# Patient Record
Sex: Female | Born: 1935 | Race: White | Hispanic: No | Smoking: Never smoker
Health system: Southern US, Community
[De-identification: ages and names within clinical notes are randomized; demographics above are authoritative.]

## PROBLEM LIST (undated history)

## (undated) DIAGNOSIS — Z86718 Personal history of other venous thrombosis and embolism: Secondary | ICD-10-CM

## (undated) DIAGNOSIS — I10 Essential (primary) hypertension: Secondary | ICD-10-CM

## (undated) DIAGNOSIS — E785 Hyperlipidemia, unspecified: Secondary | ICD-10-CM

## (undated) DIAGNOSIS — M810 Age-related osteoporosis without current pathological fracture: Secondary | ICD-10-CM

## (undated) DIAGNOSIS — Z86711 Personal history of pulmonary embolism: Secondary | ICD-10-CM

## (undated) DIAGNOSIS — F0390 Unspecified dementia without behavioral disturbance: Secondary | ICD-10-CM

## (undated) DIAGNOSIS — I509 Heart failure, unspecified: Secondary | ICD-10-CM

## (undated) DIAGNOSIS — K219 Gastro-esophageal reflux disease without esophagitis: Secondary | ICD-10-CM

## (undated) HISTORY — PX: TOTAL HIP ARTHROPLASTY: SHX124

---

## 2005-01-21 ENCOUNTER — Inpatient Hospital Stay: Payer: Self-pay | Admitting: General Practice

## 2006-08-24 ENCOUNTER — Ambulatory Visit: Payer: Self-pay | Admitting: Gastroenterology

## 2008-10-15 ENCOUNTER — Inpatient Hospital Stay: Payer: Self-pay | Admitting: Specialist

## 2010-02-11 ENCOUNTER — Inpatient Hospital Stay: Payer: Self-pay | Admitting: Internal Medicine

## 2010-03-28 ENCOUNTER — Ambulatory Visit: Payer: Self-pay | Admitting: Unknown Physician Specialty

## 2010-04-21 ENCOUNTER — Inpatient Hospital Stay: Payer: Self-pay | Admitting: *Deleted

## 2010-04-22 ENCOUNTER — Ambulatory Visit: Payer: Self-pay | Admitting: Internal Medicine

## 2010-04-24 LAB — CANCER ANTIGEN 19-9: CA 19-9: 14 U/mL (ref 0–35)

## 2010-05-06 ENCOUNTER — Ambulatory Visit: Payer: Self-pay | Admitting: Internal Medicine

## 2010-05-18 ENCOUNTER — Ambulatory Visit: Payer: Self-pay | Admitting: Internal Medicine

## 2010-10-28 ENCOUNTER — Encounter: Payer: Self-pay | Admitting: Otolaryngology

## 2010-11-17 ENCOUNTER — Encounter: Payer: Self-pay | Admitting: Otolaryngology

## 2011-05-12 ENCOUNTER — Ambulatory Visit: Payer: Self-pay | Admitting: General Practice

## 2011-05-12 DIAGNOSIS — I10 Essential (primary) hypertension: Secondary | ICD-10-CM

## 2011-05-12 LAB — BASIC METABOLIC PANEL
Anion Gap: 12 (ref 7–16)
BUN: 23 mg/dL — ABNORMAL HIGH (ref 7–18)
Chloride: 106 mmol/L (ref 98–107)
Co2: 27 mmol/L (ref 21–32)
Creatinine: 1.24 mg/dL (ref 0.60–1.30)
Glucose: 103 mg/dL — ABNORMAL HIGH (ref 65–99)
Osmolality: 293 (ref 275–301)
Sodium: 145 mmol/L (ref 136–145)

## 2011-05-12 LAB — CBC
HCT: 36.5 % (ref 35.0–47.0)
MCH: 27.4 pg (ref 26.0–34.0)
MCHC: 32.6 g/dL (ref 32.0–36.0)
MCV: 84 fL (ref 80–100)
Platelet: 238 10*3/uL (ref 150–440)
WBC: 6.6 10*3/uL (ref 3.6–11.0)

## 2011-05-12 LAB — PROTIME-INR
INR: 0.9
Prothrombin Time: 12.6 secs (ref 11.5–14.7)

## 2011-05-12 LAB — SEDIMENTATION RATE: Erythrocyte Sed Rate: 17 mm/hr (ref 0–30)

## 2011-05-25 ENCOUNTER — Inpatient Hospital Stay: Payer: Self-pay | Admitting: General Practice

## 2011-05-26 LAB — BASIC METABOLIC PANEL
Chloride: 109 mmol/L — ABNORMAL HIGH (ref 98–107)
Co2: 26 mmol/L (ref 21–32)
Creatinine: 1.03 mg/dL (ref 0.60–1.30)
EGFR (African American): >60
Potassium: 3.8 mmol/L (ref 3.5–5.1)
Sodium: 142 mmol/L (ref 136–145)

## 2011-05-26 LAB — TSH: Thyroid Stimulating Horm: 5.94 u[IU]/mL — ABNORMAL HIGH

## 2011-05-27 LAB — BASIC METABOLIC PANEL
Anion Gap: 9 (ref 7–16)
BUN: 18 mg/dL (ref 7–18)
Calcium, Total: 7.7 mg/dL — ABNORMAL LOW (ref 8.5–10.1)
Chloride: 111 mmol/L — ABNORMAL HIGH (ref 98–107)
Creatinine: 1.03 mg/dL (ref 0.60–1.30)
EGFR (African American): >60
Osmolality: 284 (ref 275–301)
Potassium: 3.5 mmol/L (ref 3.5–5.1)

## 2011-05-27 LAB — HEMOGLOBIN: HGB: 9.3 g/dL — ABNORMAL LOW (ref 12.0–16.0)

## 2011-10-25 ENCOUNTER — Emergency Department: Payer: Self-pay | Admitting: Unknown Physician Specialty

## 2011-10-25 ENCOUNTER — Ambulatory Visit: Payer: Self-pay | Admitting: Orthopaedic Surgery

## 2011-10-25 LAB — URINALYSIS, COMPLETE
Bilirubin,UR: NEGATIVE
Blood: NEGATIVE
Glucose,UR: NEGATIVE mg/dL (ref 0–75)
Hyaline Cast: 4
Ketone: NEGATIVE
Nitrite: NEGATIVE
Ph: 5 (ref 4.5–8.0)
RBC,UR: 3 /HPF (ref 0–5)
Squamous Epithelial: <1
WBC UR: 13 /HPF (ref 0–5)

## 2011-10-25 LAB — CBC
HCT: 34.1 % — ABNORMAL LOW (ref 35.0–47.0)
HGB: 11 g/dL — ABNORMAL LOW (ref 12.0–16.0)
MCH: 24.8 pg — ABNORMAL LOW (ref 26.0–34.0)
MCV: 77 fL — ABNORMAL LOW (ref 80–100)
Platelet: 241 10*3/uL (ref 150–440)
RDW: 19.6 % — ABNORMAL HIGH (ref 11.5–14.5)
WBC: 7.8 10*3/uL (ref 3.6–11.0)

## 2011-10-25 LAB — COMPREHENSIVE METABOLIC PANEL
Albumin: 3.8 g/dL (ref 3.4–5.0)
Alkaline Phosphatase: 85 U/L (ref 50–136)
Anion Gap: 7 (ref 7–16)
BUN: 31 mg/dL — ABNORMAL HIGH (ref 7–18)
Bilirubin,Total: 0.6 mg/dL (ref 0.2–1.0)
Calcium, Total: 9.2 mg/dL (ref 8.5–10.1)
Creatinine: 1.34 mg/dL — ABNORMAL HIGH (ref 0.60–1.30)
Glucose: 101 mg/dL — ABNORMAL HIGH (ref 65–99)
Osmolality: 284 (ref 275–301)
Potassium: 3.9 mmol/L (ref 3.5–5.1)
Sodium: 139 mmol/L (ref 136–145)
Total Protein: 7.1 g/dL (ref 6.4–8.2)

## 2012-12-16 ENCOUNTER — Emergency Department: Payer: Self-pay | Admitting: Emergency Medicine

## 2013-09-22 ENCOUNTER — Other Ambulatory Visit: Payer: Self-pay | Admitting: Podiatry

## 2013-10-16 LAB — WOUND CULTURE

## 2014-03-23 DIAGNOSIS — E039 Hypothyroidism, unspecified: Secondary | ICD-10-CM | POA: Insufficient documentation

## 2014-03-23 DIAGNOSIS — E785 Hyperlipidemia, unspecified: Secondary | ICD-10-CM | POA: Insufficient documentation

## 2014-03-23 DIAGNOSIS — I10 Essential (primary) hypertension: Secondary | ICD-10-CM | POA: Insufficient documentation

## 2014-06-05 NOTE — Consult Note (Signed)
Brief Consult Note: Diagnosis: Right hip dislocation.   Patient was seen by consultant.   Consult note dictated.   Comments: 81 yo female s/p right THA by Dr. Ernest PineHooten in 2006; this is her second dislocation; first was in 2010; she was just sitting on the edge of her bed trying to get out of bed and felt the hip "slip" then dislocate; brought to ED found to have a posterior right hip dislocation; i was consulted; she denies fever chills or other signs of infection; denies any other truama; she denies open wound or bleeding  Right hip in short and IR post pf / df/ ehl sensation intact to light touch cap refill < 2secs  xray--right posterior hip dislocation  A/P [age] yo female with right hip dislocation s/p THA sedate and reduce here in ED knee immobilizer fu Dr. Ernest PineHooten next week for eval and possible change of brace  posterior hip precautions post reduction.  Electronic Signatures: Francis Gainesalderon, Sayan Aldava D (MD)  (Signed 08-Sep-13 13:20)  Authored: Brief Consult Note   Last Updated: 08-Sep-13 13:20 by Francis Gainesalderon, Tajuana Kniskern D (MD)

## 2014-06-05 NOTE — Consult Note (Signed)
PATIENT NAME:  Megan Donaldson, Megan Donaldson MR#:  [id number] DATE OF BIRTH:  07-01-1927  DATE OF CONSULTATION:  10/25/2011  CONSULTING PHYSICIAN:  Dimas Alexandria. Jeanelle Malling, MD  CHIEF COMPLAINT: Right hip pain.   HISTORY OF PRESENT ILLNESS: The patient is a very pleasant 81 year old female who was in her usual state of health up until earlier this morning when she was trying to get out of bed and suffered what she felt like a slip and a pop in her right hip. She was unable to ambulate and had significant increase in right hip pain. She is status post right total hip arthroplasty done in 2006. She has had one previous dislocation of the right hip in 2010. The hip surgery was done by Dr. Ernest Pine. On evaluation here in the Emergency Department, x-rays were done. She was found to have a posterior superior dislocation of her right total hip arthroplasty, and I was consulted. On discussing with the patient, we discussed the risks, benefits, and alternatives to a closed reduction under conscious sedation. She had an opportunity to ask questions, was given satisfactory answers, and then agreed to a closed reduction.   PAST MEDICAL HISTORY: Significant for: 1. Hypercholesterolemia.  2. GERD. 3. Hypertension.   CURRENT MEDICATIONS: Current medications include Tylenol, Systane, simvastatin, Senna, pantoprazole, oxycodone, metoprolol, Lovenox, levothyroxine, hydrochlorothiazide, Dulcolax, Cipro and a baby aspirin.   ALLERGIES: . She has allergies to lisinopril, latex and tape.   REVIEW OF SYSTEMS: HEENT: She denies blurred vision. She denies hearing loss. CARDIORESPIRATORY: She denies shortness of breath or chest pain. GASTROINTESTINAL: She denies nausea or vomiting. MUSCULOSKELETAL: Review of systems is in accordance with the above noted exceptions.  NEUROLOGICAL: She denies numbness or weakness.   PHYSICAL EXAMINATION:  VITAL SIGNS: She is afebrile and her vital signs are stable.   HEENT: Pupils are equal, round, and  reactive to light. Her mucous membranes are moist.   NECK: Her neck is supple. No JVD. Trachea is midline.   LUNGS: her lungs are clear to auscultation bilaterally.   CARDIOVASCULAR: She has no murmurs, gallops, or rubs. Regular rate and rhythm.   ABDOMEN: She has a soft abdomen, nondistended, nontender.   EXTREMITIES: Examination of the right lower extremity shows a shortened internally rotated right lower extremity with significant pain with any motion about the hip. Her sensation is intact to light touch throughout the right lower extremity. She has positive plantar flexion, dorsiflexion and EHL function in the right lower extremity. She has capillary refill less than 2 seconds. There are no open wounds or skin lesions noted around the hip, and her incision from her previous surgical replacement of her right hip is well healed.   LABORATORY, DIAGNOSTIC AND RADIOLOGICAL DATA: X-rays of the right hip and pelvis demonstrate a posterior superior dislocation of the right total hip arthroplasty. Labs: Her urinalysis is equivocal, questionable presence of a urinary tract infection. Her troponins are negative. CBC: Her white count is 7.8, her hemoglobin and hematocrit is 11/34, and her platelets are 241.0. Metabolic panel: Her sodium is 045, potassium 3.9, chloride 105, bicarbonate 27, BUN 31, and creatinine 1.3.   ASSESSMENT: This is a very pleasant 81 year old female with a right hip dislocation, status post right total hip arthroplasty.   PLAN: We will conduct conscious sedation here in the Emergency Department. I will attempt a closed reduction. We will place her in a right knee immobilizer to try to help prevent hip flexion and repeat dislocation. We will put her on posterior hip precautions,  and she will follow-up with Dr. Ernest PineHooten next week for further evaluation and possible changing of her brace to something better suited for a hip dislocation.   PROCEDURE NOTE: Prior to obtaining verbal and  written consent for the closed reduction of the right hip dislocation, we discussed the risks, benefits, and alternatives. She had an opportunity to ask questions, was given satisfactory answers, and signed all appropriate consents.   The Emergency Department personnel next conducted conscious sedation. Once the patient was adequately sedated, I completed a closed reduction of the right hip with flexion, internal rotation, traction and external rotation. There was a significant pop with the reduction. Post reduction, she had equal leg lengths; and capillary refill remained less than 2 seconds throughout the right lower extremity. A post reduction physical exam is pending her waking up from conscious sedation.   COMPLICATIONS: None. The patient tolerated the procedure well.  ____________________________ Dimas Alexandriaoberto D. Jeanelle Mallingalderon, MD rdc:cbb D: 10/25/2011 13:37:20 ET T: 10/25/2011 14:47:40 ET JOB#: [id number]  cc: Dimas Alexandriaoberto D. Jeanelle Mallingalderon, MD, <Dictator> Francis GainesOBERTO D Croix Presley MD ELECTRONICALLY SIGNED 10/29/2011 13:22

## 2014-06-08 NOTE — Op Note (Signed)
PATIENT NAME:  Megan Donaldson, Megan Donaldson MR#:  [id number] DATE OF BIRTH:  12-24-1927  DATE OF PROCEDURE:  12/16/2012  PREOPERATIVE DIAGNOSIS:  Dislocated right total hip.   POSTOPERATIVE DIAGNOSIS:  Dislocated right total hip.   PROCEDURE:  Closed reduction, right hip.   ANESTHESIA:  IV sedation given by ER physician.   DESCRIPTION OF PROCEDURE:  After patient identification was made, the hip was reduced after sedation given with longitudinal traction with the knee and hip in flexion. An audible and palpable reduction was obtained, and leg lengths appeared nearly equal. Postreduction x-ray pending.   COMPLICATIONS:  None.   SPECIMEN:  None.   ESTIMATED BLOOD LOSS:  None.     ____________________________ Leitha SchullerMichael J. Aowyn Rozeboom, MD mjm:ms D: 12/16/2012 20:41:15 ET T: 12/16/2012 23:13:04 ET JOB#: [id number]  cc: Leitha SchullerMichael J. Virgene Tirone, MD, <Dictator> Leitha SchullerMICHAEL J Chantelle Verdi MD ELECTRONICALLY SIGNED 12/18/2012 14:35

## 2014-06-10 NOTE — Discharge Summary (Signed)
PATIENT NAME:  Megan Donaldson, Megan Donaldson MR#:  [id number] DATE OF BIRTH:  1927-05-01  DATE OF ADMISSION:  05/25/2011 DATE OF DISCHARGE:  05/28/2011  ADMITTING DIAGNOSIS: Degenerative arthrosis of the left knee.   DISCHARGE DIAGNOSES:  1. Degenerative arthrosis of the left knee. 2. Hypothermia. 3. Hypertension.   HISTORY: The patient is an 81 year old who has [facility] for progression of left knee pain. The patient had been experiencing pain to bilateral knees with the left being greater than the right for quite some time. The patient states that the pain was aggravated with prolonged standing. She had gotten to the point where she was avoiding stairs due to the increased discomfort. She was typically using a cane or walker for ambulation due to the severe knee pain prior to surgery. She reported pain both the medial and lateral aspects of the knees. She has also reported crepitus as well as some occasional swelling of the knee, which was felt to be secondary to activity. She has had some episodes of near giving way of the knee. She stated that she no longer had seen any significant improvement in her condition despite use of intra-articular cortisone injections as well as Synvisc injections. She has been unable to take anti-inflammatories due to history of gastritis and GI bleed. The left knee pain had progressed to the point that it was significantly interfering with her activities of daily living. X-rays taken in the clinic showed narrowing of the medial and lateral cartilage space. Osteophytes as well as subchondral sclerosis was noted. After discussion of the risks and benefits of surgical intervention, the patient expressed her understanding of the risks and benefits and agreed with  plans for surgical intervention.   PROCEDURE: Left total knee arthroplasty using computer-assisted navigation.   ANESTHESIA: Femoral nerve block with spinal.   SOFT TISSUE RELEASES: Anterior cruciate  ligament, posterior cruciate ligament, and deep medial collateral ligaments, as well as the patellofemoral ligament.   IMPLANTS UTILIZED: DePuy PFC Sigma size 3 posterior stabilized femoral component (cemented), size 3 MBT tibial component (cemented), 32 mm three pegged oval dome patella (cemented), and a 12.5 mm stabilized rotating platform polyethylene insert.   HOSPITAL COURSE: The patient tolerated the procedure very well. She had no complications. She was then taken to the PAC-U where she was stabilized. She was then transferred to the orthopedic floor. However, once reaching the orthopedic floor the patient became hypotensive and as well as had hypothermia down to as low as 94.3. A warming blanket was applied. She was given a bolus which increased her blood pressure. She was having no chest pain or shortness of breath. No other complaints were noted. The patient began receiving anticoagulation therapy of Lovenox 30 mg subcutaneous every 12 hours per anesthesia protocol. She was fitted with TED stockings bilaterally. These were allowed to be removed one hour per eight hour shift. The left one was applied on day two following removal of the Hemovac and dressing change. She was also fitted with the AV-I compression foot pumps bilaterally set at 80 mmHg. Her calves have been nontender and free of any evidence of any deep venous thromboses. Negative Homans signs. Heels were elevated off the bed using rolled towels. She complained of no tenderness of the heels.   Physical therapy was initiated on day one; however, the first day it was pretty much placed on hold, except for bed exercises due to the patient being a little lightheaded and blood pressure was still a little on  the low side. However, on day two, the patient did have significant improvement with a lot of walking to approximately 180 feet. Upon being discharged, she was ambulating greater than 200 feet. She was able to go up and down four sets of  steps. She was independent with bed to chair transfers. Occupational therapy was also initiated on day one for activities of daily living and assistive devices. Other than the hypothermia and hypotension, this has been an unremarkable hospital course.   The patient's IV, Foley, and Hemovac were all discontinued on day two along with a dressing change. The wound was free of any drainage or any signs of infection. Polar Care was reapplied to the surgical leg maintaining a temperature of 40 to 50 degrees Fahrenheit.   DISPOSITION: The patient is being discharged to home in improved stable condition.   DISCHARGE INSTRUCTIONS:  1. She is to continue weight-bearing as tolerated. Continue using a walker until cleared by physical therapy to go to a quad cane. She will receive home health physical therapy.  2. Continue wearing stockings. These are to be worn during the day but may be removed at night.  3. She is to continue Polar Care maintaining a temperature of 40 to 50 degrees Fahrenheit.  4. She has a follow-up appointment on 07/09/2011. She is to call the clinic sooner for any temperatures of 101.5 or greater or excessive bleeding.  5. She is placed on a regular diet.  6. She is to resume her regular medication that she was on prior to admission. She was given a prescription for oxycodone 1 to 2 tablets every 4 to 6 hours p.r.n. for pain, as well as Ultram 1 to 2 tablets every 4 to 6 hours p.r.n. for pain, and Lovenox 40 mg subcutaneously daily for 14 days and then discontinue and begin taking one 81 mg enteric-coated aspirin per day.   PAST MEDICAL HISTORY:  1. Hyperlipidemia.  2. Hypertension. 3. Thyroid disease.  4. GI bleed in 2011.  5. Pulmonary embolus in 2012. 6. Paroxysmal atrial flutter. 7. Hiatal hernia. 8. Glaucoma. 9. Osteopenia. ____________________________ Van Clines, PA jrw:slb D: 05/28/2011 07:49:19 ET T: 05/29/2011 10:47:39 ET JOB#: [id number]  cc: Van Clines, PA,  <Dictator> [place] PA ELECTRONICALLY SIGNED 05/30/2011 18:46

## 2014-06-10 NOTE — Op Note (Signed)
PATIENT NAME:  Megan Donaldson, Megan Donaldson MR#:  [id number] DATE OF BIRTH:  12-28-1927  DATE OF PROCEDURE:  05/25/2011  PREOPERATIVE DIAGNOSIS: Degenerative arthrosis of the left knee.   POSTOPERATIVE DIAGNOSIS: Degenerative arthrosis of the left knee.   PROCEDURE PERFORMED: Left total knee arthroplasty using computer-assisted navigation.   SURGEON: Illene LabradorJames P. Angie FavaHooten Jr., MD  ASSISTANT: Van ClinesJon Wolfe, PA-C (required to maintain retraction throughout the procedure)   ANESTHESIA: Femoral nerve block and spinal.   ESTIMATED BLOOD LOSS: 150 mL.   FLUIDS REPLACED: 1100 mL of crystalloid.   TOURNIQUET TIME: 74 minutes.   DRAINS: Two medium drains to reinfusion system.   SOFT TISSUE RELEASES: Anterior cruciate ligament, posterior cruciate ligament, deep medial collateral ligament, and patellofemoral ligament.   IMPLANTS UTILIZED: DePuy PFC Sigma size 3 posterior stabilized femoral component (cemented), size 3 MBT tibial component (cemented), 32 mm three peg oval dome patella (cemented), and a 12.5 mm stabilized rotating platform polyethylene insert.   INDICATIONS FOR SURGERY: The patient is an 81 year old female who has been seen for complaints of severe progressive left knee pain. X-rays demonstrated severe degenerative changes in tricompartmental fashion. After discussion of the risks and benefits of surgical intervention, the patient expressed her understanding of the risks and benefits and agreed with plans for surgical intervention.   PROCEDURE IN DETAIL: Patient was brought into the Operating Room and, after adequate femoral nerve block and spinal anesthesia was achieved, a tourniquet was placed on patient's upper left thigh. Patient's left knee and leg were cleaned and prepped with alcohol and DuraPrep, draped in the usual sterile fashion. A "timeout" was performed as per usual protocol. The left lower extremity was exsanguinated using Esmarch, and tourniquet was inflated to 300 mmHg. Anterior longitudinal  incision was made followed by a standard mid vastus approach. A moderate effusion was evacuated. Deep fibers of the medial collateral ligament were elevated in subperiosteal fashion off of the medial flare of the tibia so as to maintain a continuous soft tissue sleeve. Patella was subluxed laterally and the patellofemoral ligament was incised. Inspection of the knee demonstrated severe degenerative changes in tricompartmental fashion. Osteophytes were debrided using rongeur. Anterior and posterior cruciate ligaments were excised. Two 4.0 mm Schanz pins were inserted into the femur and into the tibia for attachment of the ray of spheres used for computer-assisted navigation. Hip center was identified using circumduction technique. Distal landmarks were mapped using computer. Distal femur and proximal tibia were mapped using computer. Distal femoral cutting guide was positioned using computer-assisted navigation so as to achieve a 5 degrees distal valgus cut. Cut was performed and verified using computer. Distal femur was sized and it was felt that a size 3 femoral component was appropriate. Size 3 cutting guide was positioned using computer-assisted navigation and the anterior cut was performed and verified using the computer. This was followed by completion of the posterior and chamfer cuts. Femoral cutting guide for the central box was then positioned and the central box cut was performed.   Attention was then draped in proximal tibia. Medial and lateral menisci were excised. The extramedullary tibial cutting guide was positioned using computer-assisted navigation so as to achieve 0 degrees varus valgus alignment and 0 degrees posterior slope. Cut was performed and verified using the computer. Proximal tibia was sized and it was felt that a size 3 tibial tray was appropriate. Tibial and femoral trials were inserted followed by insertion of first a 10 and subsequently a 12.5 mm polyethylene insert. Excellent  mediolateral soft tissue  balancing was appreciated both in full extension and in 90 degrees of flexion. Finally, patella was cut and prepared so as to accommodate a 32 mm three peg oval dome patella. Patellar trial was placed and the knee was placed through a range of motion with excellent patellar tracking appreciated. Femoral trial was removed. Osteochondral loose bodies were removed from the posterior recesses. Central post hole for the tibial component was reamed followed by insertion of a keel punch. Tibial trial was then removed. Cut surfaces of bone were irrigated with copious amounts of normal saline with antibiotic solution using pulsatile lavage and then suctioned dry. Polymethyl methacrylate cement was prepared using standard vacuum mixing technique. Cement was applied to cut surface of proximal tibia as well as along the undersurface of a size 3 MBT tibial component. Tibial component was positioned and impacted into place. Excess cement was removed using freer elevators. Cement was then applied to cut surface of the femur as well as along the posterior flanges of a size 3 posterior stabilized femoral component. Femoral component was positioned and impacted into place. Excess cement was removed using freer elevators. A 12.5 mm polyethylene trial was inserted and the knee was brought into full extension with steady axial compression applied. Finally, cement was applied to backside of a 32 mm three peg oval dome patella and patellar component was positioned and patellar clamp applied. Excess cement was removed using freer elevators.   After adequate curing of cement, tourniquet was deflated after total tourniquet time of 74 minutes. Hemostasis was achieved using electrocautery. The knee was irrigated with copious amounts of normal saline with antibiotic solution using pulsatile lavage and then suctioned dry. The knee was inspected for any residual cement debris. 30 mL of 0.25% Marcaine with epinephrine  was injected along the posterior capsule. A 12.5 mm stabilized rotating platform polyethylene insert was inserted and the knee was placed through a range of motion with excellent patellar tracking appreciated and excellent mediolateral soft tissue balancing appreciated.   Two medium drains were placed in the wound bed and brought out through a separate stab incision to be attached to reinfusion system. The medial parapatellar portion of the incision was reapproximated using interrupted sutures of #1 Vicryl. Subcutaneous tissue was approximated in layers using first #0 Vicryl followed by 2-0 Vicryl. Skin was closed with skin staples. A sterile dressing was applied.   Patient tolerated procedure well. She was transported to the recovery room in stable condition.   ____________________________ Illene Labrador. Angie Fava., MD jph:cms D: 05/26/2011 06:34:20 ET T: 05/26/2011 10:03:07 ET JOB#: [id number]  cc: Illene Labrador. Angie Fava., MD, <Dictator> JAMES P Angie Fava MD ELECTRONICALLY SIGNED 05/26/2011 20:58

## 2014-06-10 NOTE — Consult Note (Signed)
PATIENT NAME:  Megan PillowHELPS, Atoya M MR#:  [id number] DATE OF BIRTH:  Aug 26, 1927  DATE OF CONSULTATION:  05/26/2011  REFERRING PHYSICIAN:  Dr. Gavin PottersKernodle, orthopedic surgery CONSULTING PHYSICIAN:  Shaune PollackQing Layana Konkel, MD  PRIMARY CARE PHYSICIAN: Dr. Terance HartBronstein   REASON FOR CONSULTATION: Hypotension.  HISTORY OF PRESENT ILLNESS: Patient is an 81 year old Caucasian female with history of osteoarthritis, hypertension, hyperlipidemia, gastrointestinal bleeding, pulmonary embolus and deep vein thrombosis. She was admitted for osteoarthritis, left knee replacement. Patient got surgery left knee replacement yesterday. After operation patient developed hypotension, blood pressure was about 80s to 90s. According to nurse, patient has blood loss about 150 mL during operation and 360 mL from drainage after operation. Dr. Gavin PottersKernodle ordered normal saline 500 mL bolus for hypotension and requested consult. In addition patient has hypothermia. She is being covered with warm blanket now. Patient feels cold and is thirsty but denies any other symptoms.   PAST MEDICAL HISTORY:  1. Osteoarthritis. 2. Hypertension. 3. Hypothyroidism. 4. Hyperlipidemia. 5. Anemia. 6. Pulmonary embolus. 7. Deep vein thrombosis.   PAST SURGICAL HISTORY:  1. Right hip replacement. 2. Right hip reduction due to dislocation. 3. Breast biopsy. 4. Left hip replacement   SOCIAL HISTORY: No smoking or drinking or illicit drugs.   FAMILY HISTORY: Father had myocardial infarction. Mother had ovarian cancer.   ALLERGIES: Tape.   MEDICATIONS:  1. Tylenol 1000 mg IV PB q.6 hours. 2. Tylenol ES 500 to 1000 mg p.o. q.4 hours p.r.n. for pain or fever.  3. Dulcolax suppository 10 mg rectal daily p.r.n. for constipation. 4. Carboxymethylcellulose 1% ophth drops 1 drop both eyes daily p.r.n. for dry eyes.  5. Ancef 2 gram IV PB q.8 hours. 6. Celecoxib capsule 200 mg p.o. b.i.d.   7. Docusate senna 50/8.6 mg p.o. b.i.d.  8. Anzemet injection 12.5 mg IV  push q.6 hours p.r.n. for nausea, vomiting. 9. HCTZ 12.5 mg p.o. daily.  10. Levothyroxine 0.075 mg p.o. 6:00 a.m.  11. Losartan 100 mg p.o. daily. 12. MOM 30 mL p.o. b.i.d. p.r.n. for constipation. 13. Reglan 10 mg p.o. q.i.d.  14. Lopressor 25 mg p.o. b.i.d.  15. Morphine 125 mg IV push every four hours p.r.n.  16. Oxycodone 5 to 10 mg p.o. q.4 hours p.r.n. for pain. 17. Protonix 40 mg p.o. b.i.d.  18. Zocor 40 mg p.o. at bedtime.  19. Tramadol 5o to 100 mg q.4 hours p.r.n. for pain. 20. Lovenox 30 mg subcutaneous q.12 hours.   REVIEW OF SYSTEMS: CONSTITUTIONAL: Patient denies any headache or dizziness or weakness or fatigue. EYES: No blurred vision, double vision. ENT: No ear pain, hearing loss, or epistaxis or discharge. CARDIOVASCULAR: No chest pain, palpitation, orthopnea, or nocturnal dyspnea. No edema. RESPIRATORY: No cough, sputum, shortness of breath or hematemesis. GASTROINTESTINAL: No nausea, vomiting, or diarrhea. No abdominal pain, melena or bloody stool. GENITOURINARY: No dysuria, hematuria, or incontinence. ENDOCRINE: No polyuria or nocturia but the patient feels cold. HEMATOLOGY: No easy bruising or bleeding. NEUROLOGY: No syncope, loss of consciousness or seizure. PSYCH: No anxiety or depression.   PHYSICAL EXAMINATION:  VITALS: Temperature 94.1, blood pressure 89/58, pulse 77, oxygen saturation 98% on room air.   GENERAL: Patient is alert, awake, oriented, in no acute distress.   HEENT: Pupils round, equal, reactive to light and accommodation Moist oral mucosa. Clear oropharynx.   NECK: Supple. No JVD or carotid bruit. No lymphadenopathy. No thyromegaly.   PULMONARY: Bilateral air entry. No wheezing or rales. No use of muscles to breathe.   CARDIOVASCULAR: S1,  S2 regular rate, rhythm. No murmurs, gallops.   ABDOMEN: Soft. No distention or tenderness. No organomegaly. Bowel sounds present.   EXTREMITIES: No edema, clubbing, or cyanosis. No calf tenderness   SKIN: No  rash or jaundice.   NEUROLOGY: Alert and oriented x3. No focal deficit.   LABORATORY, DIAGNOSTIC AND RADIOLOGICAL DATA: No.  IMPRESSION:  1. Hypotension.  2. Hypothermia.   3. Possible dehydration.  4. History of hypothyroidism. 5. Hypertension. 6. Pulmonary embolus.  7. Deep venous thrombosis.  8. Osteoarthritis.  9. Anemia.  RECOMMENDATIONS: Patient is an 81 year old female status post left knee replacement yesterday, developed hypotension. According to patient the patient hadn't eaten anything for the past 1 to 2 days. She feels thirsty and hungry. In addition, patient had blood loss during surgery and after surgery. Patient's hypotension is possibly due to dehydration and blood loss. Patient already got blood transfusion and now is being treated with normal saline bolus. I recommend continue normal saline bolus for now and then if patient's blood pressure is stable we will change to D5 normal saline 100 mL/h and hold hypertension medication. I recommend check CBC, BMP and TSH. Continue warm blanket for hypothermia and TED and Lovenox for DVT prophylaxis. Further recommendation depends on patient's clinical situation and the labs. We will follow up.   Thank you for the consult.   Discussed the patient's situation and recommendation with patient.   TIME SPENT: About 60 minutes.  ____________________________ Shaune Pollack, MD qc:cms D: 05/26/2011 02:48:43 ET T: 05/26/2011 09:11:43 ET JOB#: [id number]  cc: Shaune Pollack, MD, <Dictator> Teena Irani. Terance Hart, MD Shaune Pollack MD ELECTRONICALLY SIGNED 05/28/2011 15:33

## 2014-12-21 ENCOUNTER — Encounter
Admission: EM | Disposition: A | Discharge status: Home / Self Care | Payer: Self-pay | Source: Home / Self Care | Attending: Emergency Medicine

## 2014-12-21 ENCOUNTER — Other Ambulatory Visit: Payer: Self-pay

## 2014-12-21 ENCOUNTER — Emergency Department: Payer: Commercial Managed Care - HMO | Admitting: Anesthesiology

## 2014-12-21 ENCOUNTER — Emergency Department: Payer: Commercial Managed Care - HMO

## 2014-12-21 ENCOUNTER — Encounter: Payer: Self-pay | Admitting: Emergency Medicine

## 2014-12-21 ENCOUNTER — Emergency Department
Admission: EM | Admit: 2014-12-21 | Discharge: 2014-12-21 | Disposition: A | Discharge status: Home / Self Care | Payer: Commercial Managed Care - HMO | Attending: Emergency Medicine | Admitting: Emergency Medicine

## 2014-12-21 DIAGNOSIS — Z91040 Latex allergy status: Secondary | ICD-10-CM | POA: Diagnosis not present

## 2014-12-21 DIAGNOSIS — K219 Gastro-esophageal reflux disease without esophagitis: Secondary | ICD-10-CM | POA: Diagnosis not present

## 2014-12-21 DIAGNOSIS — X509XXA Other and unspecified overexertion or strenuous movements or postures, initial encounter: Secondary | ICD-10-CM | POA: Diagnosis not present

## 2014-12-21 DIAGNOSIS — Y92009 Unspecified place in unspecified non-institutional (private) residence as the place of occurrence of the external cause: Secondary | ICD-10-CM | POA: Diagnosis not present

## 2014-12-21 DIAGNOSIS — X500XXA Overexertion from strenuous movement or load, initial encounter: Secondary | ICD-10-CM | POA: Diagnosis not present

## 2014-12-21 DIAGNOSIS — Y792 Prosthetic and other implants, materials and accessory orthopedic devices associated with adverse incidents: Secondary | ICD-10-CM | POA: Insufficient documentation

## 2014-12-21 DIAGNOSIS — E039 Hypothyroidism, unspecified: Secondary | ICD-10-CM | POA: Insufficient documentation

## 2014-12-21 DIAGNOSIS — Y9389 Activity, other specified: Secondary | ICD-10-CM | POA: Diagnosis not present

## 2014-12-21 DIAGNOSIS — Z96642 Presence of left artificial hip joint: Secondary | ICD-10-CM | POA: Insufficient documentation

## 2014-12-21 DIAGNOSIS — Z9889 Other specified postprocedural states: Secondary | ICD-10-CM

## 2014-12-21 DIAGNOSIS — M25551 Pain in right hip: Secondary | ICD-10-CM | POA: Insufficient documentation

## 2014-12-21 DIAGNOSIS — T84020A Dislocation of internal right hip prosthesis, initial encounter: Secondary | ICD-10-CM | POA: Insufficient documentation

## 2014-12-21 HISTORY — PX: HIP CLOSED REDUCTION: SHX983

## 2014-12-21 HISTORY — DX: Essential (primary) hypertension: I10

## 2014-12-21 LAB — BASIC METABOLIC PANEL
Anion gap: 4 — ABNORMAL LOW (ref 5–15)
BUN: 36 mg/dL — AB (ref 6–20)
CO2: 24 mmol/L (ref 22–32)
CREATININE: 1.33 mg/dL — AB (ref 0.44–1.00)
Calcium: 9 mg/dL (ref 8.9–10.3)
Chloride: 113 mmol/L — ABNORMAL HIGH (ref 101–111)
GFR calc Af Amer: 41 mL/min — ABNORMAL LOW (ref >60)
GFR, EST NON AFRICAN AMERICAN: 35 mL/min — AB (ref >60)
Glucose, Bld: 116 mg/dL — ABNORMAL HIGH (ref 65–99)
Potassium: 3.5 mmol/L (ref 3.5–5.1)
SODIUM: 141 mmol/L (ref 135–145)

## 2014-12-21 LAB — CBC
HCT: 35.9 % (ref 35.0–47.0)
Hemoglobin: 11.9 g/dL — ABNORMAL LOW (ref 12.0–16.0)
MCH: 29.2 pg (ref 26.0–34.0)
MCHC: 33.3 g/dL (ref 32.0–36.0)
MCV: 87.7 fL (ref 80.0–100.0)
PLATELETS: 189 10*3/uL (ref 150–440)
RBC: 4.09 MIL/uL (ref 3.80–5.20)
RDW: 15.1 % — AB (ref 11.5–14.5)
WBC: 5.8 10*3/uL (ref 3.6–11.0)

## 2014-12-21 SURGERY — CLOSED REDUCTION, HIP
Anesthesia: General | Laterality: Right

## 2014-12-21 MED ORDER — ONDANSETRON HCL 4 MG/2ML IJ SOLN
INTRAMUSCULAR | Status: DC | PRN
  Administered 2014-12-21: 4 mg via INTRAVENOUS

## 2014-12-21 MED ORDER — HYDROCODONE-ACETAMINOPHEN 5-325 MG PO TABS: 1.0000 | ORAL_TABLET | Freq: Four times a day (QID) | ORAL | Status: DC | PRN

## 2014-12-21 MED ORDER — ETOMIDATE 2 MG/ML IV SOLN
12.0000 mg | Freq: Once | INTRAVENOUS | Status: AC
  Administered 2014-12-21: 12 mg via INTRAVENOUS

## 2014-12-21 MED ORDER — FENTANYL CITRATE (PF) 100 MCG/2ML IJ SOLN
INTRAMUSCULAR | Status: DC | PRN
  Administered 2014-12-21: 50 ug via INTRAVENOUS

## 2014-12-21 MED ORDER — FENTANYL CITRATE (PF) 100 MCG/2ML IJ SOLN: 25.0000 ug | INTRAMUSCULAR | Status: DC | PRN

## 2014-12-21 MED ORDER — PROPOFOL 10 MG/ML IV BOLUS
INTRAVENOUS | Status: DC | PRN
  Administered 2014-12-21: 80 mg via INTRAVENOUS

## 2014-12-21 MED ORDER — LIDOCAINE HCL (CARDIAC) 20 MG/ML IV SOLN
INTRAVENOUS | Status: DC | PRN
  Administered 2014-12-21: 80 mg via INTRAVENOUS

## 2014-12-21 MED ORDER — LACTATED RINGERS IV SOLN
INTRAVENOUS | Status: DC | PRN
  Administered 2014-12-21: 13:00:00 via INTRAVENOUS

## 2014-12-21 MED ORDER — ONDANSETRON HCL 4 MG/2ML IJ SOLN
4.0000 mg | Freq: Once | INTRAMUSCULAR | Status: AC
  Administered 2014-12-21: 4 mg via INTRAVENOUS
  Filled 2014-12-21: qty 2

## 2014-12-21 MED ORDER — ONDANSETRON HCL 4 MG/2ML IJ SOLN
4.0000 mg | Freq: Once | INTRAMUSCULAR | Status: DC | PRN
Start: 2014-12-21 — End: 2014-12-21

## 2014-12-21 MED ORDER — EPHEDRINE SULFATE 50 MG/ML IJ SOLN
INTRAMUSCULAR | Status: DC | PRN
  Administered 2014-12-21: 10 mg via INTRAVENOUS

## 2014-12-21 MED ORDER — ETOMIDATE 2 MG/ML IV SOLN
INTRAVENOUS | Status: AC
  Administered 2014-12-21: 12 mg via INTRAVENOUS
  Filled 2014-12-21: qty 10

## 2014-12-21 MED ORDER — SUCCINYLCHOLINE CHLORIDE 200 MG/10ML IV SOSY
PREFILLED_SYRINGE | INTRAVENOUS | Status: DC | PRN
  Administered 2014-12-21: 40 mg via INTRAVENOUS

## 2014-12-21 MED ORDER — MIDAZOLAM HCL 2 MG/2ML IJ SOLN
INTRAMUSCULAR | Status: DC | PRN
  Administered 2014-12-21: 1 mg via INTRAVENOUS

## 2014-12-21 MED ORDER — HYDROMORPHONE HCL 1 MG/ML IJ SOLN
1.0000 mg | Freq: Once | INTRAMUSCULAR | Status: AC
  Administered 2014-12-21: 1 mg via INTRAVENOUS
  Filled 2014-12-21: qty 1

## 2014-12-21 SURGICAL SUPPLY — 7 items
GLOVE BIO SURGEON STRL SZ7.5 (GLOVE) IMPLANT
GOWN STRL REUS W/ TWL LRG LVL3 (GOWN DISPOSABLE) IMPLANT
GOWN STRL REUS W/TWL LRG LVL3 (GOWN DISPOSABLE)
IMMBOLIZER KNEE 19 BLUE UNIV (SOFTGOODS) ×3 IMPLANT
KIT RM TURNOVER STRD PROC AR (KITS) ×3 IMPLANT
PACK HIP PROSTHESIS (MISCELLANEOUS) IMPLANT
PILLOW ABDUC SM (MISCELLANEOUS) IMPLANT

## 2014-12-21 NOTE — H&P (Signed)
Megan Donaldson is an 81 y.o. female.   Chief Complaint: Right hip pain HPI: This patient leaned over to put her shoe on this morning and her right hip dislocated for the fourth time.  She had a total hip replacement done in 2006 by Dr. Marry Guan.  She is brought to the emergency room for exam and x-rays showed a dislocated right total hip.  Quickly attempted to reduce this under sedation but was unable to do so.  I'm consulted for further care.  Patient is alert and cooperative and understanding of her problem.  Past Medical History  Diagnosis Date  . Hypertension     Past Surgical History  Procedure Laterality Date  . Total hip arthroplasty Right     x3    History reviewed. No pertinent family history. Social History:  reports that she has never smoked. She does not have any smokeless tobacco history on file. She reports that she does not drink alcohol or use illicit drugs.  Allergies:  Allergies  Allergen Reactions  . Latex      (Not in a hospital admission)  Results for orders placed or performed during the hospital encounter of 12/21/14 (from the past 48 hour(s))  Basic metabolic panel     Status: Abnormal   Collection Time: 12/21/14  9:40 AM  Result Value Ref Range   Sodium 141 135 - 145 mmol/L   Potassium 3.5 3.5 - 5.1 mmol/L   Chloride 113 (H) 101 - 111 mmol/L   CO2 24 22 - 32 mmol/L   Glucose, Bld 116 (H) 65 - 99 mg/dL   BUN 36 (H) 6 - 20 mg/dL   Creatinine, Ser 1.33 (H) 0.44 - 1.00 mg/dL   Calcium 9.0 8.9 - 10.3 mg/dL   GFR calc non Af Amer 35 (L) >60 mL/min   GFR calc Af Amer 41 (L) >60 mL/min    Comment: (NOTE) The eGFR has been calculated using the CKD EPI equation. This calculation has not been validated in all clinical situations. eGFR's persistently <60 mL/min signify possible Chronic Kidney Disease.    Anion gap 4 (L) 5 - 15  CBC     Status: Abnormal   Collection Time: 12/21/14  9:40 AM  Result Value Ref Range   WBC 5.8 3.6 - 11.0 K/uL   RBC 4.09 3.80 -  5.20 MIL/uL   Hemoglobin 11.9 (L) 12.0 - 16.0 g/dL   HCT 35.9 35.0 - 47.0 %   MCV 87.7 80.0 - 100.0 fL   MCH 29.2 26.0 - 34.0 pg   MCHC 33.3 32.0 - 36.0 g/dL   RDW 15.1 (H) 11.5 - 14.5 %   Platelets 189 150 - 440 K/uL   Dg Hip Port Unilat With Pelvis 1v Right  12/21/2014  CLINICAL DATA:  Right hip dislocation. EXAM: DG HIP (WITH OR WITHOUT PELVIS) 1V PORT RIGHT COMPARISON:  Same day. FINDINGS: Status post bilateral total hip arthroplasties. There remains superior and posterior displacement of the right femoral prosthesis relative to the acetabular component. No definite fracture is noted. IMPRESSION: Persistent dislocation of right hip prosthesis. Electronically Signed   By: Marijo Conception, M.D.   On: 12/21/2014 11:58   Dg Hip Unilat  With Pelvis 2-3 Views Right  12/21/2014  CLINICAL DATA:  Acute right hip pain, bilateral hip arthroplasties. Prior dislocations EXAM: DG HIP (WITH OR WITHOUT PELVIS) 2-3V RIGHT COMPARISON:  12/16/2012 FINDINGS: Recurrent dislocation of the right hip femoral prosthesis in relation to the acetabular component. Femoral component  is displaced superiorly on the frontal view and posterior on the cross-table lateral view. Left hip replacement appears intact. Bones are osteopenic. Bony pelvis intact. Degenerative changes of the lower lumbar spine. Pelvic atherosclerosis evident. IMPRESSION: Recurrent dislocation of the right hip prosthesis. Electronically Signed   By: Jerilynn Mages.  Shick M.D.   On: 12/21/2014 10:14    Review of Systems  All other systems reviewed and are negative.   Blood pressure 127/72, pulse 80, temperature 97.6 F (36.4 C), temperature source Oral, resp. rate 20, height _0  (1.626 m), weight 81.647 kg (180 lb), SpO2 96 %. Physical Exam  Constitutional: She is oriented to person, place, and time. She appears well-developed.  HENT:  Head: Normocephalic.  Eyes: Pupils are equal, round, and reactive to light.  Neck: Normal range of motion.  Cardiovascular:  Normal rate.   Respiratory: Effort normal.  Musculoskeletal:  Right leg is short and internally rotated.  His pain with range of motion.  Incisions are well-healed.  Neurovascular status is good.  Left leg is unremarkable to exam.  Neurological: She is alert and oriented to person, place, and time.     Assessment/Plan Posterior dislocation right total hip.  Plan closed reduction under anesthesia in the operating room today.  Plan for the patient to go home afterwards.  Kimberlynn Lumbra E 12/21/2014, 12:31 PM

## 2014-12-21 NOTE — ED Notes (Signed)
Pt with OR tech

## 2014-12-21 NOTE — Anesthesia Procedure Notes (Signed)
Procedure Name: LMA Insertion Date/Time: 12/21/2014 1:16 PM Performed by: Stormy FabianURTIS, Josephina Melcher Pre-anesthesia Checklist: Patient identified, Patient being monitored, Timeout performed, Emergency Drugs available and Suction available Patient Re-evaluated:Patient Re-evaluated prior to inductionOxygen Delivery Method: Circle system utilized Preoxygenation: Pre-oxygenation with 100% oxygen Intubation Type: IV induction Ventilation: Mask ventilation without difficulty LMA: LMA inserted LMA Size: 4.0 Tube type: Oral Number of attempts: 1 Placement Confirmation: positive ETCO2 and breath sounds checked- equal and bilateral Tube secured with: Tape Dental Injury: Teeth and Oropharynx as per pre-operative assessment

## 2014-12-21 NOTE — ED Provider Notes (Signed)
Time Seen: Approximately 0 950  I have reviewed the triage notes  Chief Complaint: Fall and Hip Pain   History of Present Illness: Megan Donaldson is a 81 y.o. female who was at home today and apparently was bending over and had sudden onset of right hip discomfort. He has a known history of previous right hip replacement and is had dislocation before of her prosthetic device. The patient states she knew immediately what happened and had her very brief syncopal episode. Patient states she feels fine at present denies any chest pain or shortness of breath. She denies any abdominal pain, melena or hematochezia. Patient denies any head trauma, headaches or any other concerns.   Past Medical History  Diagnosis Date  . Hypertension     There are no active problems to display for this patient.   Past Surgical History  Procedure Laterality Date  . Total hip arthroplasty Right     x3    Past Surgical History  Procedure Laterality Date  . Total hip arthroplasty Right     x3    Current Outpatient Rx  Name  Route  Sig  Dispense  Refill  . acetaminophen (TYLENOL) 500 MG tablet   Oral   Take 1,000 mg by mouth every morning.         Marland Kitchen. ALPRAZolam (XANAX) 0.5 MG tablet   Oral   Take 0.25-0.5 mg by mouth 2 (two) times daily as needed for anxiety.          . diphenhydramine-acetaminophen (TYLENOL PM) 25-500 MG TABS tablet   Oral   Take 1 tablet by mouth at bedtime.         Marland Kitchen. levothyroxine (SYNTHROID, LEVOTHROID) 75 MCG tablet   Oral   Take 75 mcg by mouth daily.         Marland Kitchen. losartan-hydrochlorothiazide (HYZAAR) 100-12.5 MG tablet   Oral   Take 1 tablet by mouth daily.         . metoprolol tartrate (LOPRESSOR) 25 MG tablet   Oral   Take 25 mg by mouth 2 (two) times daily.         . pantoprazole (PROTONIX) 40 MG tablet   Oral   Take 40 mg by mouth 2 (two) times daily.         . simvastatin (ZOCOR) 40 MG tablet   Oral   Take 40 mg by mouth at bedtime.            Allergies:  Latex  Family History: No family history on file.  Social History: Social History  Substance Use Topics  . Smoking status: Never Smoker   . Smokeless tobacco: None  . Alcohol Use: No     Review of Systems:   10 point review of systems was performed and was otherwise negative:  Constitutional: No fever Eyes: No visual disturbances ENT: No sore throat, ear pain Cardiac: No chest pain Respiratory: No shortness of breath, wheezing, or stridor Abdomen: No abdominal pain, no vomiting, No diarrhea Endocrine: No weight loss, No night sweats Extremities: Right hip pain with movement Skin: No rashes, easy bruising Neurologic: No focal weakness, trouble with speech or swollowing Urologic: No dysuria, Hematuria, or urinary frequency   Physical Exam:  ED Triage Vitals  Enc Vitals Group     BP 12/21/14 0930 120/61 mmHg     Pulse Rate 12/21/14 0916 62     Resp 12/21/14 0916 18     Temp 12/21/14 0916 97.8 F (36.6 C)  Temp Source 12/21/14 0916 Oral     SpO2 12/21/14 0930 99 %     Weight 12/21/14 0916 180 lb (81.647 kg)     Height 12/21/14 0916  (1.626 m)     Head Cir --      Peak Flow --      Pain Score 12/21/14 1014 6     Pain Loc --      Pain Edu? --      Excl. in GC? --     General: Awake , Alert , and Oriented times 3; GCS 15 Head: Normal cephalic , atraumatic Eyes: Pupils equal , round, reactive to light Nose/Throat: No nasal drainage, patent upper airway without erythema or exudate.  Neck: Supple, Full range of motion, No anterior adenopathy or palpable thyroid masses Lungs: Clear to ascultation without wheezes , rhonchi, or rales Heart: Regular rate, regular rhythm without murmurs , gallops , or rubs Abdomen: Soft, non tender without rebound, guarding , or rigidity; bowel sounds positive and symmetric in all 4 quadrants. No organomegaly .        Extremities: Patient's right lower extremity is elongated medially rotated. Right lower  extremity is neurovascularly intact. Neurologic: normal ambulation, Motor symmetric without deficits, sensory intact Skin: warm, dry, no rashes   Labs:   All laboratory work was reviewed including any pertinent negatives or positives listed below:  Labs Reviewed  BASIC METABOLIC PANEL - Abnormal; Notable for the following:    Chloride 113 (*)    Glucose, Bld 116 (*)    BUN 36 (*)    Creatinine, Ser 1.33 (*)    GFR calc non Af Amer 35 (*)    GFR calc Af Amer 41 (*)    Anion gap 4 (*)    All other components within normal limits  CBC - Abnormal; Notable for the following:    Hemoglobin 11.9 (*)    RDW 15.1 (*)    All other components within normal limits  URINALYSIS COMPLETEWITH MICROSCOPIC ([facility] ONLY)    EKG:  ED ECG REPORT I, Jennye Moccasin, the attending physician, personally viewed and interpreted this ECG.  Date: 12/21/2014 EKG Time: 0 920 Rate: 63 Rhythm: normal sinus rhythm QRS Axis: normal Intervals: normal ST/T Wave abnormalities: Nonspecific T wave abnormality Conduction Disutrbances: none Narrative Interpretation: unremarkable No acute ischemic changes  Radiology:  Prior dislocations  EXAM: DG HIP (WITH OR WITHOUT PELVIS) 2-3V RIGHT  COMPARISON: 12/16/2012  FINDINGS: Recurrent dislocation of the right hip femoral prosthesis in relation to the acetabular component. Femoral component is displaced superiorly on the frontal view and posterior on the cross-table lateral view. Left hip replacement appears intact. Bones are osteopenic. Bony pelvis intact. Degenerative changes of the lower lumbar spine. Pelvic atherosclerosis evident.  IMPRESSION: Recurrent dislocation of the right hip prosthesis   I personally reviewed the radiologic studies   Procedures: 1\ Attempted right prosthetic hip relocation After conscious sedation was performed the patient had adequate sedation with attempted having the hip in a flexed and retraction also in a inferior  direction. X-ray was read taken which shows no repositioning at this time of her right hip and remains dislocated   Conscious sedation:  After consent was obtained, alternatives discussed, and risk factors reviewed patient consents for sedation for attempted right prosthetic hip relocation. She has had conscious sedation before. Her airway was examined and appeared to be patent. The patient received Dilaudid IV and Zofran IV prior to conscious sedation medication which was etomidate 12  mg IV push by myself. After adequate sedation was obtained and the patient had attempt at the reduction of the right hip. Total conscious sedation time was 26 minutes of one-on-one care.   ED Course:  The patient's hip remains dislocated at this time and I spoke to the orthopedic surgeon on call Dr. Carole Binning agreed to see and evaluate the patient. Likely overall reduction. The patient appeared to tolerate her conscious sedation well and is observed here in emergency department. Her syncopal episode is likely vagal in nature given it was associated directly with her dislocating her hip and I felt this was unlikely to be cardiogenic syncope. He does appear to be slightly dehydrated with some mild renal insufficiency but otherwise is stable medically.  Assessment: Right prosthetic hip dislocation Conscious sedation Attempted reduction   Final Clinical Impression  Final diagnoses:  Status post closed reduction of dislocated total hip prosthesis     Plan Orthopedic surgery consultation further disposition and management is per their evaluation           Jennye Moccasin, MD 12/21/14 1146

## 2014-12-21 NOTE — Anesthesia Postprocedure Evaluation (Signed)
  Anesthesia Post-op Note  Patient: Megan Donaldson  Procedure(s) Performed: Procedure(s): CLOSED REDUCTION HIP (Right)  Anesthesia type:General  Patient location: PACU  Post pain: Pain level controlled  Post assessment: Post-op Vital signs reviewed, Patient's Cardiovascular Status Stable, Respiratory Function Stable, Patent Airway and No signs of Nausea or vomiting  Post vital signs: Reviewed and stable  Last Vitals:  Filed Vitals:   12/21/14 1456  BP: 119/80  Pulse: 91  Temp:   Resp:     Level of consciousness: awake, alert  and patient cooperative  Complications: No apparent anesthesia complications

## 2014-12-21 NOTE — Transfer of Care (Signed)
Immediate Anesthesia Transfer of Care Note  Patient: Megan PillowRuth M Kasperski  Procedure(s) Performed: Procedure(s): CLOSED REDUCTION HIP (Right)  Patient Location: PACU  Anesthesia Type:General  Level of Consciousness: sedated  Airway & Oxygen Therapy: Patient Spontanous Breathing and Patient connected to face mask oxygen  Post-op Assessment: Report given to RN and Post -op Vital signs reviewed and stable  Post vital signs: Reviewed and stable  Last Vitals:  Filed Vitals:   12/21/14 1340  BP: 104/59  Pulse: 95  Temp: 36.1 C  Resp: 31    Complications: No apparent anesthesia complications

## 2014-12-21 NOTE — H&P (Signed)
THE PATIENT WAS SEEN IN THE HOLDING AREA.  HISTORY, ALLERGIES, HOME MEDICATIONS AND OPERATIVE PROCEDURE WERE REVIEWED. RISKS AND BENEFITS OF SURGERY DISCUSSED WITH PATIENT AGAIN.  NO CHANGES FROM INITIAL HISTORY AND PHYSICAL NOTED.    

## 2014-12-21 NOTE — ED Notes (Signed)
Pt's oxygen saturation down to 88% on room air, pt placed on 2L via [place].

## 2014-12-21 NOTE — ED Notes (Signed)
Pt  From home via [facility] after bending over this morning and having a LOC, reports right hip "popped out"; denies hitting head. Pt with right leg shortened and rotated, reports 3 right hip replacements and multiple dislocations. Strong distal pedal pulse.

## 2014-12-21 NOTE — ED Notes (Signed)
Report given to Shannon, RN

## 2014-12-21 NOTE — Anesthesia Preprocedure Evaluation (Signed)
Anesthesia Evaluation  Patient identified by MRN, date of birth, ID band Patient awake    Reviewed: Allergy & Precautions, NPO status , Patient's Chart, lab work & pertinent test results, reviewed documented beta blocker date and time   Airway Mallampati: III  TM Distance: <3 FB Neck ROM: Limited    Dental  (+) Partial Upper, Missing, Chipped   Pulmonary neg pulmonary ROS,    Pulmonary exam normal breath sounds clear to auscultation       Cardiovascular hypertension, Pt. on medications and Pt. on home beta blockers Normal cardiovascular exam     Neuro/Psych Anxiety negative neurological ROS     GI/Hepatic GERD  Medicated and Controlled,  Endo/Other  Hypothyroidism   Renal/GU   negative genitourinary   Musculoskeletal   Abdominal Normal abdominal exam  (+)   Peds negative pediatric ROS (+)  Hematology   Anesthesia Other Findings   Reproductive/Obstetrics                             Anesthesia Physical Anesthesia Plan  ASA: III  Anesthesia Plan: General   Post-op Pain Management:    Induction: Intravenous and Rapid sequence  Airway Management Planned: Oral ETT and LMA  Additional Equipment:   Intra-op Plan:   Post-operative Plan: Extubation in OR  Informed Consent: I have reviewed the patients History and Physical, chart, labs and discussed the procedure including the risks, benefits and alternatives for the proposed anesthesia with the patient or authorized representative who has indicated his/her understanding and acceptance.   Dental advisory given  Plan Discussed with: CRNA and Surgeon  Anesthesia Plan Comments:         Anesthesia Quick Evaluation

## 2014-12-21 NOTE — Op Note (Signed)
ZOXWRU@ATE@  1:39 PM  PATIENT:  Megan Donaldson    PRE-OPERATIVE DIAGNOSIS:  Right total hip posterior dislocation  POST-OPERATIVE DIAGNOSIS:  Same  PROCEDURE:  CLOSED REDUCTION HIP DISLOCATION   SURGEON:  Valinda HoarMILLER,Kacy Hegna E, MD   ANESTHESIA:   General LMA  PREOPERATIVE INDICATIONS:  Megan Donaldson is a  81 y.o. female with a diagnosis of total hip dislocation who elected for surgical management.    The risks benefits and alternatives were discussed with the patient preoperatively including but not limited to the risks of infection, bleeding, nerve injury, cardiopulmonary complications, the need for revision surgery, among others, and the patient was willing to proceed.  OPERATIVE IMPLANTS: None  OPERATIVE FINDINGS: Dislocated total hip  EBL: None  COMPLICATIONS:   None  OPERATIVE PROCEDURE: The patient underwent satisfactory general anesthesia in the supine position on the operating room table.  The dislocated hip was seen to be short and unstable.  Using manual traction directed distally and anteriorly the hip was reduced with a satisfactory pop.  Range of motion was stable and leg lengths were restored.  The fluoroscopy was used to show that the hip was indeed reduced.  A knee immobilizer was applied to prevent hip and knee flexion.  The patient was awakened and taken to recovery in good condition.  Valinda HoarHoward E Chett Taniguchi, MD

## 2014-12-21 NOTE — Discharge Instructions (Signed)
Use knee immobilizer or hip brace until seen by Dr. Ernest PineHooten.  Use walker to ambulate.  Avoid flexing hip and knee.  Take pain medications as needed.PATIENT INSTRUCTIONS POST-ANESTHESIA  IMMEDIATELY FOLLOWING SURGERY:  Do not drive or operate machinery for the first twenty four hours after surgery.  Do not make any important decisions for twenty four hours after surgery or while taking narcotic pain medications or sedatives.  If you develop intractable nausea and vomiting or a severe headache please notify your doctor immediately.  FOLLOW-UP:  Please make an appointment with your surgeon as instructed. You do not need to follow up with anesthesia unless specifically instructed to do so.  WOUND CARE INSTRUCTIONS (if applicable):  Keep a dry clean dressing on the anesthesia/puncture wound site if there is drainage.  Once the wound has quit draining you may leave it open to air.  Generally you should leave the bandage intact for twenty four hours unless there is drainage.  If the epidural site drains for more than 36-48 hours please call the anesthesia department.  QUESTIONS?:  Please feel free to call your physician or the hospital operator if you have any questions, and they will be happy to assist you.     AMBULATORY SURGERY  DISCHARGE INSTRUCTIONS   1) The drugs that you were given will stay in your system until tomorrow so for the next 24 hours you should not:  A) Drive an automobile B) Make any legal decisions C) Drink any alcoholic beverage   2) You may resume regular meals tomorrow.  Today it is better to start with liquids and gradually work up to solid foods.  You may eat anything you prefer, but it is better to start with liquids, then soup and crackers, and gradually work up to solid foods.   3) Please notify your doctor immediately if you have any unusual bleeding, trouble breathing, redness and pain at the surgery site, drainage, fever, or pain not relieved by  medication.    4) Additional Instructions:        Please contact your physician with any problems or Same Day Surgery at [phone], Monday through Friday 6 am to 4 pm, or [place] at [facility] Main number at [phone].

## 2015-03-19 DIAGNOSIS — Z9889 Other specified postprocedural states: Secondary | ICD-10-CM

## 2015-03-19 DIAGNOSIS — Z96641 Presence of right artificial hip joint: Secondary | ICD-10-CM | POA: Insufficient documentation

## 2015-07-30 DIAGNOSIS — Z86718 Personal history of other venous thrombosis and embolism: Secondary | ICD-10-CM | POA: Insufficient documentation

## 2015-07-30 DIAGNOSIS — Z8719 Personal history of other diseases of the digestive system: Secondary | ICD-10-CM | POA: Insufficient documentation

## 2015-07-30 DIAGNOSIS — Z86711 Personal history of pulmonary embolism: Secondary | ICD-10-CM | POA: Insufficient documentation

## 2015-10-22 DIAGNOSIS — F32A Depression, unspecified: Secondary | ICD-10-CM | POA: Insufficient documentation

## 2015-10-22 DIAGNOSIS — F320 Major depressive disorder, single episode, mild: Secondary | ICD-10-CM | POA: Insufficient documentation

## 2015-10-22 DIAGNOSIS — K219 Gastro-esophageal reflux disease without esophagitis: Secondary | ICD-10-CM | POA: Insufficient documentation

## 2016-01-23 DIAGNOSIS — M810 Age-related osteoporosis without current pathological fracture: Secondary | ICD-10-CM | POA: Insufficient documentation

## 2017-08-07 ENCOUNTER — Encounter: Payer: Self-pay | Admitting: Emergency Medicine

## 2017-08-07 ENCOUNTER — Observation Stay
Admission: EM | Admit: 2017-08-07 | Discharge: 2017-08-08 | Disposition: A | Discharge status: Nursing Home (Includes ICF) | Payer: Medicare Other | Attending: Internal Medicine | Admitting: Internal Medicine

## 2017-08-07 ENCOUNTER — Other Ambulatory Visit: Payer: Self-pay

## 2017-08-07 ENCOUNTER — Emergency Department: Payer: Medicare Other

## 2017-08-07 ENCOUNTER — Inpatient Hospital Stay: Payer: Medicare Other

## 2017-08-07 DIAGNOSIS — Z86718 Personal history of other venous thrombosis and embolism: Secondary | ICD-10-CM | POA: Insufficient documentation

## 2017-08-07 DIAGNOSIS — I509 Heart failure, unspecified: Secondary | ICD-10-CM | POA: Insufficient documentation

## 2017-08-07 DIAGNOSIS — Z79899 Other long term (current) drug therapy: Secondary | ICD-10-CM | POA: Insufficient documentation

## 2017-08-07 DIAGNOSIS — Z880 Allergy status to penicillin: Secondary | ICD-10-CM | POA: Diagnosis not present

## 2017-08-07 DIAGNOSIS — Z96641 Presence of right artificial hip joint: Secondary | ICD-10-CM | POA: Diagnosis not present

## 2017-08-07 DIAGNOSIS — Y92002 Bathroom of unspecified non-institutional (private) residence single-family (private) house as the place of occurrence of the external cause: Secondary | ICD-10-CM | POA: Diagnosis not present

## 2017-08-07 DIAGNOSIS — K219 Gastro-esophageal reflux disease without esophagitis: Secondary | ICD-10-CM | POA: Diagnosis not present

## 2017-08-07 DIAGNOSIS — R569 Unspecified convulsions: Principal | ICD-10-CM

## 2017-08-07 DIAGNOSIS — E213 Hyperparathyroidism, unspecified: Secondary | ICD-10-CM | POA: Diagnosis not present

## 2017-08-07 DIAGNOSIS — G9340 Encephalopathy, unspecified: Secondary | ICD-10-CM | POA: Diagnosis not present

## 2017-08-07 DIAGNOSIS — Z91040 Latex allergy status: Secondary | ICD-10-CM | POA: Diagnosis not present

## 2017-08-07 DIAGNOSIS — I110 Hypertensive heart disease with heart failure: Secondary | ICD-10-CM | POA: Insufficient documentation

## 2017-08-07 DIAGNOSIS — E785 Hyperlipidemia, unspecified: Secondary | ICD-10-CM | POA: Insufficient documentation

## 2017-08-07 DIAGNOSIS — Z8249 Family history of ischemic heart disease and other diseases of the circulatory system: Secondary | ICD-10-CM | POA: Insufficient documentation

## 2017-08-07 DIAGNOSIS — R55 Syncope and collapse: Secondary | ICD-10-CM | POA: Diagnosis present

## 2017-08-07 DIAGNOSIS — W1812XA Fall from or off toilet with subsequent striking against object, initial encounter: Secondary | ICD-10-CM | POA: Diagnosis not present

## 2017-08-07 DIAGNOSIS — Z888 Allergy status to other drugs, medicaments and biological substances status: Secondary | ICD-10-CM | POA: Insufficient documentation

## 2017-08-07 DIAGNOSIS — Y9389 Activity, other specified: Secondary | ICD-10-CM | POA: Diagnosis not present

## 2017-08-07 DIAGNOSIS — M810 Age-related osteoporosis without current pathological fracture: Secondary | ICD-10-CM | POA: Diagnosis not present

## 2017-08-07 HISTORY — DX: Heart failure, unspecified: I50.9

## 2017-08-07 HISTORY — DX: Personal history of other venous thrombosis and embolism: Z86.718

## 2017-08-07 HISTORY — DX: Age-related osteoporosis without current pathological fracture: M81.0

## 2017-08-07 HISTORY — DX: Hyperlipidemia, unspecified: E78.5

## 2017-08-07 HISTORY — DX: Gastro-esophageal reflux disease without esophagitis: K21.9

## 2017-08-07 HISTORY — DX: Personal history of pulmonary embolism: Z86.711

## 2017-08-07 HISTORY — DX: Unspecified dementia, unspecified severity, without behavioral disturbance, psychotic disturbance, mood disturbance, and anxiety: F03.90

## 2017-08-07 LAB — BASIC METABOLIC PANEL
ANION GAP: 11 (ref 5–15)
BUN: 19 mg/dL (ref 6–20)
CHLORIDE: 103 mmol/L (ref 101–111)
CO2: 24 mmol/L (ref 22–32)
Calcium: 9.1 mg/dL (ref 8.9–10.3)
Creatinine, Ser: 1.01 mg/dL — ABNORMAL HIGH (ref 0.44–1.00)
GFR calc non Af Amer: 49 mL/min — ABNORMAL LOW (ref >60)
GFR, EST AFRICAN AMERICAN: 56 mL/min — AB (ref >60)
Glucose, Bld: 164 mg/dL — ABNORMAL HIGH (ref 65–99)
Potassium: 3.2 mmol/L — ABNORMAL LOW (ref 3.5–5.1)
Sodium: 138 mmol/L (ref 135–145)

## 2017-08-07 LAB — URINALYSIS, COMPLETE (UACMP) WITH MICROSCOPIC
BILIRUBIN URINE: NEGATIVE
Bacteria, UA: NONE SEEN
Glucose, UA: NEGATIVE mg/dL
KETONES UR: 5 mg/dL — AB
Leukocytes, UA: NEGATIVE
Nitrite: NEGATIVE
PH: 6 (ref 5.0–8.0)
Protein, ur: NEGATIVE mg/dL
Specific Gravity, Urine: 1.012 (ref 1.005–1.030)

## 2017-08-07 LAB — CBC
HEMATOCRIT: 36 % (ref 35.0–47.0)
HEMOGLOBIN: 12 g/dL (ref 12.0–16.0)
MCH: 28.4 pg (ref 26.0–34.0)
MCHC: 33.3 g/dL (ref 32.0–36.0)
MCV: 85.4 fL (ref 80.0–100.0)
Platelets: 224 10*3/uL (ref 150–440)
RBC: 4.22 MIL/uL (ref 3.80–5.20)
RDW: 15 % — ABNORMAL HIGH (ref 11.5–14.5)
WBC: 11.9 10*3/uL — ABNORMAL HIGH (ref 3.6–11.0)

## 2017-08-07 LAB — TROPONIN I
Troponin I: <0.03 ng/mL (ref <0.03)
Troponin I: <0.03 ng/mL (ref <0.03)

## 2017-08-07 MED ORDER — POLYETHYLENE GLYCOL 3350 17 G PO PACK: 17.0000 g | PACK | Freq: Every day | ORAL | Status: DC | PRN

## 2017-08-07 MED ORDER — RISEDRONATE SODIUM 5 MG PO TABS: 35.0000 mg | ORAL_TABLET | ORAL | Status: DC

## 2017-08-07 MED ORDER — HYDROCHLOROTHIAZIDE 12.5 MG PO CAPS: 12.5000 mg | ORAL_CAPSULE | Freq: Every day | ORAL | Status: DC

## 2017-08-07 MED ORDER — ONDANSETRON HCL 4 MG PO TABS: 4.0000 mg | ORAL_TABLET | Freq: Four times a day (QID) | ORAL | Status: DC | PRN

## 2017-08-07 MED ORDER — ACETAMINOPHEN 500 MG PO TABS
1000.0000 mg | ORAL_TABLET | Freq: Once | ORAL | Status: DC
  Filled 2017-08-07: qty 2

## 2017-08-07 MED ORDER — ACETAMINOPHEN 325 MG PO TABS: 650.0000 mg | ORAL_TABLET | Freq: Four times a day (QID) | ORAL | Status: DC | PRN

## 2017-08-07 MED ORDER — LOSARTAN POTASSIUM-HCTZ 50-12.5 MG PO TABS: 0.5000 | ORAL_TABLET | Freq: Every day | ORAL | Status: DC

## 2017-08-07 MED ORDER — LORAZEPAM 2 MG/ML IJ SOLN
1.0000 mg | Freq: Once | INTRAMUSCULAR | Status: DC
  Filled 2017-08-07: qty 1

## 2017-08-07 MED ORDER — ACETAMINOPHEN 650 MG RE SUPP: 650.0000 mg | Freq: Four times a day (QID) | RECTAL | Status: DC | PRN

## 2017-08-07 MED ORDER — SODIUM CHLORIDE 0.9% FLUSH
3.0000 mL | Freq: Two times a day (BID) | INTRAVENOUS | Status: DC
  Administered 2017-08-07 – 2017-08-08 (×2): 3 mL via INTRAVENOUS

## 2017-08-07 MED ORDER — LOSARTAN POTASSIUM 50 MG PO TABS
50.0000 mg | ORAL_TABLET | Freq: Every day | ORAL | Status: DC
  Administered 2017-08-08: 11:00:00 50 mg via ORAL
  Filled 2017-08-07: qty 1

## 2017-08-07 MED ORDER — ENOXAPARIN SODIUM 40 MG/0.4ML ~~LOC~~ SOLN
40.0000 mg | SUBCUTANEOUS | Status: DC
  Administered 2017-08-07: 19:00:00 40 mg via SUBCUTANEOUS
  Filled 2017-08-07: qty 0.4

## 2017-08-07 MED ORDER — ALBUTEROL SULFATE (2.5 MG/3ML) 0.083% IN NEBU: 2.5000 mg | INHALATION_SOLUTION | RESPIRATORY_TRACT | Status: DC | PRN

## 2017-08-07 MED ORDER — LEVETIRACETAM IN NACL 500 MG/100ML IV SOLN
500.0000 mg | Freq: Two times a day (BID) | INTRAVENOUS | Status: DC
  Administered 2017-08-07 – 2017-08-08 (×2): 500 mg via INTRAVENOUS
  Filled 2017-08-07 (×4): qty 100
  Filled 2017-08-07: qty 500
  Filled 2017-08-07: qty 5

## 2017-08-07 MED ORDER — LORAZEPAM 2 MG/ML IJ SOLN
INTRAMUSCULAR | Status: AC
  Filled 2017-08-07: qty 1

## 2017-08-07 MED ORDER — GADOBENATE DIMEGLUMINE 529 MG/ML IV SOLN
17.0000 mL | Freq: Once | INTRAVENOUS | Status: AC | PRN
  Administered 2017-08-07: 18:00:00 17 mL via INTRAVENOUS

## 2017-08-07 MED ORDER — LEVOTHYROXINE SODIUM 50 MCG PO TABS
75.0000 ug | ORAL_TABLET | Freq: Every day | ORAL | Status: DC
  Administered 2017-08-08: 11:00:00 75 ug via ORAL
  Filled 2017-08-07 (×2): qty 2

## 2017-08-07 MED ORDER — METOPROLOL TARTRATE 25 MG PO TABS
25.0000 mg | ORAL_TABLET | Freq: Two times a day (BID) | ORAL | Status: DC
  Administered 2017-08-08: 11:00:00 25 mg via ORAL
  Filled 2017-08-07: qty 1

## 2017-08-07 MED ORDER — PANTOPRAZOLE SODIUM 40 MG PO TBEC
40.0000 mg | DELAYED_RELEASE_TABLET | Freq: Two times a day (BID) | ORAL | Status: DC
  Administered 2017-08-08: 11:00:00 40 mg via ORAL
  Filled 2017-08-07: qty 1

## 2017-08-07 MED ORDER — ESCITALOPRAM OXALATE 10 MG PO TABS
10.0000 mg | ORAL_TABLET | Freq: Every day | ORAL | Status: DC
  Administered 2017-08-08: 10 mg via ORAL
  Filled 2017-08-07 (×2): qty 1

## 2017-08-07 MED ORDER — ONDANSETRON HCL 4 MG/2ML IJ SOLN: 4.0000 mg | Freq: Four times a day (QID) | INTRAMUSCULAR | Status: DC | PRN

## 2017-08-07 MED ORDER — SIMVASTATIN 40 MG PO TABS
40.0000 mg | ORAL_TABLET | Freq: Every day | ORAL | Status: DC
  Filled 2017-08-07 (×2): qty 1

## 2017-08-07 MED ORDER — POTASSIUM CHLORIDE IN NACL 40-0.9 MEQ/L-% IV SOLN
INTRAVENOUS | Status: DC
  Administered 2017-08-07: 21:00:00 50 mL/h via INTRAVENOUS
  Filled 2017-08-07 (×3): qty 1000

## 2017-08-07 MED ORDER — SODIUM CHLORIDE 0.9% FLUSH: 3.0000 mL | INTRAVENOUS | Status: DC | PRN

## 2017-08-07 MED ORDER — LEVETIRACETAM IN NACL 1000 MG/100ML IV SOLN
1000.0000 mg | Freq: Once | INTRAVENOUS | Status: AC
  Administered 2017-08-07: 1000 mg via INTRAVENOUS
  Filled 2017-08-07: qty 100

## 2017-08-07 NOTE — Progress Notes (Signed)
Pt admitted from ED with somnolent/"post ictal state with MD aware and confirms NPO with no IVF's at this time. VSS. Pt went to MRI shortly after arrival. TC with dgt,Barbara with admission done with advanced directives/HPOA papers requested.

## 2017-08-07 NOTE — ED Notes (Signed)
Patient can open eyes to command. Pharmacy called for Keppra. Patient is moving arms and hands, picking at the blankets. Color is back to baseline. Patient placed on 2L O2 due to sats in the high 80's.

## 2017-08-07 NOTE — ED Notes (Signed)
Patient is back from CT scan. Posey alarm in place.

## 2017-08-07 NOTE — ED Notes (Signed)
Pads placed on the floor.

## 2017-08-07 NOTE — ED Notes (Signed)
Per daughter's who is the HPOA states patient is DNR. Daughter states patient lost her husband one week ago. Patient's daughter is Barton FannyBarbara Kassmann, [phone].

## 2017-08-07 NOTE — ED Notes (Addendum)
Patient was brought back to room by CT tech and became unresponsive. Dr. Roxan Hockeyobinson at bedside. Patient is being bagged by ambu bag. Patient has snoring respirations and is pale in color. Radial pulse is present and bounding.

## 2017-08-07 NOTE — ED Notes (Signed)
Patient taken to CT scan.

## 2017-08-07 NOTE — ED Notes (Signed)
Dried blood removed from patient's lips with lubricant. NAD.

## 2017-08-07 NOTE — Progress Notes (Signed)
Purpose of Encounter discuss acute hospitalization. Code status discussion  Parties in Attendance patient is nonverbal, daughter Lucie LeatherBarbara Kassman  Patients Decisional capacity patient has dementia and also in postictal state. Unable to participate in discussion.  Discussed with patient's daughter Lucie LeatherBarbara Kassman who is her health care power of attorney. We discussed regarding patience seizure and plan of treatment. Discussed regarding code status of CPR intubation. Daughter tells me that patient is or do not resuscitate and cannot intubate. Orders entered.   Time spent - 17 minutes

## 2017-08-07 NOTE — ED Provider Notes (Signed)
[facility] Emergency Department Provider Note    First MD Initiated Contact with Patient 08/07/17 1020     (approximate)  I have reviewed the triage vital signs and the nursing notes.   HISTORY  Chief Complaint Fall and Dizziness    HPI Megan Donaldson is a 84 y.o. female no recent hospitalizations presents to the ER after she had a fall while she was in the bathroom on toilet.  Patient denies any chest pain or shortness of breath.  States that she did hit her head.  She does not recall any LOC.  States that she does have some mild back pain and pain to the back of her head where she hit the ground.  States that she was in a hurry this morning to get changed.  Denies any nausea or vomiting.  No numbness or tingling.  Echo 2/19:  INTERPRETATION NORMAL LEFT VENTRICULAR SYSTOLIC FUNCTION NORMAL RIGHT VENTRICULAR SYSTOLIC FUNCTION MILD VALVULAR REGURGITATION (See above) NO VALVULAR STENOSIS POOR SOUND TRANSMISSION No prior study available for comparison  Past Medical History:  Diagnosis Date  . CHF (congestive heart failure) (HCC)   . Dementia   . GERD (gastroesophageal reflux disease)   . Hyperlipidemia   . Hypertension   . Osteoporosis   . Personal history of other venous thrombosis and embolism   . Personal history of PE (pulmonary embolism)    Family History  Problem Relation Age of Onset  . CAD Father    Past Surgical History:  Procedure Laterality Date  . HIP CLOSED REDUCTION Right 12/21/2014   Procedure: CLOSED REDUCTION HIP;  Surgeon: Deeann Saint, MD;  Location: [facility] ORS;  Service: Orthopedics;  Laterality: Right;  . TOTAL HIP ARTHROPLASTY Right    x3   There are no active problems to display for this patient.     Prior to Admission medications   Medication Sig Start Date End Date Taking? Authorizing Provider  acetaminophen (TYLENOL) 500 MG tablet Take 1,000 mg by mouth daily as needed for mild pain.    Yes [provider]  diphenhydramine-acetaminophen (TYLENOL PM) 25-500 MG TABS tablet Take 1 tablet by mouth at bedtime.   Yes [provider]  escitalopram (LEXAPRO) 10 MG tablet Take 10 mg by mouth daily. 07/21/17  Yes [provider]  levothyroxine (SYNTHROID, LEVOTHROID) 75 MCG tablet Take 75 mcg by mouth daily.   Yes [provider]  losartan-hydrochlorothiazide (HYZAAR) 50-12.5 MG tablet Take 0.5 tablets by mouth daily. 06/01/17  Yes [provider]  metoprolol tartrate (LOPRESSOR) 25 MG tablet Take 25 mg by mouth 2 (two) times daily.   Yes [provider]  nystatin cream (MYCOSTATIN) Apply 1 application topically 2 (two) times daily. 07/13/17  Yes [provider]  pantoprazole (PROTONIX) 40 MG tablet Take 40 mg by mouth 2 (two) times daily.   Yes [provider]  risedronate (ACTONEL) 35 MG tablet Take 35 mg by mouth every 7 (seven) days. Take with full glass of water. Do not lie down for the next 30 minutes. 07/15/17  Yes [provider]  simvastatin (ZOCOR) 40 MG tablet Take 40 mg by mouth at bedtime.   Yes [provider]  HYDROcodone-acetaminophen (NORCO) 5-325 MG tablet Take 1-2 tablets by mouth every 6 (six) hours as needed. Patient not taking: Reported on 08/07/2017 12/21/14   Deeann Saint, MD    Allergies Amoxicillin; Latex; and Lisinopril    Social History Social History   Tobacco Use  .  Smoking status: Never Smoker  Substance Use Topics  . Alcohol use: No  . Drug use: No    Review of Systems Patient denies headaches, rhinorrhea, blurry vision, numbness, shortness of breath, chest pain, edema, cough, abdominal pain, nausea, vomiting, diarrhea, dysuria, fevers, rashes or hallucinations unless otherwise stated above in HPI. ____________________________________________   PHYSICAL EXAM:  VITAL SIGNS: Vitals:   08/07/17 1528 08/07/17 1538  BP: (!) 154/90 136/61  Pulse: (!) 48 91  Resp: (!) 22 (!) 22  Temp:     SpO2: 94% 93%    Constitutional: Alert and oriented.  Eyes: Conjunctivae are normal.  Head: Atraumatic. Nose: No congestion/rhinnorhea. Mouth/Throat: contusion to lower lip and tongueMucous membranes are moist.   Neck: No stridor. Painless ROM.  Cardiovascular: Normal rate, regular rhythm. Grossly normal heart sounds.  Good peripheral circulation. Respiratory: Normal respiratory effort.  No retractions. Lungs CTAB. Gastrointestinal: Soft and nontender. No distention. No abdominal bruits. No CVA tenderness. Genitourinary: deferred Musculoskeletal: No lower extremity tenderness nor edema.  No joint effusions. Neurologic:  Normal speech and language. No gross focal neurologic deficits are appreciated. No facial droop Skin:  Skin is warm, dry and intact. No rash noted. Psychiatric:  Speech and behavior are normal.  ____________________________________________   LABS (all labs ordered are listed, but only abnormal results are displayed)  Results for orders placed or performed during the hospital encounter of 08/07/17 (from the past 24 hour(s))  Basic metabolic panel     Status: Abnormal   Collection Time: 08/07/17 10:13 AM  Result Value Ref Range   Sodium 138 135 - 145 mmol/L   Potassium 3.2 (L) 3.5 - 5.1 mmol/L   Chloride 103 101 - 111 mmol/L   CO2 24 22 - 32 mmol/L   Glucose, Bld 164 (H) 65 - 99 mg/dL   BUN 19 6 - 20 mg/dL   Creatinine, Ser 1.61 (H) 0.44 - 1.00 mg/dL   Calcium 9.1 8.9 - 09.6 mg/dL   GFR calc non Af Amer 49 (L) >60 mL/min   GFR calc Af Amer 56 (L) >60 mL/min   Anion gap 11 5 - 15  CBC     Status: Abnormal   Collection Time: 08/07/17 10:13 AM  Result Value Ref Range   WBC 11.9 (H) 3.6 - 11.0 K/uL   RBC 4.22 3.80 - 5.20 MIL/uL   Hemoglobin 12.0 12.0 - 16.0 g/dL   HCT 04.5 40.9 - 81.1 %   MCV 85.4 80.0 - 100.0 fL   MCH 28.4 26.0 - 34.0 pg   MCHC 33.3 32.0 - 36.0 g/dL   RDW 91.4 (H) 78.2 - 95.6 %   Platelets 224 150 - 440 K/uL  Troponin I     Status:  None   Collection Time: 08/07/17 10:13 AM  Result Value Ref Range   Troponin I <0.03 <0.03 ng/mL  Urinalysis, Complete w Microscopic     Status: Abnormal   Collection Time: 08/07/17 12:39 PM  Result Value Ref Range   Color, Urine YELLOW (A) YELLOW   APPearance CLEAR (A) CLEAR   Specific Gravity, Urine 1.012 1.005 - 1.030   pH 6.0 5.0 - 8.0   Glucose, UA NEGATIVE NEGATIVE mg/dL   Hgb urine dipstick MODERATE (A) NEGATIVE   Bilirubin Urine NEGATIVE NEGATIVE   Ketones, ur 5 (A) NEGATIVE mg/dL   Protein, ur NEGATIVE NEGATIVE mg/dL   Nitrite NEGATIVE NEGATIVE   Leukocytes, UA NEGATIVE NEGATIVE   RBC / HPF 6-10 0 - 5 RBC/hpf   WBC,  UA 0-5 0 - 5 WBC/hpf   Bacteria, UA NONE SEEN NONE SEEN   Squamous Epithelial / LPF 0-5 0 - 5   Mucus PRESENT    Hyaline Casts, UA PRESENT   Troponin I     Status: None   Collection Time: 08/07/17  1:29 PM  Result Value Ref Range   Troponin I <0.03 <0.03 ng/mL   ____________________________________________  EKG My review and personal interpretation at Time: 10:08   Indication: weakness  Rate: 70  Rhythm: sinus Axis: left Other: normal intervals, no stemi ____________________________________________  RADIOLOGY  I personally reviewed all radiographic images ordered to evaluate for the above acute complaints and reviewed radiology reports and findings.  These findings were personally discussed with the patient.  Please see medical record for radiology report.  ____________________________________________   PROCEDURES  Procedure(s) performed:  Procedures    Critical Care performed: no ____________________________________________   INITIAL IMPRESSION / ASSESSMENT AND PLAN / ED COURSE  Pertinent labs & imaging results that were available during my care of the patient were reviewed by me and considered in my medical decision making (see chart for details).   DDX: Dehydration, sepsis, pna, uti, hypoglycemia, cva, seizure   Megan Donaldson is a  84 y.o. who presents to the ED with fall and syncopal event as described above patient currently nontoxic and hemodynamically stable.  Blood work sent for the above differential was reassuring.  Mild like leukocytosis.  No evidence of AKI or Y dyscrasia.  Chest x-ray showed no evidence of pneumothorax or consolidation.  CT head shows no evidence of acute intracranial abnormality.  The patient will be placed on continuous pulse oximetry and telemetry for monitoring.  Laboratory evaluation will be sent to evaluate for the above complaints.     Clinical Course as of Aug 08 1543  Sat Aug 07, 2017  1346 Family reports that the patient just buried her husband less than 2 weeks ago and has been struggling with grieving.  Per family that we are just waiting on the repeat troponin.  Blood work otherwise appearing normal thus far and reassuring.   [PR]  1519 Called emergently to the patient's room after she was returning from lumbar spine film.  Patient found to be with sonorous respirations with leftward gaze palsy.  Patient very pale.  Jaw thrust was initiated she was initially hypoxic to 81% and by using bag-valve-mask were able to increase oxygenation 100%.  Presentation concerning for seizure as patient currently postictal.  Moving all extremities spontaneously.  Does localize to pain.  Will load with IV Keppra.  Patient will require hospitalization.   [PR]  1544 Patient reassessed.  Still slightly postictal but is following commands.  Opening her eyes to voice and tracking.  Patient receiving IV Keppra.  Spoke with hospitalist who kindly agreed to admit patient for further evaluation management.   [PR]    Clinical Course User Index [PR] Willy Eddyobinson, Breawna Montenegro, MD     As part of my medical decision making, I reviewed the following data within the electronic MEDICAL RECORD NUMBER Nursing notes reviewed and incorporated, Labs reviewed, notes from prior ED visits and [place] Controlled Substance  Database   ____________________________________________   FINAL CLINICAL IMPRESSION(S) / ED DIAGNOSES  Final diagnoses:  Syncope and collapse  Seizure (HCC)      NEW MEDICATIONS STARTED DURING THIS VISIT:  New Prescriptions   No medications on file     Note:  This document was prepared using Dragon voice recognition software  and may include unintentional dictation errors.    Willy Eddy, MD 08/07/17 [phone]

## 2017-08-07 NOTE — H&P (Signed)
[facility] - [place] [facility]   PATIENT NAME: Megan Donaldson    MR#:  [id number]  DATE OF BIRTH:  1927/08/09  DATE OF ADMISSION:  08/07/2017  PRIMARY CARE PHYSICIAN: Dorothey BasemanBronstein, David, MD   REQUESTING/REFERRING PHYSICIAN: Dr. Roxan Hockeyobinson  CHIEF COMPLAINT:   Chief Complaint  Patient presents with  . Fall  . Dizziness    HISTORY OF PRESENT ILLNESS:  Megan ShihRuth Donaldson  is a 84 y.o. female with a known history of dementia, recurrent syncopal episodes, hypertension presented to the emergency room after she was found on the floor. Patient had a CT scan of the head in the emergency room which was normal. Plan was to discharge patient back as she had workup for syncope in the past. After this she had a witness seizure where her eyes were rolled to the left and she had 20 clonic movement of the left arm. Postictal state. History obtained from ED staff in all records. Family not available at bedside.  PAST MEDICAL HISTORY:   Past Medical History:  Diagnosis Date  . CHF (congestive heart failure) (HCC)   . Dementia   . GERD (gastroesophageal reflux disease)   . Hyperlipidemia   . Hypertension   . Osteoporosis   . Personal history of other venous thrombosis and embolism   . Personal history of PE (pulmonary embolism)     PAST SURGICAL HISTORY:   Past Surgical History:  Procedure Laterality Date  . HIP CLOSED REDUCTION Right 12/21/2014   Procedure: CLOSED REDUCTION HIP;  Surgeon: Deeann SaintHoward Miller, MD;  Location: [facility] ORS;  Service: Orthopedics;  Laterality: Right;  . TOTAL HIP ARTHROPLASTY Right    x3    SOCIAL HISTORY:   Social History   Tobacco Use  . Smoking status: Never Smoker  Substance Use Topics  . Alcohol use: No    FAMILY HISTORY:   Family History  Problem Relation Age of Onset  . CAD Father     DRUG ALLERGIES:   Allergies  Allergen Reactions  . Amoxicillin     Has patient had a PCN reaction causing immediate rash, facial/tongue/throat swelling,  SOB or lightheadedness with hypotension: Unknown Has patient had a PCN reaction causing severe rash involving mucus membranes or skin necrosis: Unknown Has patient had a PCN reaction that required hospitalization: Unknown Has patient had a PCN reaction occurring within the last 10 years: Unknown If all of the above answers are "NO", then may proceed with Cephalosporin use.   . Latex   . Lisinopril     REVIEW OF SYSTEMS:   Review of Systems  Unable to perform ROS: Dementia    MEDICATIONS AT HOME:   Prior to Admission medications   Medication Sig Start Date End Date Taking? Authorizing Provider  acetaminophen (TYLENOL) 500 MG tablet Take 1,000 mg by mouth daily as needed for mild pain.    Yes [provider]  diphenhydramine-acetaminophen (TYLENOL PM) 25-500 MG TABS tablet Take 1 tablet by mouth at bedtime.   Yes [provider]  escitalopram (LEXAPRO) 10 MG tablet Take 10 mg by mouth daily. 07/21/17  Yes [provider]  levothyroxine (SYNTHROID, LEVOTHROID) 75 MCG tablet Take 75 mcg by mouth daily.   Yes [provider]  losartan-hydrochlorothiazide (HYZAAR) 50-12.5 MG tablet Take 0.5 tablets by mouth daily. 06/01/17  Yes [provider]  metoprolol tartrate (LOPRESSOR) 25 MG tablet Take 25 mg by mouth 2 (two) times daily.   Yes [provider]  nystatin cream (MYCOSTATIN) Apply 1  application topically 2 (two) times daily. 07/13/17  Yes [provider]  pantoprazole (PROTONIX) 40 MG tablet Take 40 mg by mouth 2 (two) times daily.   Yes [provider]  risedronate (ACTONEL) 35 MG tablet Take 35 mg by mouth every 7 (seven) days. Take with full glass of water. Do not lie down for the next 30 minutes. 07/15/17  Yes [provider]  simvastatin (ZOCOR) 40 MG tablet Take 40 mg by mouth at bedtime.   Yes [provider]  HYDROcodone-acetaminophen (NORCO) 5-325 MG tablet Take 1-2 tablets by mouth every 6 (six)  hours as needed. Patient not taking: Reported on 08/07/2017 12/21/14   Deeann Saint, MD     VITAL SIGNS:  Blood pressure 136/61, pulse 91, temperature 97.9 F (36.6 C), temperature source Oral, resp. rate (!) 22, height 5\' 6"  (1.676 m), weight 81.6 kg (180 lb), SpO2 93 %.  PHYSICAL EXAMINATION:  Physical Exam  GENERAL:  84 y.o.-year-old patient lying in the bed  EYES: Pupils equal, round, reactive to light and accommodation. No scleral icterus. Extraocular muscles intact.  HEENT: Head atraumatic, normocephalic. Oropharynx and nasopharynx clear. No oropharyngeal erythema, moist oral mucosa  NECK:  Supple, no jugular venous distention. No thyroid enlargement, no tenderness.  LUNGS: Normal breath sounds bilaterally, no wheezing, rales, rhonchi. No use of accessory muscles of respiration.  CARDIOVASCULAR: S1, S2 normal. No murmurs, rubs, or gallops.  ABDOMEN: Soft, nontender, nondistended. Bowel sounds present. No organomegaly or mass.  EXTREMITIES: No pedal edema, cyanosis, or clubbing. + 2 pedal & radial pulses b/l.   NEUROLOGIC: not following commands. PSYCHIATRIC: The patient is drowsy SKIN: No obvious rash, lesion, or ulcer.   LABORATORY PANEL:   CBC Recent Labs  Lab 08/07/17 1013  WBC 11.9*  HGB 12.0  HCT 36.0  PLT 224   ------------------------------------------------------------------------------------------------------------------  Chemistries  Recent Labs  Lab 08/07/17 1013  NA 138  K 3.2*  CL 103  CO2 24  GLUCOSE 164*  BUN 19  CREATININE 1.01*  CALCIUM 9.1   ------------------------------------------------------------------------------------------------------------------  Cardiac Enzymes Recent Labs  Lab 08/07/17 1329  TROPONINI <0.03   ------------------------------------------------------------------------------------------------------------------  RADIOLOGY:  Dg Lumbar Spine 2-3 Views  Result Date: 08/07/2017 CLINICAL DATA:  Lower back pain  after fall. EXAM: LUMBAR SPINE - 2-3 VIEW COMPARISON:  None available currently. FINDINGS: Moderate levoscoliosis of lower thoracic and upper lumbar spine is noted. No fracture or spondylolisthesis of lumbar spine is noted. Severe degenerative disc disease is noted at L1-2, L2-3 and L3-4. Moderate degenerative disc disease is noted at L4-5 and L5-S1. Atherosclerosis of abdominal aorta is noted. Mild lateral subluxation of L3 on L4 to the left is noted. IMPRESSION: Severe multilevel degenerative disc disease. No acute abnormality seen in the lumbar spine. Aortic Atherosclerosis (ICD10-I70.0). Electronically Signed   By: Lupita Raider, M.D.   On: 08/07/2017 15:44   Ct Head Wo Contrast  Result Date: 08/07/2017 CLINICAL DATA:  Dizziness and fall. EXAM: CT HEAD WITHOUT CONTRAST TECHNIQUE: Contiguous axial images were obtained from the base of the skull through the vertex without intravenous contrast. COMPARISON:  CT head dated February 11, 2010. FINDINGS: Brain: No evidence of acute infarction, hemorrhage, hydrocephalus, extra-axial collection or mass lesion/mass effect. Relatively stable mild atrophy and chronic microvascular ischemic changes. Vascular: Calcified atherosclerosis at the skullbase. No hyperdense vessel. Skull: Normal. Negative for fracture or focal lesion. Sinuses/Orbits: No acute finding. Other: None. IMPRESSION: 1. No acute intracranial abnormality. Stable atrophy and chronic microvascular ischemic changes. Electronically Signed  By: Obie Dredge M.D.   On: 08/07/2017 12:14   Dg Chest Portable 1 View  Result Date: 08/07/2017 CLINICAL DATA:  Dizziness and fall.  Mid back pain. EXAM: PORTABLE CHEST 1 VIEW COMPARISON:  10/25/2011 FINDINGS: Lungs are clear. Cardiomediastinal silhouette and remainder of the exam is unchanged. IMPRESSION: No acute findings. Electronically Signed   By: Elberta Fortis M.D.   On: 08/07/2017 10:56     IMPRESSION AND PLAN:   *Recurrent seizures with  falls within a seizure in the emergency room. Start Keppra IV. CT scan of the head showed nothing acute. Will check MRI of the brain with and without contrast. Seizure precautions. Consult neurology.  *Acute encephalopathy, postictal state should improve. Monitor.  *Hypertension. Continue home medications once patient is able to swallow.  * DVT prophylaxis with Lovenox  All the records are reviewed and case discussed with ED provider. Management plans discussed with the patient, family and they are in agreement.  CODE STATUS: DNR  TOTAL TIME TAKING CARE OF THIS PATIENT: 35 minutes.   Molinda Bailiff Otelia Hettinger M.D on 08/07/2017 at 4:01 PM  Between 7am to 6pm - Pager - [phone]  After 6pm go to [email or web address] - password [email or web address]  [facility]  Office  [phone]  CC: Primary care physician; Dorothey Baseman, MD  Note: This dictation was prepared with Dragon dictation along with smaller phrase technology. Any transcriptional errors that result from this process are unintentional.

## 2017-08-07 NOTE — ED Triage Notes (Signed)
Pt presents to ED via [facility] with c/o dizziness and fall. Per EMS pt was found in the floor partially in and out of shower. Pt states "I was trying to get dressed for breakfast but was running late". Pt c/o mid back pain and pain to back of her head. EMS reports pt in and out of A-fib at this time. Pt is alert but confused.

## 2017-08-07 NOTE — ED Notes (Signed)
[facility]

## 2017-08-07 NOTE — ED Notes (Signed)
Patient opened her eyes and asked Dr. Roxan Hockeyobinson what happened. Patient is resting with eyes closed, still picking at the blankets.

## 2017-08-07 NOTE — ED Notes (Signed)
Pt given a cup of water. Pt aware that once she is finished with the water that we are going to get her up to ambulate. Pt stated "I don't think I can do that because my back hurts." Sonjia,RN made aware that pt is c/o back pain.

## 2017-08-08 DIAGNOSIS — R569 Unspecified convulsions: Secondary | ICD-10-CM | POA: Diagnosis not present

## 2017-08-08 LAB — CBC
HEMATOCRIT: 30.5 % — AB (ref 35.0–47.0)
Hemoglobin: 10.3 g/dL — ABNORMAL LOW (ref 12.0–16.0)
MCH: 28.7 pg (ref 26.0–34.0)
MCHC: 33.6 g/dL (ref 32.0–36.0)
MCV: 85.2 fL (ref 80.0–100.0)
Platelets: 192 10*3/uL (ref 150–440)
RBC: 3.58 MIL/uL — ABNORMAL LOW (ref 3.80–5.20)
RDW: 15.2 % — AB (ref 11.5–14.5)
WBC: 8.7 10*3/uL (ref 3.6–11.0)

## 2017-08-08 LAB — BASIC METABOLIC PANEL
Anion gap: 7 (ref 5–15)
BUN: 19 mg/dL (ref 6–20)
CHLORIDE: 108 mmol/L (ref 101–111)
CO2: 25 mmol/L (ref 22–32)
CREATININE: 0.95 mg/dL (ref 0.44–1.00)
Calcium: 8.6 mg/dL — ABNORMAL LOW (ref 8.9–10.3)
GFR calc Af Amer: >60 mL/min (ref >60)
GFR calc non Af Amer: 52 mL/min — ABNORMAL LOW (ref >60)
GLUCOSE: 88 mg/dL (ref 65–99)
POTASSIUM: 3.5 mmol/L (ref 3.5–5.1)
Sodium: 140 mmol/L (ref 135–145)

## 2017-08-08 MED ORDER — LEVETIRACETAM 500 MG PO TABS: 500.0000 mg | ORAL_TABLET | Freq: Two times a day (BID) | ORAL | 2 refills | Status: DC

## 2017-08-08 NOTE — Progress Notes (Signed)
   08/08/17 0945  Clinical Encounter Type  Visited With Patient not available  Visit Type Initial  Referral From Nurse  Consult/Referral To Chaplain   CH attempted to visit with patient per order req stating that patient had lost her husband two weeks ago, entered assisted living 2 weeks ago, and daughter was out of town.  Patient was asleep.  CH will return later for another attempt.

## 2017-08-08 NOTE — Progress Notes (Signed)
Daughter, "Britta MccreedyBarbara" notified this nurse with concern of the patient being unable to fill her anti-seizure medication upon [facility], daughter asked this nurse to call facility to see if her mother's medication could be filled, daughter stated she lives four hours away. While discussing the concern with the facility, the caregiver stated they did not accept patients on the weekend and could not fill the patient's prescription on this day. Notified Dr. Mack HookKallisetti, Dr. Mack HookKallisetti stated to call Karen/ social worker. Clydie BraunKaren stated the patient has been accepted [facility]. Notified the daughter and reiterated what Clydie BraunKaren stated concerning the prescriptions and the patient okay to return.  Daughter returned call after calling family stating a relative would get the prescription for the patient. Patient discharged with EMS, patient and daughter verbalized understanding of education. Patient with no complaints.

## 2017-08-08 NOTE — Discharge Summary (Signed)
[employer] [facility] [place] [facility]   PATIENT NAME: Megan Donaldson    MR#:  [id number]  DATE OF BIRTH:  1927/10/05  DATE OF ADMISSION:  08/07/2017   ADMITTING PHYSICIAN: Milagros Loll, MD  DATE OF DISCHARGE: 08/08/17  PRIMARY CARE PHYSICIAN: Dorothey Baseman, MD   ADMISSION DIAGNOSIS:   Syncope and collapse [R55] Seizure (HCC) [R56.9]  DISCHARGE DIAGNOSIS:   Active Problems:   Seizure (HCC)   SECONDARY DIAGNOSIS:   Past Medical History:  Diagnosis Date  . CHF (congestive heart failure) (HCC)   . Dementia   . GERD (gastroesophageal reflux disease)   . Hyperlipidemia   . Hypertension   . Osteoporosis   . Personal history of other venous thrombosis and embolism   . Personal history of PE (pulmonary embolism)     HOSPITAL COURSE:   84 year old female with past medical history significant for dementia, GERD, hypertension, osteoporosis, remote history of DVT and PE, recurrent syncopal episodes presents to hospital after a fall.  1.  Witnessed seizure-prior work-up for frequent syncopal episodes.  However had a witnessed seizure in the emergency room with postictal confusion. -Appreciate neurology consult -MRI of the brain with and without contrast negative for any acute findings.  Consistent with her dementia and chronic microvascular changes. -Started on Keppra in the hospital.  Neurology recommended discharge on Keppra and outpatient follow-up. -Physical therapy recommended home health  2.  Acute encephalopathy-secondary to postictal state.  Improved and she is back to her baseline.  Has underlying dementia but pleasant and cooperative.  3.  Hypertension-continue outpatient medications.  Patient on losartan, hydrochlorothiazide, metoprolol  4.  Hyperthyroidism-continue Synthroid  5.  GERD-Protonix  Patient is ambulating, will be discharged back to assisted living facility with home health  DISCHARGE CONDITIONS:   Guarded  CONSULTS OBTAINED:     Treatment Team:  Pauletta Browns, MD  DRUG ALLERGIES:   Allergies  Allergen Reactions  . Amoxicillin     Has patient had a PCN reaction causing immediate rash, facial/tongue/throat swelling, SOB or lightheadedness with hypotension: Unknown Has patient had a PCN reaction causing severe rash involving mucus membranes or skin necrosis: Unknown Has patient had a PCN reaction that required hospitalization: Unknown Has patient had a PCN reaction occurring within the last 10 years: Unknown If all of the above answers are "NO", then may proceed with Cephalosporin use.   . Latex   . Lisinopril    DISCHARGE MEDICATIONS:   Allergies as of 08/08/2017      Reactions   Amoxicillin    Has patient had a PCN reaction causing immediate rash, facial/tongue/throat swelling, SOB or lightheadedness with hypotension: Unknown Has patient had a PCN reaction causing severe rash involving mucus membranes or skin necrosis: Unknown Has patient had a PCN reaction that required hospitalization: Unknown Has patient had a PCN reaction occurring within the last 10 years: Unknown If all of the above answers are "NO", then may proceed with Cephalosporin use.   Latex    Lisinopril       Medication List    STOP taking these medications   HYDROcodone-acetaminophen 5-325 MG tablet Commonly known as:  NORCO     TAKE these medications   acetaminophen 500 MG tablet Commonly known as:  TYLENOL Take 1,000 mg by mouth daily as needed for mild pain.   diphenhydramine-acetaminophen 25-500 MG Tabs tablet Commonly known as:  TYLENOL PM Take 1 tablet by mouth at bedtime.   escitalopram 10 MG tablet Commonly known as:  LEXAPRO Take 10 mg by mouth daily.   levETIRAcetam 500 MG tablet Commonly known as:  KEPPRA Take 1 tablet (500 mg total) by mouth 2 (two) times daily.   levothyroxine 75 MCG tablet Commonly known as:  SYNTHROID, LEVOTHROID Take 75 mcg by mouth daily.   losartan-hydrochlorothiazide 50-12.5  MG tablet Commonly known as:  HYZAAR Take 0.5 tablets by mouth daily.   metoprolol tartrate 25 MG tablet Commonly known as:  LOPRESSOR Take 25 mg by mouth 2 (two) times daily.   nystatin cream Commonly known as:  MYCOSTATIN Apply 1 application topically 2 (two) times daily.   pantoprazole 40 MG tablet Commonly known as:  PROTONIX Take 40 mg by mouth 2 (two) times daily.   risedronate 35 MG tablet Commonly known as:  ACTONEL Take 35 mg by mouth every 7 (seven) days. Take with full glass of water. Do not lie down for the next 30 minutes.   simvastatin 40 MG tablet Commonly known as:  ZOCOR Take 40 mg by mouth at bedtime.        DISCHARGE INSTRUCTIONS:   1.  PCP follow-up in 1 to 2 weeks 2.  Neurology follow-up in 2 weeks  DIET:   Cardiac diet  ACTIVITY:   Activity as tolerated  OXYGEN:   Home Oxygen: No.  Oxygen Delivery: room air  DISCHARGE LOCATION:   Assisted living   If you experience worsening of your admission symptoms, develop shortness of breath, life threatening emergency, suicidal or homicidal thoughts you must seek medical attention immediately by calling 911 or calling your MD immediately  if symptoms less severe.  You Must read complete instructions/literature along with all the possible adverse reactions/side effects for all the Medicines you take and that have been prescribed to you. Take any new Medicines after you have completely understood and accpet all the possible adverse reactions/side effects.   Please note  You were cared for by a hospitalist during your hospital stay. If you have any questions about your discharge medications or the care you received while you were in the hospital after you are discharged, you can call the unit and asked to speak with the hospitalist on call if the hospitalist that took care of you is not available. Once you are discharged, your primary care physician will handle any further medical issues. Please note  that NO REFILLS for any discharge medications will be authorized once you are discharged, as it is imperative that you return to your primary care physician (or establish a relationship with a primary care physician if you do not have one) for your aftercare needs so that they can reassess your need for medications and monitor your lab values.    On the day of Discharge:  VITAL SIGNS:   Blood pressure (!) 155/63, pulse 83, temperature 97.8 F (36.6 C), temperature source Oral, resp. rate 16, height 5\' 6"  (1.676 m), weight 88.9 kg (195 lb 15.8 oz), SpO2 95 %.  PHYSICAL EXAMINATION:    GENERAL:  84 y.o.-year-old elderly patient lying in the bed with no acute distress.  EYES: Pupils equal, round, reactive to light and accommodation. No scleral icterus. Extraocular muscles intact.  HEENT: Head atraumatic, normocephalic. Oropharynx and nasopharynx clear.  NECK:  Supple, no jugular venous distention. No thyroid enlargement, no tenderness.  LUNGS: Normal breath sounds bilaterally, no wheezing, rales,rhonchi or crepitation. No use of accessory muscles of respiration.  Diminished bibasilar breath sounds CARDIOVASCULAR: S1, S2 normal. No rubs, or gallops. 2/6 systolic murmur is present  ABDOMEN: Soft, non-tender, non-distended. Bowel sounds present. No organomegaly or mass.  EXTREMITIES: No pedal edema, cyanosis, or clubbing.  NEUROLOGIC: Cranial nerves II through XII are intact. Muscle strength 5/5 in all extremities. Sensation intact. Gait not checked.  PSYCHIATRIC: The patient is alert and oriented x 2-3  SKIN: No obvious rash, lesion, or ulcer.   DATA REVIEW:   CBC Recent Labs  Lab 08/08/17 0411  WBC 8.7  HGB 10.3*  HCT 30.5*  PLT 192    Chemistries  Recent Labs  Lab 08/08/17 0411  NA 140  K 3.5  CL 108  CO2 25  GLUCOSE 88  BUN 19  CREATININE 0.95  CALCIUM 8.6*     Microbiology Results  No results found for this or any previous visit.  RADIOLOGY:  Dg Lumbar Spine 2-3  Views  Result Date: 08/07/2017 CLINICAL DATA:  Lower back pain after fall. EXAM: LUMBAR SPINE - 2-3 VIEW COMPARISON:  None available currently. FINDINGS: Moderate levoscoliosis of lower thoracic and upper lumbar spine is noted. No fracture or spondylolisthesis of lumbar spine is noted. Severe degenerative disc disease is noted at L1-2, L2-3 and L3-4. Moderate degenerative disc disease is noted at L4-5 and L5-S1. Atherosclerosis of abdominal aorta is noted. Mild lateral subluxation of L3 on L4 to the left is noted. IMPRESSION: Severe multilevel degenerative disc disease. No acute abnormality seen in the lumbar spine. Aortic Atherosclerosis (ICD10-I70.0). Electronically Signed   By: Lupita Raider, M.D.   On: 08/07/2017 15:44   Mr Laqueta Jean ZO Contrast  Result Date: 08/07/2017 CLINICAL DATA:  84 y/o F; found on floor, witnessed seizure. History of dementia EXAM: MRI HEAD WITHOUT AND WITH CONTRAST TECHNIQUE: Multiplanar, multiecho pulse sequences of the brain and surrounding structures were obtained without and with intravenous contrast. CONTRAST:  17mL MULTIHANCE GADOBENATE DIMEGLUMINE 529 MG/ML IV SOLN COMPARISON:  08/07/2017 CT head. FINDINGS: Brain: Motion degradation on multiple sequences. No reduced diffusion to suggest acute or early subacute infarction. No focal mass effect. Patchy T2 FLAIR hyperintense signal abnormality within subcortical periventricular white matter is compatible with advanced chronic microvascular ischemic changes. Moderate brain parenchymal volume loss. No abnormal susceptibility hypointensity to indicate intracranial hemorrhage. After administration of intravenous contrast there is no abnormal enhancement of the brain. Vascular: Normal flow voids. Skull and upper cervical spine: Normal marrow signal. Sinuses/Orbits: Negative. Other: None. IMPRESSION: 1. Severe motion degradation of multiple sequences. 2. No stroke, hemorrhage, or focal mass effect. No abnormal enhancement. 3.  Advanced chronic microvascular ischemic changes and moderate parenchymal volume loss of the brain. Electronically Signed   By: Mitzi Hansen M.D.   On: 08/07/2017 18:05     Management plans discussed with the patient, family and they are in agreement.  CODE STATUS:     Code Status Orders  (From admission, onward)        Start     Ordered   08/07/17 1553  Do not attempt resuscitation (DNR)  Continuous    Question Answer Comment  In the event of cardiac or respiratory ARREST Do not call a "code blue"   In the event of cardiac or respiratory ARREST Do not perform Intubation, CPR, defibrillation or ACLS   In the event of cardiac or respiratory ARREST Use medication by any route, position, wound care, and other measures to relive pain and suffering. May use oxygen, suction and manual treatment of airway obstruction as needed for comfort.      08/07/17 1553    Code Status History  Date Active Date Inactive Code Status Order ID Comments User Context   08/07/2017 1528 08/07/2017 1553 DNR [id number]  Willy Eddyobinson, Patrick, MD ED      TOTAL TIME TAKING CARE OF THIS PATIENT: 38 minutes.    Enid BaasKALISETTI,Nikash Mortensen M.D on 08/08/2017 at 1:01 PM  Between 7am to 6pm - Pager - [phone]  After 6pm go to [email or web address] - Social research officer, [email or web address] [email or web address]  [facility]  Office  [phone]  CC: Primary care physician; Dorothey BasemanBronstein, David, MD   Note: This dictation was prepared with Dragon dictation along with smaller phrase technology. Any transcriptional errors that result from this process are unintentional.

## 2017-08-08 NOTE — Progress Notes (Signed)
Patient is alert this morning. MD paged to obtain diet order, as there is no indication for NPO order other than lethargy. Megan Donaldson,Megan Donaldson S, RN

## 2017-08-08 NOTE — Care Management CC44 (Signed)
Condition Code 44 Documentation Completed  Patient Details  Name: Megan PillowRuth M Forgette MRN: [id number] Date of Birth: 06/20/1927   Condition Code 44 given:  Yes Patient signature on Condition Code 44 notice:  Yes Documentation of 2 MD's agreement:  Yes Code 44 added to claim:  Yes    Virgel ManifoldJosh A Nira Visscher, RN 08/08/2017, 1:48 PM

## 2017-08-08 NOTE — Consult Note (Signed)
Reason for Consult:seizure Referring Physician: Dr. Nemiah Commander   CC: seizure  HPI: Megan Donaldson is an 84 y.o. female with a known history of dementia, recurrent syncopal episodes, hypertension presented to the emergency room after she was found on the floor. Patient had a CT scan of the head in the emergency room which was normal. Plan was to discharge patient back as she had workup for syncope in the past. After this she had a witness seizure where her eyes were rolled to the left and she had 20 clonic movement of the left arm. Postictal state. Currently back to baseline. MRI brain no acute abnormalities.    Past Medical History:  Diagnosis Date  . CHF (congestive heart failure) (HCC)   . Dementia   . GERD (gastroesophageal reflux disease)   . Hyperlipidemia   . Hypertension   . Osteoporosis   . Personal history of other venous thrombosis and embolism   . Personal history of PE (pulmonary embolism)     Past Surgical History:  Procedure Laterality Date  . HIP CLOSED REDUCTION Right 12/21/2014   Procedure: CLOSED REDUCTION HIP;  Surgeon: Deeann Saint, MD;  Location: [facility] ORS;  Service: Orthopedics;  Laterality: Right;  . TOTAL HIP ARTHROPLASTY Right    x3    Family History  Problem Relation Age of Onset  . CAD Father     Social History:  reports that she has never smoked. She does not have any smokeless tobacco history on file. She reports that she does not drink alcohol or use drugs.  Allergies  Allergen Reactions  . Amoxicillin     Has patient had a PCN reaction causing immediate rash, facial/tongue/throat swelling, SOB or lightheadedness with hypotension: Unknown Has patient had a PCN reaction causing severe rash involving mucus membranes or skin necrosis: Unknown Has patient had a PCN reaction that required hospitalization: Unknown Has patient had a PCN reaction occurring within the last 10 years: Unknown If all of the above answers are "NO", then may proceed with  Cephalosporin use.   . Latex   . Lisinopril     Medications: I have reviewed the patient's current medications.  ROS: Confusion, unable to obtain from patient   Physical Examination: Blood pressure (!) 155/63, pulse 83, temperature 97.8 F (36.6 C), temperature source Oral, resp. rate 16, height 5\' 6"  (1.676 m), weight 195 lb 15.8 oz (88.9 kg), SpO2 95 %.   Neurological Examination   Mental Status: Alert to name.  Cranial Nerves: II: Discs flat bilaterally; Visual fields grossly normal, pupils equal, round, reactive to light and accommodation III,IV, VI: ptosis not present, extra-ocular motions intact bilaterally V,VII: smile symmetric, facial light touch sensation normal bilaterally VIII: hearing normal bilaterally IX,X: gag reflex present XI: bilateral shoulder shrug XII: midline tongue extension Motor: Generalized weakness 4/5  Tone and bulk:normal tone throughout; no atrophy noted Sensory: Pinprick and light touch intact throughout, bilaterally Deep Tendon Reflexes: 1+ and symmetric throughout Plantars: Right: downgoing   Left: downgoing Cerebellar: normal finger-to-nose Gait: not tested      Laboratory Studies:   Basic Metabolic Panel: Recent Labs  Lab 08/07/17 1013 08/08/17 0411  NA 138 140  K 3.2* 3.5  CL 103 108  CO2 24 25  GLUCOSE 164* 88  BUN 19 19  CREATININE 1.01* 0.95  CALCIUM 9.1 8.6*    Liver Function Tests: No results for input(s): AST, ALT, ALKPHOS, BILITOT, PROT, ALBUMIN in the last 168 hours. No results for input(s): LIPASE, AMYLASE in the last  168 hours. No results for input(s): AMMONIA in the last 168 hours.  CBC: Recent Labs  Lab 08/07/17 1013 08/08/17 0411  WBC 11.9* 8.7  HGB 12.0 10.3*  HCT 36.0 30.5*  MCV 85.4 85.2  PLT 224 192    Cardiac Enzymes: Recent Labs  Lab 08/07/17 1013 08/07/17 1329  TROPONINI <0.03 <0.03    BNP: Invalid input(s): POCBNP  CBG: No results for input(s): GLUCAP in the last 168  hours.  Microbiology: No results found for this or any previous visit.  Coagulation Studies: No results for input(s): LABPROT, INR in the last 72 hours.  Urinalysis:  Recent Labs  Lab 08/07/17 1239  COLORURINE YELLOW*  LABSPEC 1.012  PHURINE 6.0  GLUCOSEU NEGATIVE  HGBUR MODERATE*  BILIRUBINUR NEGATIVE  KETONESUR 5*  PROTEINUR NEGATIVE  NITRITE NEGATIVE  LEUKOCYTESUR NEGATIVE    Lipid Panel:  No results found for: CHOL, TRIG, HDL, CHOLHDL, VLDL, LDLCALC  HgbA1C: No results found for: HGBA1C  Urine Drug Screen:  No results found for: LABOPIA, COCAINSCRNUR, LABBENZ, AMPHETMU, THCU, LABBARB  Alcohol Level: No results for input(s): ETH in the last 168 hours.  Other results: EKG: normal EKG, normal sinus rhythm, unchanged from previous tracings.  Imaging: Dg Lumbar Spine 2-3 Views  Result Date: 08/07/2017 CLINICAL DATA:  Lower back pain after fall. EXAM: LUMBAR SPINE - 2-3 VIEW COMPARISON:  None available currently. FINDINGS: Moderate levoscoliosis of lower thoracic and upper lumbar spine is noted. No fracture or spondylolisthesis of lumbar spine is noted. Severe degenerative disc disease is noted at L1-2, L2-3 and L3-4. Moderate degenerative disc disease is noted at L4-5 and L5-S1. Atherosclerosis of abdominal aorta is noted. Mild lateral subluxation of L3 on L4 to the left is noted. IMPRESSION: Severe multilevel degenerative disc disease. No acute abnormality seen in the lumbar spine. Aortic Atherosclerosis (ICD10-I70.0). Electronically Signed   By: Lupita Raider, M.D.   On: 08/07/2017 15:44   Ct Head Wo Contrast  Result Date: 08/07/2017 CLINICAL DATA:  Dizziness and fall. EXAM: CT HEAD WITHOUT CONTRAST TECHNIQUE: Contiguous axial images were obtained from the base of the skull through the vertex without intravenous contrast. COMPARISON:  CT head dated February 11, 2010. FINDINGS: Brain: No evidence of acute infarction, hemorrhage, hydrocephalus, extra-axial collection or  mass lesion/mass effect. Relatively stable mild atrophy and chronic microvascular ischemic changes. Vascular: Calcified atherosclerosis at the skullbase. No hyperdense vessel. Skull: Normal. Negative for fracture or focal lesion. Sinuses/Orbits: No acute finding. Other: None. IMPRESSION: 1. No acute intracranial abnormality. Stable atrophy and chronic microvascular ischemic changes. Electronically Signed   By: Obie Dredge M.D.   On: 08/07/2017 12:14   Mr Laqueta Jean WU Contrast  Result Date: 08/07/2017 CLINICAL DATA:  84 y/o F; found on floor, witnessed seizure. History of dementia EXAM: MRI HEAD WITHOUT AND WITH CONTRAST TECHNIQUE: Multiplanar, multiecho pulse sequences of the brain and surrounding structures were obtained without and with intravenous contrast. CONTRAST:  17mL MULTIHANCE GADOBENATE DIMEGLUMINE 529 MG/ML IV SOLN COMPARISON:  08/07/2017 CT head. FINDINGS: Brain: Motion degradation on multiple sequences. No reduced diffusion to suggest acute or early subacute infarction. No focal mass effect. Patchy T2 FLAIR hyperintense signal abnormality within subcortical periventricular white matter is compatible with advanced chronic microvascular ischemic changes. Moderate brain parenchymal volume loss. No abnormal susceptibility hypointensity to indicate intracranial hemorrhage. After administration of intravenous contrast there is no abnormal enhancement of the brain. Vascular: Normal flow voids. Skull and upper cervical spine: Normal marrow signal. Sinuses/Orbits: Negative. Other: None. IMPRESSION: 1.  Severe motion degradation of multiple sequences. 2. No stroke, hemorrhage, or focal mass effect. No abnormal enhancement. 3. Advanced chronic microvascular ischemic changes and moderate parenchymal volume loss of the brain. Electronically Signed   By: Mitzi HansenLance  Furusawa-Stratton M.D.   On: 08/07/2017 18:05   Dg Chest Portable 1 View  Result Date: 08/07/2017 CLINICAL DATA:  Dizziness and fall.  Mid back  pain. EXAM: PORTABLE CHEST 1 VIEW COMPARISON:  10/25/2011 FINDINGS: Lungs are clear. Cardiomediastinal silhouette and remainder of the exam is unchanged. IMPRESSION: No acute findings. Electronically Signed   By: Elberta Fortisaniel  Boyle M.D.   On: 08/07/2017 10:56     Assessment/Plan: 84 y.o. female with a known history of dementia, recurrent syncopal episodes, hypertension presented to the emergency room after she was found on the floor. Patient had a CT scan of the head in the emergency room which was normal. Plan was to discharge patient back as she had workup for syncope in the past. After this she had a witness seizure where her eyes were rolled to the left and she had 20 clonic movement of the left arm. Postictal state. Currently back to baseline. MRI brain no acute abnormalities.  - As per daughter had multiple similar episodes and blank stare without responsiveness for a short period of time - Pt lives in NH facility and usually able to dress herself and feed herself - Close to baseline now.  - MRI no acute abnormalities outside of the WM changes - Pt was given 1g Keppra and started 500 BID  - Agree with con't of keppra - out pt neurology follow up - d/c planning  08/08/2017, 1:26 PM

## 2017-08-08 NOTE — Evaluation (Addendum)
Physical Therapy Evaluation Patient Details Name: Megan Donaldson MRN: [id number] DOB: July 21, 1927 Today's Date: 08/08/2017   History of Present Illness  Megan Donaldson is an 84yo white female who comes to [facility] on 6/22 after being found on floor at ALF. Pt noted ot have a seizure in the ED after arrival. PMH: dementia, recurrent syncope, HTN, Bilat THA, recurrent hip dislocation s/p repair.   Clinical Impression  Pt admitted with above diagnosis. Pt currently with functional limitations due to the deficits listed below (see "PT Problem List"). Upon entry, pt in bed, no family/caregiver present. RN is giving meds. The pt is awake and agreeable to participate. The pt is alert and oriented x4, pleasant, conversational, and following simple commands consistently. She reports her mentation to be not at baseline and altered, and this is apparent at times in session with some delayed repose times, some difficulty with problem solving (unable to find toilet paper when wrapping is still in place in fixture), and poor observance of IV line during AMB despite multiple cues: pt alsop with some difficulty in timelines, word finding issues, and abnormal word replacement (explaining that "sleeps in a walker.") Functional mobility assessment demonstrates increased effort/time requirements, poor tolerance, and need for heavy physical assistance, whereas the patient performed these at a higher level of independence PTA. Transfers from low surface requires mod-maxA to come to standing, and AMB is limited to 44ft. Empirically, the patient demonstrates increased risk of recurrent falls AEB gait speed <0.73m/s and forward reach <5" demonstrated throughout session. Pt will benefit from skilled PT intervention to increase independence and safety with basic mobility in preparation for discharge to the venue listed below.       Follow Up Recommendations Supervision/Assistance - 24 hour;Home health PT    Equipment Recommendations   None recommended by PT    Recommendations for Other Services       Precautions / Restrictions Precautions Precautions: Other (comment) Precaution Comments: seizure precautions Restrictions Weight Bearing Restrictions: No      Mobility  Bed Mobility Overal bed mobility: Needs Assistance Bed Mobility: Supine to Sit     Supine to sit: Mod assist     General bed mobility comments: typically sleeps in recliner at baseline;   Transfers Overall transfer level: Needs assistance Equipment used: Rolling walker (2 wheeled) Transfers: Sit to/from Stand Sit to Stand: Min guard         General transfer comment: looking for furniture to pull up, unsafe practices, trying to pull self up with IV pole.   Ambulation/Gait Ambulation/Gait assistance: Supervision Gait Distance (Feet): 20 Feet Assistive device: Rolling walker (2 wheeled) Gait Pattern/deviations: WFL(Within Functional Limits) Gait velocity: <0.40m/s    General Gait Details: slow and choppy, heayv verbal cues for directional changes d/t distractability.   Stairs            Wheelchair Mobility    Modified Rankin (Stroke Patients Only)       Balance Overall balance assessment: History of Falls;Needs assistance   Sitting balance-Leahy Scale: Fair     Standing balance support: During functional activity Standing balance-Leahy Scale: Fair Standing balance comment: RW                              Pertinent Vitals/Pain Pain Assessment: No/denies pain(reports no pain,but later limits AMB d/t back pain, new since fall to floor. )    Home Living Family/patient expects to be discharged to:: Assisted living  Home Equipment: Walker - 4 wheels Additional Comments: uses  4ww for ad lib AMB in facility.     Prior Function Level of Independence: Independent with assistive device(s)   Gait / Transfers Assistance Needed: short AMB  distances in room with 4ww   ADL's / Homemaking  Assistance Needed: independent in ADL        Hand Dominance        Extremity/Trunk Assessment   Upper Extremity Assessment Upper Extremity Assessment: Overall WFL for tasks assessed;Generalized weakness    Lower Extremity Assessment Lower Extremity Assessment: Generalized weakness;Overall WFL for tasks assessed       Communication      Cognition Arousal/Alertness: Lethargic Behavior During Therapy: WFL for tasks assessed/performed Overall Cognitive Status: Impaired/Different from baseline Area of Impairment: (oriented x4, but self reported difficulty withmentation at present, some anomia in giving history. )                                      General Comments      Exercises     Assessment/Plan    PT Assessment Patient needs continued PT services  PT Problem List Decreased strength;Decreased cognition;Decreased range of motion;Decreased activity tolerance;Decreased knowledge of use of DME;Decreased balance;Decreased coordination;Decreased mobility       PT Treatment Interventions Gait training;Functional mobility training;Therapeutic activities;Therapeutic exercise;Patient/family education;Cognitive remediation    PT Goals (Current goals can be found in the Care Plan section)  Acute Rehab PT Goals Patient Stated Goal: regain strength and independence PT Goal Formulation: With patient Time For Goal Achievement: 08/22/17 Potential to Achieve Goals: Good    Frequency Min 2X/week   Barriers to discharge        Co-evaluation               AM-PAC PT "6 Clicks" Daily Activity  Outcome Measure Difficulty turning over in bed (including adjusting bedclothes, sheets and blankets)?: A Lot Difficulty moving from lying on back to sitting on the side of the bed? : Unable Difficulty sitting down on and standing up from a chair with arms (e.g., wheelchair, bedside commode, etc,.)?: A Lot Help needed moving to and from a bed to chair (including a  wheelchair)?: A Lot Help needed walking in hospital room?: A Lot Help needed climbing 3-5 steps with a railing? : Total 6 Click Score: 10    End of Session Equipment Utilized During Treatment: Gait belt;Oxygen Activity Tolerance: Patient tolerated treatment well;Patient limited by pain;Other (comment)(mentation still altered) Patient left: in chair;with chair alarm set;with call bell/phone within reach(breakfast traypresented) Nurse Communication: Mobility status PT Visit Diagnosis: Unsteadiness on feet (R26.81);Muscle weakness (generalized) (M62.81);Difficulty in walking, not elsewhere classified (R26.2)    Time: [id number] PT Time Calculation (min) (ACUTE ONLY): 29 min   Charges:   PT Evaluation $PT Eval High Complexity: 1 High PT Treatments $Therapeutic Activity: 8-22 mins   PT G Codes:        11:45 AM, 08/08/17 Rosamaria LintsAllan C Heidy Mccubbin, PT, DPT Physical Therapist - [facility]  [phone] (ASCOM)    Zamarah Ullmer C 08/08/2017, 11:39 AM

## 2017-08-08 NOTE — Clinical Social Work Note (Signed)
Clinical Social Work Assessment  Patient Details  Name: Megan Donaldson MRN: [id number] Date of Birth: [birth year]  Date of referral:  08/08/17               Reason for consult:  Facility Placement                Permission sought to share information with:  Chartered certified accountant granted to share information::  Yes, Verbal Permission Granted  Name::        Agency::  [facility]  Relationship::     Contact Information:     Housing/Transportation Living arrangements for the past 2 months:  [place] of Information:  Patient, Medical Team, Facility Patient Interpreter Needed:  None Criminal Activity/Legal Involvement Pertinent to Current Situation/Hospitalization:  No - Comment as needed Significant Relationships:  Adult Children, Warehouse manager Lives with:  Facility Resident Do you feel safe going back to the place where you live?  Yes Need for family participation in patient care:  No (Coment)  Care giving concerns:  Patient admitted from an ALF  Social Worker assessment / plan:  The CSW met with the patient at bedside to discuss discharge planning. The patient confirmed that she has been a resident of [facility] for month. The patient discussed her grief since her husband passed away within the past 2 weeks, and the CSW provided emotional support. The patient is in agreement with discharge back to the facility today via non-emergent EMS as she has no family able to transport (they are out of town) and cannot discharge via taxi due to seizure history. The CSW spoke with [place] at [facility] who confirmed that the patient can return today. The CSW will deliver the discharge packet at which time the CSW will sign off. Please consult should additional needs arise.   Employment status:  Retired Nurse, adult PT Recommendations:  Home with [place] / Referral to community resources:      Patient/Family's Response to care:  The patient was engaged and thanked the CSW for assistance.  Patient/Family's Understanding of and Emotional Response to Diagnosis, Current Treatment, and Prognosis: The patient understands and agrees with the discharge plan.  Emotional Assessment Appearance:  Appears younger than stated age Attitude/Demeanor/Rapport:  Engaged, Self-Confident, Charismatic, Gracious Affect (typically observed):  Appropriate, Pleasant Orientation:  Oriented to Self, Oriented to Place, Oriented to  Time, Oriented to Situation Alcohol / Substance use:  Never Used Psych involvement (Current and /or in the community):  No (Comment)  Discharge Needs  Concerns to be addressed:  Care Coordination, Discharge Planning Concerns Readmission within the last 30 days:  No Current discharge risk:  None Barriers to Discharge:  No Barriers Identified   Zettie Pho, LCSW 08/08/2017, 1:15 PM

## 2017-08-08 NOTE — Progress Notes (Signed)
Per RN, patient couldnt go back to ALF today- verified with social worker who [facility] be discharged today.

## 2017-08-08 NOTE — Progress Notes (Signed)
   08/08/17 1325  Clinical Encounter Type  Visited With Patient  Visit Type Follow-up  Referral From Nurse  Consult/Referral To Chaplain  Spiritual Encounters  Spiritual Needs Emotional;Grief support   CH attempted follow-up.  Patient was sitting up in chair and eating lunch when I arrived.  CH asked how pt was doing and she mentioned losing her husband last week.  We talked about her husband and marriage, and that August would have been their 65th wedding [place]. Patient expressed gratitude for the care she received from her church Environmental manager([employer]) and chaplains during the loss of her husband.  She also expressed thanks for the care her husband received from nurses during his last days. CH asked if she would be released from the hospital soon and pt responded that she thought she would be discharged today but wasn't sure if she was ready.  She said that she had accomplished something during her visit: she was able to sleep. Nurse entered to care for patient and CH told patient I would continue to pray for her.  Patient again expressed gratitude for all the care she and her husband have received over the past couple weeks.

## 2017-08-08 NOTE — [place] FL2 (Signed)
[place] MEDICAID FL2 LEVEL OF CARE SCREENING TOOL     IDENTIFICATION  Patient Name: Megan Donaldson Birthdate: 11/21/1927 Sex: female Admission Date (Current Location): 08/07/2017  [place] and [place] Number:  Chiropodist and Address:  [facility], [address] [place], [place] [address]      Provider Number: [id number]  Attending Physician Name and Address:  Enid Baas, MD  Relative Name and Phone Number:  Barton Fanny (Daughter) [phone]    Current Level of Care: Hospital Recommended Level of Care: Assisted Living Facility Prior Approval Number:    Date Approved/Denied:   PASRR Number:    Discharge Plan: Domiciliary (Rest home)    Current Diagnoses: Patient Active Problem List   Diagnosis Date Noted  . Seizure (HCC) 08/07/2017    Orientation RESPIRATION BLADDER Height & Weight     Self, Time, Situation, Place  Normal Continent Weight: 195 lb 15.8 oz (88.9 kg) Height:  5\' 6"  (167.6 cm)  BEHAVIORAL SYMPTOMS/MOOD NEUROLOGICAL BOWEL NUTRITION STATUS    Convulsions/Seizures Continent Diet(2 gram sodium)  AMBULATORY STATUS COMMUNICATION OF NEEDS Skin   Supervision Verbally Bruising                       Personal Care Assistance Level of Assistance  Bathing, Feeding, Dressing Bathing Assistance: Independent Feeding assistance: Independent Dressing Assistance: Independent     Functional Limitations Info  Sight, Hearing, Speech Sight Info: Adequate Hearing Info: Adequate Speech Info: Adequate    SPECIAL CARE FACTORS FREQUENCY  PT (By licensed PT)     PT Frequency: Home Health PT 3X per week              Contractures Contractures Info: Not present    Additional Factors Info  Code Status, Allergies Code Status Info: DNR Allergies Info:  Amoxicillin, Latex, Lisinopril           Current Medications (08/08/2017):  This is the current hospital active medication list Current  Facility-Administered Medications  Medication Dose Route Frequency Provider Last Rate Last Dose  . 0.9 % NaCl with KCl 40 mEq / L  infusion   Intravenous Continuous Milagros Loll, MD 50 mL/hr at 08/07/17 2111 50 mL/hr at 08/07/17 2111  . acetaminophen (TYLENOL) tablet 650 mg  650 mg Oral Q6H PRN Milagros Loll, MD       Or  . acetaminophen (TYLENOL) suppository 650 mg  650 mg Rectal Q6H PRN Sudini, Wardell Heath, MD      . acetaminophen (TYLENOL) tablet 1,000 mg  1,000 mg Oral Once Willy Eddy, MD   Stopped at 08/07/17 1525  . albuterol (PROVENTIL) (2.5 MG/3ML) 0.083% nebulizer solution 2.5 mg  2.5 mg Nebulization Q2H PRN Sudini, Srikar, MD      . enoxaparin (LOVENOX) injection 40 mg  40 mg Subcutaneous Q24H Milagros Loll, MD   40 mg at 08/07/17 1839  . escitalopram (LEXAPRO) tablet 10 mg  10 mg Oral Daily Milagros Loll, MD   10 mg at 08/08/17 1054  . levETIRAcetam (KEPPRA) IVPB 500 mg/100 mL premix  500 mg Intravenous Q12H Milagros Loll, MD   Stopped at 08/07/17 2139  . levothyroxine (SYNTHROID, LEVOTHROID) tablet 75 mcg  75 mcg Oral Daily Milagros Loll, MD   75 mcg at 08/08/17 1053  . LORazepam (ATIVAN) injection 1 mg  1 mg Intravenous Once Willy Eddy, MD   Stopped at 08/07/17 1514  . losartan (COZAAR) tablet 50 mg  50 mg Oral Daily Milagros Loll, MD  50 mg at 08/08/17 1054  . metoprolol tartrate (LOPRESSOR) tablet 25 mg  25 mg Oral BID Milagros LollSudini, Srikar, MD   25 mg at 08/08/17 1054  . ondansetron (ZOFRAN) tablet 4 mg  4 mg Oral Q6H PRN Milagros LollSudini, Srikar, MD       Or  . ondansetron (ZOFRAN) injection 4 mg  4 mg Intravenous Q6H PRN Sudini, Srikar, MD      . pantoprazole (PROTONIX) EC tablet 40 mg  40 mg Oral BID Milagros LollSudini, Srikar, MD   40 mg at 08/08/17 1054  . polyethylene glycol (MIRALAX / GLYCOLAX) packet 17 g  17 g Oral Daily PRN Sudini, Wardell HeathSrikar, MD      . simvastatin (ZOCOR) tablet 40 mg  40 mg Oral QHS Sudini, Srikar, MD      . sodium chloride flush (NS) 0.9 % injection 3 mL  3 mL  Intravenous Q12H Sudini, Srikar, MD   3 mL at 08/08/17 1057  . sodium chloride flush (NS) 0.9 % injection 3 mL  3 mL Intravenous PRN Milagros LollSudini, Srikar, MD         Discharge Medications: Medication List    STOP taking these medications   HYDROcodone-acetaminophen 5-325 MG tablet Commonly known as:  NORCO     TAKE these medications   acetaminophen 500 MG tablet Commonly known as:  TYLENOL Take 1,000 mg by mouth daily as needed for mild pain.   diphenhydramine-acetaminophen 25-500 MG Tabs tablet Commonly known as:  TYLENOL PM Take 1 tablet by mouth at bedtime.   escitalopram 10 MG tablet Commonly known as:  LEXAPRO Take 10 mg by mouth daily.   levETIRAcetam 500 MG tablet Commonly known as:  KEPPRA Take 1 tablet (500 mg total) by mouth 2 (two) times daily.   levothyroxine 75 MCG tablet Commonly known as:  SYNTHROID, LEVOTHROID Take 75 mcg by mouth daily.   losartan-hydrochlorothiazide 50-12.5 MG tablet Commonly known as:  HYZAAR Take 0.5 tablets by mouth daily.   metoprolol tartrate 25 MG tablet Commonly known as:  LOPRESSOR Take 25 mg by mouth 2 (two) times daily.   nystatin cream Commonly known as:  MYCOSTATIN Apply 1 application topically 2 (two) times daily.   pantoprazole 40 MG tablet Commonly known as:  PROTONIX Take 40 mg by mouth 2 (two) times daily.   risedronate 35 MG tablet Commonly known as:  ACTONEL Take 35 mg by mouth every 7 (seven) days. Take with full glass of water. Do not lie down for the next 30 minutes.   simvastatin 40 MG tablet Commonly known as:  ZOCOR Take 40 mg by mouth at bedtime.      Relevant Imaging Results:  Relevant Lab Results:   Additional Information SS# [id number]  [name], LCSW

## 2017-09-17 ENCOUNTER — Other Ambulatory Visit: Payer: Self-pay

## 2017-09-17 ENCOUNTER — Encounter: Payer: Self-pay | Admitting: *Deleted

## 2017-09-17 ENCOUNTER — Emergency Department: Payer: Medicare Other

## 2017-09-17 ENCOUNTER — Inpatient Hospital Stay
Admission: EM | Admit: 2017-09-17 | Discharge: 2017-09-19 | DRG: 880 | Disposition: A | Discharge status: Home Health | Payer: Medicare Other | Source: Skilled Nursing Facility | Attending: Internal Medicine | Admitting: Internal Medicine

## 2017-09-17 DIAGNOSIS — Z888 Allergy status to other drugs, medicaments and biological substances status: Secondary | ICD-10-CM | POA: Diagnosis not present

## 2017-09-17 DIAGNOSIS — R001 Bradycardia, unspecified: Secondary | ICD-10-CM | POA: Diagnosis present

## 2017-09-17 DIAGNOSIS — Z96641 Presence of right artificial hip joint: Secondary | ICD-10-CM | POA: Diagnosis present

## 2017-09-17 DIAGNOSIS — Z79890 Hormone replacement therapy (postmenopausal): Secondary | ICD-10-CM | POA: Diagnosis not present

## 2017-09-17 DIAGNOSIS — Z91040 Latex allergy status: Secondary | ICD-10-CM | POA: Diagnosis not present

## 2017-09-17 DIAGNOSIS — E785 Hyperlipidemia, unspecified: Secondary | ICD-10-CM | POA: Diagnosis present

## 2017-09-17 DIAGNOSIS — I5033 Acute on chronic diastolic (congestive) heart failure: Secondary | ICD-10-CM | POA: Diagnosis not present

## 2017-09-17 DIAGNOSIS — G9340 Encephalopathy, unspecified: Secondary | ICD-10-CM | POA: Diagnosis present

## 2017-09-17 DIAGNOSIS — Z86718 Personal history of other venous thrombosis and embolism: Secondary | ICD-10-CM | POA: Diagnosis not present

## 2017-09-17 DIAGNOSIS — I472 Ventricular tachycardia: Secondary | ICD-10-CM | POA: Diagnosis present

## 2017-09-17 DIAGNOSIS — Z9181 History of falling: Secondary | ICD-10-CM

## 2017-09-17 DIAGNOSIS — I4581 Long QT syndrome: Secondary | ICD-10-CM | POA: Diagnosis present

## 2017-09-17 DIAGNOSIS — K219 Gastro-esophageal reflux disease without esophagitis: Secondary | ICD-10-CM | POA: Diagnosis present

## 2017-09-17 DIAGNOSIS — I509 Heart failure, unspecified: Secondary | ICD-10-CM | POA: Diagnosis present

## 2017-09-17 DIAGNOSIS — F329 Major depressive disorder, single episode, unspecified: Secondary | ICD-10-CM | POA: Diagnosis present

## 2017-09-17 DIAGNOSIS — R531 Weakness: Secondary | ICD-10-CM | POA: Diagnosis present

## 2017-09-17 DIAGNOSIS — Z79899 Other long term (current) drug therapy: Secondary | ICD-10-CM | POA: Diagnosis not present

## 2017-09-17 DIAGNOSIS — R4182 Altered mental status, unspecified: Secondary | ICD-10-CM | POA: Diagnosis present

## 2017-09-17 DIAGNOSIS — I110 Hypertensive heart disease with heart failure: Secondary | ICD-10-CM | POA: Diagnosis present

## 2017-09-17 DIAGNOSIS — Z66 Do not resuscitate: Secondary | ICD-10-CM | POA: Diagnosis present

## 2017-09-17 DIAGNOSIS — I10 Essential (primary) hypertension: Secondary | ICD-10-CM | POA: Diagnosis not present

## 2017-09-17 DIAGNOSIS — Z881 Allergy status to other antibiotic agents status: Secondary | ICD-10-CM | POA: Diagnosis not present

## 2017-09-17 DIAGNOSIS — Z86711 Personal history of pulmonary embolism: Secondary | ICD-10-CM

## 2017-09-17 DIAGNOSIS — M810 Age-related osteoporosis without current pathological fracture: Secondary | ICD-10-CM | POA: Diagnosis present

## 2017-09-17 DIAGNOSIS — Z8249 Family history of ischemic heart disease and other diseases of the circulatory system: Secondary | ICD-10-CM

## 2017-09-17 DIAGNOSIS — F05 Delirium due to known physiological condition: Principal | ICD-10-CM | POA: Diagnosis present

## 2017-09-17 DIAGNOSIS — I5030 Unspecified diastolic (congestive) heart failure: Secondary | ICD-10-CM | POA: Diagnosis not present

## 2017-09-17 DIAGNOSIS — F0390 Unspecified dementia without behavioral disturbance: Secondary | ICD-10-CM | POA: Diagnosis present

## 2017-09-17 DIAGNOSIS — G40909 Epilepsy, unspecified, not intractable, without status epilepticus: Secondary | ICD-10-CM | POA: Diagnosis present

## 2017-09-17 LAB — COMPREHENSIVE METABOLIC PANEL
ALK PHOS: 60 U/L (ref 38–126)
ALT: 13 U/L (ref 0–44)
ANION GAP: 8 (ref 5–15)
AST: 22 U/L (ref 15–41)
Albumin: 3.8 g/dL (ref 3.5–5.0)
BILIRUBIN TOTAL: 0.4 mg/dL (ref 0.3–1.2)
BUN: 23 mg/dL (ref 8–23)
CALCIUM: 9.1 mg/dL (ref 8.9–10.3)
CO2: 26 mmol/L (ref 22–32)
Chloride: 107 mmol/L (ref 98–111)
Creatinine, Ser: 0.96 mg/dL (ref 0.44–1.00)
GFR calc non Af Amer: 52 mL/min — ABNORMAL LOW (ref >60)
GFR, EST AFRICAN AMERICAN: 60 mL/min — AB (ref >60)
Glucose, Bld: 103 mg/dL — ABNORMAL HIGH (ref 70–99)
POTASSIUM: 4 mmol/L (ref 3.5–5.1)
SODIUM: 141 mmol/L (ref 135–145)
Total Protein: 6.4 g/dL — ABNORMAL LOW (ref 6.5–8.1)

## 2017-09-17 LAB — TROPONIN I: Troponin I: <0.03 ng/mL (ref <0.03)

## 2017-09-17 LAB — URINALYSIS, COMPLETE (UACMP) WITH MICROSCOPIC
BACTERIA UA: NONE SEEN
Bilirubin Urine: NEGATIVE
Glucose, UA: NEGATIVE mg/dL
Hgb urine dipstick: NEGATIVE
Ketones, ur: NEGATIVE mg/dL
Leukocytes, UA: NEGATIVE
Nitrite: NEGATIVE
PROTEIN: NEGATIVE mg/dL
Specific Gravity, Urine: 1.021 (ref 1.005–1.030)
pH: 5 (ref 5.0–8.0)

## 2017-09-17 LAB — CBC WITH DIFFERENTIAL/PLATELET
Basophils Absolute: 0.1 10*3/uL (ref 0–0.1)
Basophils Relative: 1 %
EOS ABS: 0.2 10*3/uL (ref 0–0.7)
Eosinophils Relative: 3 %
HEMATOCRIT: 34.5 % — AB (ref 35.0–47.0)
HEMOGLOBIN: 11.7 g/dL — AB (ref 12.0–16.0)
Lymphocytes Relative: 43 %
Lymphs Abs: 2.9 10*3/uL (ref 1.0–3.6)
MCH: 28.4 pg (ref 26.0–34.0)
MCHC: 33.8 g/dL (ref 32.0–36.0)
MCV: 84.1 fL (ref 80.0–100.0)
MONO ABS: 0.8 10*3/uL (ref 0.2–0.9)
MONOS PCT: 12 %
NEUTROS PCT: 41 %
Neutro Abs: 2.7 10*3/uL (ref 1.4–6.5)
Platelets: 203 10*3/uL (ref 150–440)
RBC: 4.11 MIL/uL (ref 3.80–5.20)
RDW: 16.6 % — ABNORMAL HIGH (ref 11.5–14.5)
WBC: 6.6 10*3/uL (ref 3.6–11.0)

## 2017-09-17 LAB — MAGNESIUM: MAGNESIUM: 2.1 mg/dL (ref 1.7–2.4)

## 2017-09-17 MED ORDER — MAGNESIUM SULFATE 2 GM/50ML IV SOLN
2.0000 g | Freq: Once | INTRAVENOUS | Status: AC
  Administered 2017-09-17: 2 g via INTRAVENOUS
  Filled 2017-09-17: qty 50

## 2017-09-17 MED ORDER — DOCUSATE SODIUM 100 MG PO CAPS
100.0000 mg | ORAL_CAPSULE | Freq: Two times a day (BID) | ORAL | Status: DC
  Administered 2017-09-17 – 2017-09-19 (×4): 100 mg via ORAL
  Filled 2017-09-17 (×4): qty 1

## 2017-09-17 MED ORDER — ASPIRIN EC 81 MG PO TBEC
81.0000 mg | DELAYED_RELEASE_TABLET | Freq: Every day | ORAL | Status: DC
  Administered 2017-09-18 – 2017-09-19 (×2): 81 mg via ORAL
  Filled 2017-09-17 (×2): qty 1

## 2017-09-17 MED ORDER — RISEDRONATE SODIUM 5 MG PO TABS: 35.0000 mg | ORAL_TABLET | ORAL | Status: DC

## 2017-09-17 MED ORDER — ACETAMINOPHEN 650 MG RE SUPP: 650.0000 mg | Freq: Four times a day (QID) | RECTAL | Status: DC | PRN

## 2017-09-17 MED ORDER — HEPARIN SODIUM (PORCINE) 5000 UNIT/ML IJ SOLN
5000.0000 [IU] | Freq: Three times a day (TID) | INTRAMUSCULAR | Status: DC
  Administered 2017-09-17 – 2017-09-19 (×5): 5000 [IU] via SUBCUTANEOUS
  Filled 2017-09-17 (×5): qty 1

## 2017-09-17 MED ORDER — ESCITALOPRAM OXALATE 10 MG PO TABS
10.0000 mg | ORAL_TABLET | Freq: Every day | ORAL | Status: DC
  Administered 2017-09-18 – 2017-09-19 (×2): 10 mg via ORAL
  Filled 2017-09-17 (×2): qty 1

## 2017-09-17 MED ORDER — ONDANSETRON HCL 4 MG/2ML IJ SOLN: 4.0000 mg | Freq: Four times a day (QID) | INTRAMUSCULAR | Status: DC | PRN

## 2017-09-17 MED ORDER — TRAZODONE HCL 50 MG PO TABS
25.0000 mg | ORAL_TABLET | Freq: Every evening | ORAL | Status: DC | PRN
  Administered 2017-09-17: 25 mg via ORAL
  Filled 2017-09-17: qty 1

## 2017-09-17 MED ORDER — ONDANSETRON HCL 4 MG PO TABS: 4.0000 mg | ORAL_TABLET | Freq: Four times a day (QID) | ORAL | Status: DC | PRN

## 2017-09-17 MED ORDER — ACETAMINOPHEN 325 MG PO TABS
650.0000 mg | ORAL_TABLET | Freq: Four times a day (QID) | ORAL | Status: DC | PRN
  Administered 2017-09-18: 650 mg via ORAL
  Filled 2017-09-17: qty 2

## 2017-09-17 MED ORDER — LEVETIRACETAM 500 MG PO TABS
500.0000 mg | ORAL_TABLET | Freq: Two times a day (BID) | ORAL | Status: DC
  Administered 2017-09-17 – 2017-09-18 (×2): 500 mg via ORAL
  Filled 2017-09-17 (×2): qty 1

## 2017-09-17 MED ORDER — PEG 3350 17 GM/SCOOP PO POWD: 17.0000 g | Freq: Every day | ORAL | Status: DC | PRN

## 2017-09-17 MED ORDER — BISACODYL 5 MG PO TBEC: 5.0000 mg | DELAYED_RELEASE_TABLET | Freq: Every day | ORAL | Status: DC | PRN

## 2017-09-17 MED ORDER — LEVOTHYROXINE SODIUM 50 MCG PO TABS
75.0000 ug | ORAL_TABLET | Freq: Every day | ORAL | Status: DC
  Administered 2017-09-18 – 2017-09-19 (×2): 75 ug via ORAL
  Filled 2017-09-17 (×2): qty 1

## 2017-09-17 MED ORDER — HYDROCODONE-ACETAMINOPHEN 5-325 MG PO TABS: 1.0000 | ORAL_TABLET | ORAL | Status: DC | PRN

## 2017-09-17 MED ORDER — SODIUM CHLORIDE 0.9 % IV SOLN
Freq: Once | INTRAVENOUS | Status: AC
  Administered 2017-09-17: 23:00:00 via INTRAVENOUS

## 2017-09-17 MED ORDER — METOPROLOL TARTRATE 25 MG PO TABS
25.0000 mg | ORAL_TABLET | Freq: Two times a day (BID) | ORAL | Status: DC
  Administered 2017-09-18: 25 mg via ORAL
  Filled 2017-09-17: qty 1

## 2017-09-17 MED ORDER — POLYETHYLENE GLYCOL 3350 17 G PO PACK: 17.0000 g | PACK | Freq: Every day | ORAL | Status: DC | PRN

## 2017-09-17 NOTE — ED Notes (Signed)
Pt here for mental status change.  Pt alert.  Pt smells of urine on arrival.  No pain.  Pt follows commands.  Iv in place

## 2017-09-17 NOTE — H&P (Signed)
[facility] [place] [facility]   PATIENT NAME: Megan ShihRuth Donaldson    MR#:  [id number]  DATE OF BIRTH:  [birth year]  DATE OF ADMISSION:  09/17/2017  PRIMARY CARE PHYSICIAN: Dorothey BasemanBronstein, David, MD   REQUESTING/REFERRING PHYSICIAN:   CHIEF COMPLAINT:   Chief Complaint  Patient presents with  . Altered Mental Status    HISTORY OF PRESENT ILLNESS: Megan ShihRuth Puryear  is a 84 y.o. female, nursing home resident, with a known history of CHF, dementia, hypertension and other comorbidities. Patient is unable to provide history due to confusion.  Formation was taken from reviewing the medical chart and from discussion with emergency room physician. She was transferred from nursing home for lethargy and worsening confusion going on for the past 24 hours, gradually getting worse.  No reports of fever or chills, no vomiting, no cough, no chest pain or abdominal pain.  Per nursing home reports, Keppra dose was recently reduced from 500 mg twice a day to 250 mg twice a day. Blood test done emergency room are grossly within normal limits.  Troponin level is less than 0.03.  UA is negative for UTI. EKG shows normal sinus rhythm with PACs and heart rate at 64, no acute ischemic changes.  While in the emergency room, heart rate went even lower, and 40s. CT of the head is negative for acute intracranial abnormalities.  Chest x-ray is pending.  PAST MEDICAL HISTORY:   Past Medical History:  Diagnosis Date  . CHF (congestive heart failure) (HCC)   . Dementia   . GERD (gastroesophageal reflux disease)   . Hyperlipidemia   . Hypertension   . Osteoporosis   . Personal history of other venous thrombosis and embolism   . Personal history of PE (pulmonary embolism)     PAST SURGICAL HISTORY:  Past Surgical History:  Procedure Laterality Date  . HIP CLOSED REDUCTION Right 12/21/2014   Procedure: CLOSED REDUCTION HIP;  Surgeon: Deeann SaintHoward Miller, MD;  Location: [facility] ORS;  Service: Orthopedics;   Laterality: Right;  . TOTAL HIP ARTHROPLASTY Right    x3    SOCIAL HISTORY:  Social History   Tobacco Use  . Smoking status: Never Smoker  . Smokeless tobacco: Never Used  Substance Use Topics  . Alcohol use: No    FAMILY HISTORY:  Family History  Problem Relation Age of Onset  . CAD Father     DRUG ALLERGIES:  Allergies  Allergen Reactions  . Amoxicillin     Has patient had a PCN reaction causing immediate rash, facial/tongue/throat swelling, SOB or lightheadedness with hypotension: Unknown Has patient had a PCN reaction causing severe rash involving mucus membranes or skin necrosis: Unknown Has patient had a PCN reaction that required hospitalization: Unknown Has patient had a PCN reaction occurring within the last 10 years: Unknown If all of the above answers are "NO", then may proceed with Cephalosporin use.   . Latex   . Lisinopril     REVIEW OF SYSTEMS:   Unable to obtain, due to patient's confusion.  MEDICATIONS AT HOME:  Prior to Admission medications   Medication Sig Start Date End Date Taking? Authorizing Provider  acetaminophen (TYLENOL) 500 MG tablet Take 1,000 mg by mouth 2 (two) times daily.    Yes [provider]  aspirin EC 81 MG tablet Take 1 tablet by mouth daily. 08/27/17 08/27/18 Yes [provider]  escitalopram (LEXAPRO) 10 MG tablet Take 10 mg by mouth daily. 07/21/17  Yes [provider]  levETIRAcetam (KEPPRA) 500 MG tablet Take 1 tablet (500 mg total) by mouth 2 (two) times daily. Patient taking differently: Take 250 mg by mouth 2 (two) times daily.  08/08/17  Yes Enid Baas, MD  levothyroxine (SYNTHROID, LEVOTHROID) 75 MCG tablet Take 75 mcg by mouth daily.   Yes [provider]  metoprolol tartrate (LOPRESSOR) 25 MG tablet Take 25 mg by mouth 2 (two) times daily.   Yes [provider]  Polyethylene Glycol 3350 (PEG 3350) POWD Take 17 g by mouth daily as needed. 08/17/17  Yes [provider]  risedronate (ACTONEL) 35 MG tablet Take 35 mg by mouth every 7 (seven) days. Take with full glass of water. Do not lie down for the next 30 minutes. 07/15/17  Yes [provider]      PHYSICAL EXAMINATION:   VITAL SIGNS: Blood pressure (!) 152/65, pulse (!) 43, temperature 97.7 F (36.5 C), temperature source Oral, resp. rate 18, height 5\' 3"  (1.6 m), weight 83 kg (183 lb), SpO2 95 %.  GENERAL:  84 y.o.-year-old patient lying in the bed with no acute distress.  She is pleasantly confused. EYES: Pupils equal, round, reactive to light and accommodation. No scleral icterus.  HEENT: Head atraumatic, normocephalic. Oropharynx and nasopharynx clear.  NECK:  Supple, no jugular venous distention. No thyroid enlargement, no tenderness.  LUNGS: Normal breath sounds bilaterally, no wheezing, rales,rhonchi or crepitation. No use of accessory muscles of respiration.  CARDIOVASCULAR: S1, S2 normal. No S3/S4.  ABDOMEN: Soft, nontender, nondistended. Bowel sounds present. No organomegaly or mass.  EXTREMITIES: No pedal edema, cyanosis, or clubbing.  NEUROLOGIC: Normal speech and language.  No focal weakness appreciated. PSYCHIATRIC: The patient is alert, but confused.  SKIN: No obvious rash, lesion, or ulcer.   LABORATORY PANEL:   CBC Recent Labs  Lab 09/17/17 1832  WBC 6.6  HGB 11.7*  HCT 34.5*  PLT 203  MCV 84.1  MCH 28.4  MCHC 33.8  RDW 16.6*  LYMPHSABS 2.9  MONOABS 0.8  EOSABS 0.2  BASOSABS 0.1   ------------------------------------------------------------------------------------------------------------------  Chemistries  Recent Labs  Lab 09/17/17 1832  NA 141  K 4.0  CL 107  CO2 26  GLUCOSE 103*  BUN 23  CREATININE 0.96  CALCIUM 9.1  MG 2.1  AST 22  ALT 13  ALKPHOS 60  BILITOT 0.4   ------------------------------------------------------------------------------------------------------------------ estimated creatinine clearance is 42.1 mL/min  (by C-G formula based on SCr of 0.96 mg/dL). ------------------------------------------------------------------------------------------------------------------ No results for input(s): TSH, T4TOTAL, T3FREE, THYROIDAB in the last 72 hours.  Invalid input(s): FREET3   Coagulation profile No results for input(s): INR, PROTIME in the last 168 hours. ------------------------------------------------------------------------------------------------------------------- No results for input(s): DDIMER in the last 72 hours. -------------------------------------------------------------------------------------------------------------------  Cardiac Enzymes Recent Labs  Lab 09/17/17 1832  TROPONINI <0.03   ------------------------------------------------------------------------------------------------------------------ Invalid input(s): POCBNP  ---------------------------------------------------------------------------------------------------------------  Urinalysis    Component Value Date/Time   COLORURINE YELLOW (A) 09/17/2017 1832   APPEARANCEUR CLEAR (A) 09/17/2017 1832   APPEARANCEUR Hazy 10/25/2011 1219   LABSPEC 1.021 09/17/2017 1832   LABSPEC 1.023 10/25/2011 1219   PHURINE 5.0 09/17/2017 1832   GLUCOSEU NEGATIVE 09/17/2017 1832   GLUCOSEU Negative 10/25/2011 1219   HGBUR NEGATIVE 09/17/2017 1832   BILIRUBINUR NEGATIVE 09/17/2017 1832   BILIRUBINUR Negative 10/25/2011 1219   KETONESUR NEGATIVE 09/17/2017 1832   PROTEINUR NEGATIVE 09/17/2017 1832   NITRITE NEGATIVE 09/17/2017 1832   LEUKOCYTESUR NEGATIVE 09/17/2017 1832   LEUKOCYTESUR 1+ 10/25/2011 1219     RADIOLOGY: Ct Head Wo Contrast  Result Date: 09/17/2017 CLINICAL DATA:  Altered mental status. EXAM: CT HEAD WITHOUT CONTRAST TECHNIQUE: Contiguous axial images were obtained from the base of the skull through the vertex without intravenous contrast. COMPARISON:  Brain MRI 08/07/2017.  Head CT 02/11/2010 FINDINGS: Brain:  The the There is no evidence for acute hemorrhage, hydrocephalus, mass lesion, or abnormal extra-axial fluid collection. No definite CT evidence for acute infarction. Diffuse loss of parenchymal volume is consistent with atrophy. Patchy low attenuation in the deep hemispheric and periventricular white matter is nonspecific, but likely reflects chronic microvascular ischemic demyelination. Vascular: No hyperdense vessel or unexpected calcification. Skull: No evidence for fracture. No worrisome lytic or sclerotic lesion. Sinuses/Orbits: The visualized paranasal sinuses and mastoid air cells are clear. Visualized portions of the globes and intraorbital fat are unremarkable. Other: None. IMPRESSION: 1. No acute intracranial abnormality. 2. Atrophy with chronic small vessel white matter ischemic disease. Electronically Signed   By: Kennith Center M.D.   On: 09/17/2017 19:13    EKG: Orders placed or performed during the hospital encounter of 09/17/17  . ED EKG  . ED EKG  . EKG 12-Lead  . EKG 12-Lead  . EKG 12-Lead  . EKG 12-Lead    IMPRESSION AND PLAN:  1.  Acute encephalopathy, of unclear etiology.  No signs of infection, no electrolyte abnormalities, no acute kidney failure.  Will increase Keppra back to 500 mg twice a day, in case this is related to seizure episode.  We will have neurology further evaluate and treat the patient. 2.  Bradycardia, improved status post magnesium administered in the emergency room.  Will hold beta-blocker and continue to monitor patient on telemetry.  We might need to switch to a different blood pressure agent if needed for hypertension.  Will consult cardiology for further evaluation and treatment. 3.  Seizure disorder, on Keppra.  All the records are reviewed and case discussed with ED provider. Management plans discussed with the patient, family and they are in agreement.  CODE STATUS: DNR Code Status History    Date Active Date Inactive Code Status Order ID  Comments User Context   08/07/2017 1553 08/08/2017 1938 DNR [id number]  Milagros Loll, MD ED   08/07/2017 1528 08/07/2017 1553 DNR [id number]  Willy Eddy, MD ED    Questions for Most Recent Historical Code Status (Order [id number])    Question Answer Comment   In the event of cardiac or respiratory ARREST Do not call a "code blue"    In the event of cardiac or respiratory ARREST Do not perform Intubation, CPR, defibrillation or ACLS    In the event of cardiac or respiratory ARREST Use medication by any route, position, wound care, and other measures to relive pain and suffering. May use oxygen, suction and manual treatment of airway obstruction as needed for comfort.        TOTAL TIME TAKING CARE OF THIS PATIENT: 45 minutes.    Cammy Copa M.D on 09/17/2017 at 10:02 PM  Between 7am to 6pm - Pager - [phone]  After 6pm go to [email or web address] - password [email or web address] [facility]  [facility]  Office  [phone]  CC: Primary care physician; Dorothey Baseman, MD

## 2017-09-17 NOTE — ED Notes (Signed)
Amy RN, aware of bed assigned  

## 2017-09-17 NOTE — ED Notes (Signed)
Family with pt

## 2017-09-17 NOTE — ED Provider Notes (Addendum)
[facility] Emergency Department Provider Note ____________________________________________   First MD Initiated Contact with Patient 09/17/17 1831     (approximate)  I have reviewed the triage vital signs and the nursing notes.   HISTORY  Chief Complaint Altered Mental Status   HPI Megan Donaldson is a 84 y.o. female with a history of dementia as well as CHF who is presenting to the emergency department for altered mental status.  Unknown last normal.  Patient without any complaints at this time.  Unable to give detailed account of recent history.  However, not complaining of any pain.  Past Medical History:  Diagnosis Date  . CHF (congestive heart failure) (HCC)   . Dementia   . GERD (gastroesophageal reflux disease)   . Hyperlipidemia   . Hypertension   . Osteoporosis   . Personal history of other venous thrombosis and embolism   . Personal history of PE (pulmonary embolism)     Patient Active Problem List   Diagnosis Date Noted  . Seizure (HCC) 08/07/2017    Past Surgical History:  Procedure Laterality Date  . HIP CLOSED REDUCTION Right 12/21/2014   Procedure: CLOSED REDUCTION HIP;  Surgeon: Deeann Saint, MD;  Location: [facility] ORS;  Service: Orthopedics;  Laterality: Right;  . TOTAL HIP ARTHROPLASTY Right    x3    Prior to Admission medications   Medication Sig Start Date End Date Taking? Authorizing Provider  acetaminophen (TYLENOL) 500 MG tablet Take 1,000 mg by mouth daily as needed for mild pain.     [provider]  diphenhydramine-acetaminophen (TYLENOL PM) 25-500 MG TABS tablet Take 1 tablet by mouth at bedtime.    [provider]  escitalopram (LEXAPRO) 10 MG tablet Take 10 mg by mouth daily. 07/21/17   [provider]  levETIRAcetam (KEPPRA) 500 MG tablet Take 1 tablet (500 mg total) by mouth 2 (two) times daily. 08/08/17   Enid Baas, MD  levothyroxine (SYNTHROID, LEVOTHROID) 75 MCG tablet Take 75 mcg  by mouth daily.    [provider]  losartan-hydrochlorothiazide (HYZAAR) 50-12.5 MG tablet Take 0.5 tablets by mouth daily. 06/01/17   [provider]  metoprolol tartrate (LOPRESSOR) 25 MG tablet Take 25 mg by mouth 2 (two) times daily.    [provider]  nystatin cream (MYCOSTATIN) Apply 1 application topically 2 (two) times daily. 07/13/17   [provider]  pantoprazole (PROTONIX) 40 MG tablet Take 40 mg by mouth 2 (two) times daily.    [provider]  risedronate (ACTONEL) 35 MG tablet Take 35 mg by mouth every 7 (seven) days. Take with full glass of water. Do not lie down for the next 30 minutes. 07/15/17   [provider]  simvastatin (ZOCOR) 40 MG tablet Take 40 mg by mouth at bedtime.    [provider]    Allergies Amoxicillin; Latex; and Lisinopril  Family History  Problem Relation Age of Onset  . CAD Father     Social History Social History   Tobacco Use  . Smoking status: Never Smoker  . Smokeless tobacco: Never Used  Substance Use Topics  . Alcohol use: No  . Drug use: No    Review of Systems  Constitutional: No fever/chills Eyes: No visual changes. ENT: No sore throat. Cardiovascular: Denies chest pain. Respiratory: Denies shortness of breath. Gastrointestinal: No abdominal pain.  No nausea, no vomiting.  No diarrhea.  No constipation. Genitourinary: Negative for dysuria. Musculoskeletal: Negative for back pain. Skin: Negative for  rash. Neurological: Negative for headaches, focal weakness or numbness.  However, history is limited secondary to altered mentation.   ____________________________________________   PHYSICAL EXAM:  VITAL SIGNS: ED Triage Vitals  Enc Vitals Group     BP 09/17/17 1829 (!) 141/65     Pulse Rate 09/17/17 1829 61     Resp 09/17/17 1829 20     Temp 09/17/17 1829 97.7 F (36.5 C)     Temp Source 09/17/17 1829 Oral     SpO2 09/17/17 1829 99 %     Weight 09/17/17  1827 183 lb (83 kg)     Height 09/17/17 1827 5\' 3"  (1.6 m)     Head Circumference --      Peak Flow --      Pain Score 09/17/17 1827 0     Pain Loc --      Pain Edu? --      Excl. in GC? --     Constitutional: Alert and oriented to self as well as place. Well appearing and in no acute distress. Eyes: Conjunctivae are normal.  Head: Atraumatic. Nose: No congestion/rhinnorhea. Mouth/Throat: Mucous membranes are moist.  Neck: No stridor.   Cardiovascular: Normal rate, regular rhythm. Grossly normal heart sounds.   Respiratory: Normal respiratory effort.  No retractions. Lungs CTAB. Gastrointestinal: Soft and nontender. No distention.  Musculoskeletal: No lower extremity tenderness nor edema.  No joint effusions. Neurologic:  Normal speech and language. No gross focal neurologic deficits are appreciated. Skin:  Skin is warm, dry and intact. No rash noted. Psychiatric: Mood and affect are normal. Speech and behavior are normal.  ____________________________________________   LABS (all labs ordered are listed, but only abnormal results are displayed)  Labs Reviewed  CBC WITH DIFFERENTIAL/PLATELET - Abnormal; Notable for the following components:      Result Value   Hemoglobin 11.7 (*)    HCT 34.5 (*)    RDW 16.6 (*)    All other components within normal limits  COMPREHENSIVE METABOLIC PANEL - Abnormal; Notable for the following components:   Glucose, Bld 103 (*)    Total Protein 6.4 (*)    GFR calc non Af Amer 52 (*)    GFR calc Af Amer 60 (*)    All other components within normal limits  URINALYSIS, COMPLETE (UACMP) WITH MICROSCOPIC - Abnormal; Notable for the following components:   Color, Urine YELLOW (*)    APPearance CLEAR (*)    All other components within normal limits  TROPONIN I   ____________________________________________  EKG ED ECG REPORT I, Arelia Longest, the attending physician, personally viewed and interpreted this ECG.   Date: 09/17/2017  EKG  Time: 1924  Rate: 64  Rhythm: normal sinus rhythm with PACs  Axis: Normal  Intervals:Prolonged QTC at 549  ST&T Change: No ST segment elevation or depression.  No abnormal T wave inversion.  ____________________________________________  RADIOLOGY  CT head without acute process. ____________________________________________   PROCEDURES  Procedure(s) performed:   Procedures  Critical Care performed:   ____________________________________________   INITIAL IMPRESSION / ASSESSMENT AND PLAN / ED COURSE  Pertinent labs & imaging results that were available during my care of the patient were reviewed by me and considered in my medical decision making (see chart for details).  Differential diagnosis includes, but is not limited to, alcohol, illicit or prescription medications, or other toxic ingestion; intracranial pathology such as stroke or intracerebral hemorrhage; fever or infectious causes including sepsis; hypoxemia and/or hypercarbia; uremia; trauma; endocrine related disorders  such as diabetes, hypoglycemia, and thyroid-related diseases; hypertensive encephalopathy; etc. As part of my medical decision making, I reviewed the following data within the electronic MEDICAL RECORD NUMBER Notes from prior ED visits   ----------------------------------------- 7:31 PM on 09/17/2017 -----------------------------------------  Patient at this time still with unchanged altered mental status.  Unknown last normal.  No obvious cause for patient's altered mentation.  Normal CT.  Neurological abnormality.  Urinalysis does not indicate UTI.  She will need to be admitted to the hospital for further work-up for altered mental status.  I will also add on a TSH.  Signed out to Dr. Nancy MarusMayo.  ----------------------------------------- 8:17 PM on 09/17/2017 -----------------------------------------  Patient's heart rate found to be in the 40s.  Dropping multiple beats.  New from previous.  Also with  prolonged QT interval.  Will give magnesium.  Hospitalist updated.  Family also now at the bedside and states that the patient was recently diagnosed with seizure several weeks ago and was started on 500 twice daily of Keppra but because of somnolence was decreased to 250 twice daily.  Family suspected that the patient may have seized earlier today and then been postictal thereafter.  There is say that she is back to her new baseline over the past 2 weeks or so.  They have noticed a sharp decline in the patient's mental status ever since the death of her husband approximately 6 weeks ago.  However, patient now with heart rate persistently in the 40s.  I discussed with the family possibly trying magnesium and then reassessing and discharging the patient home.  But they state that they are uncomfortable with that even though the patient is a DNR status.  Patient will continue to be admitted overnight for observation.   ____________________________________________   FINAL CLINICAL IMPRESSION(S) / ED DIAGNOSES  Altered mental status.  NEW MEDICATIONS STARTED DURING THIS VISIT:  New Prescriptions   No medications on file     Note:  This document was prepared using Dragon voice recognition software and may include unintentional dictation errors.     Myrna BlazerSchaevitz, David Matthew, MD 09/17/17 1931    Pershing ProudSchaevitz, Myra Rudeavid Matthew, MD 09/17/17 2019

## 2017-09-17 NOTE — ED Notes (Signed)
Patient transported to CT 

## 2017-09-17 NOTE — ED Notes (Signed)
[facility]

## 2017-09-17 NOTE — ED Triage Notes (Signed)
Pt brought in via ems from [facility].  Pt sent for eval of altered mental status.  pt alert on arrival.  md with pt.  Iv in place

## 2017-09-18 ENCOUNTER — Inpatient Hospital Stay: Payer: Medicare Other

## 2017-09-18 ENCOUNTER — Inpatient Hospital Stay (HOSPITAL_COMMUNITY)
Admit: 2017-09-18 | Discharge: 2017-09-18 | Disposition: A | Discharge status: Home / Self Care | Payer: Medicare Other | Attending: Internal Medicine | Admitting: Internal Medicine

## 2017-09-18 DIAGNOSIS — R4182 Altered mental status, unspecified: Secondary | ICD-10-CM

## 2017-09-18 DIAGNOSIS — R001 Bradycardia, unspecified: Secondary | ICD-10-CM

## 2017-09-18 DIAGNOSIS — I5033 Acute on chronic diastolic (congestive) heart failure: Secondary | ICD-10-CM

## 2017-09-18 DIAGNOSIS — I10 Essential (primary) hypertension: Secondary | ICD-10-CM

## 2017-09-18 DIAGNOSIS — I5030 Unspecified diastolic (congestive) heart failure: Secondary | ICD-10-CM

## 2017-09-18 DIAGNOSIS — I472 Ventricular tachycardia: Secondary | ICD-10-CM

## 2017-09-18 LAB — CBC
HEMATOCRIT: 34.4 % — AB (ref 35.0–47.0)
Hemoglobin: 11.6 g/dL — ABNORMAL LOW (ref 12.0–16.0)
MCH: 28.6 pg (ref 26.0–34.0)
MCHC: 33.8 g/dL (ref 32.0–36.0)
MCV: 84.5 fL (ref 80.0–100.0)
Platelets: 202 10*3/uL (ref 150–440)
RBC: 4.07 MIL/uL (ref 3.80–5.20)
RDW: 16.9 % — ABNORMAL HIGH (ref 11.5–14.5)
WBC: 6.5 10*3/uL (ref 3.6–11.0)

## 2017-09-18 LAB — BASIC METABOLIC PANEL
Anion gap: 7 (ref 5–15)
BUN: 18 mg/dL (ref 8–23)
CO2: 27 mmol/L (ref 22–32)
Calcium: 9.1 mg/dL (ref 8.9–10.3)
Chloride: 109 mmol/L (ref 98–111)
Creatinine, Ser: 0.76 mg/dL (ref 0.44–1.00)
GFR calc non Af Amer: >60 mL/min (ref >60)
Glucose, Bld: 83 mg/dL (ref 70–99)
POTASSIUM: 3.6 mmol/L (ref 3.5–5.1)
SODIUM: 143 mmol/L (ref 135–145)

## 2017-09-18 LAB — ECHOCARDIOGRAM COMPLETE
Height: 63 in
Weight: 2958.4 oz

## 2017-09-18 LAB — GLUCOSE, CAPILLARY: Glucose-Capillary: 81 mg/dL (ref 70–99)

## 2017-09-18 LAB — TSH: TSH: 1.447 u[IU]/mL (ref 0.350–4.500)

## 2017-09-18 LAB — MRSA PCR SCREENING: MRSA by PCR: NEGATIVE

## 2017-09-18 MED ORDER — TRAZODONE HCL 50 MG PO TABS
50.0000 mg | ORAL_TABLET | Freq: Every day | ORAL | Status: DC
  Administered 2017-09-18: 50 mg via ORAL
  Filled 2017-09-18: qty 1

## 2017-09-18 MED ORDER — METOPROLOL SUCCINATE ER 25 MG PO TB24
25.0000 mg | ORAL_TABLET | Freq: Every day | ORAL | Status: DC
  Administered 2017-09-19: 25 mg via ORAL
  Filled 2017-09-18: qty 1

## 2017-09-18 MED ORDER — LEVETIRACETAM 250 MG PO TABS
250.0000 mg | ORAL_TABLET | Freq: Two times a day (BID) | ORAL | Status: DC
  Administered 2017-09-18 – 2017-09-19 (×2): 250 mg via ORAL
  Filled 2017-09-18 (×3): qty 1

## 2017-09-18 MED ORDER — SODIUM CHLORIDE 0.9 % IV SOLN
INTRAVENOUS | Status: DC
  Administered 2017-09-18 (×2): via INTRAVENOUS

## 2017-09-18 NOTE — Consult Note (Addendum)
ELECTROPHYSIOLOGY CONSULT NOTE    Primary Care Physician: Dorothey Baseman, MD Referring Physician:  Dr Caryn Bee  Admit Date: 09/17/2017  Reason for consultation:  bradycardia Megan Donaldson is a 84 y.o. female with a h/o dementia, HTN, seizure disorder and prior bradycardia now admitted with confusion/ AMS.  Cardiology is consulted by Dr Caryn Bee for EP consultation regarding his bradycardia.  Per report, she was admitted with lethargy and worsening confusion.  She was evaluated in the ED and admitted to medicine for further evaluation.  She was noted to have sinus bradycardia upon admission.  Her daughter reports that she has had asymptomatic bradycardia in the past as well. Overnight, her heart rates have improved.  Presently, she is resting comfortably, without compliant.  Today, she denies symptoms of  chest pain, shortness of breath, orthopnea, lower extremity edema (above baseline), dizziness, presyncope, syncope, or new neurologic sequela.    Past Medical History:  Diagnosis Date  . CHF (congestive heart failure) (HCC)   . Dementia   . GERD (gastroesophageal reflux disease)   . Hyperlipidemia   . Hypertension   . Osteoporosis   . Personal history of other venous thrombosis and embolism   . Personal history of PE (pulmonary embolism)    Past Surgical History:  Procedure Laterality Date  . HIP CLOSED REDUCTION Right 12/21/2014   Procedure: CLOSED REDUCTION HIP;  Surgeon: Deeann Saint, MD;  Location: [facility] ORS;  Service: Orthopedics;  Laterality: Right;  . TOTAL HIP ARTHROPLASTY Right    x3    . aspirin EC  81 mg Oral Daily  . docusate sodium  100 mg Oral BID  . escitalopram  10 mg Oral Daily  . heparin  5,000 Units Subcutaneous Q8H  . levETIRAcetam  250 mg Oral BID  . levothyroxine  75 mcg Oral QAC breakfast  . metoprolol tartrate  25 mg Oral BID  . traZODone  50 mg Oral QHS   . sodium chloride 50 mL/hr at 09/18/17 1158    Allergies  Allergen Reactions  . Amoxicillin    Has patient had a PCN reaction causing immediate rash, facial/tongue/throat swelling, SOB or lightheadedness with hypotension: Unknown Has patient had a PCN reaction causing severe rash involving mucus membranes or skin necrosis: Unknown Has patient had a PCN reaction that required hospitalization: Unknown Has patient had a PCN reaction occurring within the last 10 years: Unknown If all of the above answers are "NO", then may proceed with Cephalosporin use.   . Latex   . Lisinopril     Social History   Socioeconomic History  . Marital status: Married    Spouse name: Not on file  . Number of children: Not on file  . Years of education: Not on file  . Highest education level: Not on file  Occupational History  . Not on file  Social Needs  . Financial resource strain: Not on file  . Food insecurity:    Worry: Not on file    Inability: Not on file  . Transportation needs:    Medical: Not on file    Non-medical: Not on file  Tobacco Use  . Smoking status: Never Smoker  . Smokeless tobacco: Never Used  Substance and Sexual Activity  . Alcohol use: No  . Drug use: No  . Sexual activity: Not on file  Lifestyle  . Physical activity:    Days per week: Not on file    Minutes per session: Not on file  . Stress: Not on file  Relationships  . Social connections:    Talks on phone: Not on file    Gets together: Not on file    Attends religious service: Not on file    Active member of club or organization: Not on file    Attends meetings of clubs or organizations: Not on file    Relationship status: Not on file  . Intimate partner violence:    Fear of current or ex partner: Not on file    Emotionally abused: Not on file    Physically abused: Not on file    Forced sexual activity: Not on file  Other Topics Concern  . Not on file  Social History Narrative  . Not on file    Family History  Problem Relation Age of Onset  . CAD Father     ROS- All systems are reviewed and  negative except as per the HPI above  Physical Exam: Telemetry:  Sinus rhythm with heart rates primarily in the 60s.  Occasional PACs, PVCs, and nonsustained VT (short) Vitals:   09/17/17 2200 09/17/17 2255 09/18/17 0524 09/18/17 0750  BP: (!) 141/93 137/66 (!) 129/58 134/75  Pulse:  69 72 77  Resp: (!) 22 19 18    Temp:  97.6 F (36.4 C) 97.8 F (36.6 C) 97.8 F (36.6 C)  TempSrc:  Oral  Oral  SpO2:  97% 97% 95%  Weight:  185 lb 9.6 oz (84.2 kg) 184 lb 14.4 oz (83.9 kg)   Height:        GEN- The patient is pleasant appearing, alert  Head- normocephalic, atraumatic Eyes-  Sclera clear, conjunctiva pink Ears- hearing intact Oropharynx- clear Neck- supple,   Lungs- Clear to ausculation bilaterally, normal work of breathing Heart- Regular rate and rhythm  GI- soft, NT, ND, + BS Extremities- no clubbing, cyanosis, or +1 edema MS- diffuse atrophy Skin- no rash or lesion Psych- euthymic mood, full affect Neuro- strength and sensation are intact  EKG- sinus bradycardia, appears to have sinus nodal exit block, QTc 401, no ischemic changes  Labs:   Lab Results  Component Value Date   WBC 6.5 09/18/2017   HGB 11.6 (L) 09/18/2017   HCT 34.4 (L) 09/18/2017   MCV 84.5 09/18/2017   PLT 202 09/18/2017    Recent Labs  Lab 09/17/17 1832 09/18/17 0512  NA 141 143  K 4.0 3.6  CL 107 109  CO2 26 27  BUN 23 18  CREATININE 0.96 0.76  CALCIUM 9.1 9.1  PROT 6.4*  --   BILITOT 0.4  --   ALKPHOS 60  --   ALT 13  --   AST 22  --   GLUCOSE 103* 83   Echo:  pending  ASSESSMENT AND PLAN:   1. Sinus bradycardia Improved with improvement in mental status.  I am not convinced that she is currently symptomatic.  Agree with stopping metoprolol.  No further workup/ management planned.  No indication for pacing at this time.  2. HTN Stable No change required today  3. Chronic diastolic dysfunction Volume appears stable No change required today 2 gram sodium diet Echo is  pending  4. NSVT Noted Qt is stable Avoid QT prolonging medicines as able. Not brady dependant Would keep K >3.9, Mg >1.9 Ideally would keep on low dose beta blocker however we have to avoid this currently due to bradycardia.  Could perhaps be restarted electively once heart rates are back to baseline.  Cardiology to see as needed while here Call  with questions.  Hillis RangeJames Koree Schopf, MD 09/18/2017  12:38 PM

## 2017-09-18 NOTE — Progress Notes (Signed)
*  PRELIMINARY RESULTS* Echocardiogram 2D Echocardiogram has been performed.  Megan Ridgelikeshia S Donaldson Megan Donaldson 09/18/2017, 8:47 AM

## 2017-09-18 NOTE — Consult Note (Signed)
Reason for Consult:confusion  Referring Physician: Dr. Renae Gloss   CC: confusion   HPI: Megan Donaldson is an 84 y.o. female  nursing home resident, with a known history of CHF, dementia, hypertension transferred from nursing home for lethargy and worsening confusion going on for the past 24 hours, gradually getting worse.  No reports of fever or chills, no vomiting, no cough, no chest pain or abdominal pain.  Per nursing home reports, Keppra dose was recently reduced from 500 mg twice a day to 250 mg twice a day.  Pt has hx of depression and recent anniversary of family loss.    Past Medical History:  Diagnosis Date  . CHF (congestive heart failure) (HCC)   . Dementia   . GERD (gastroesophageal reflux disease)   . Hyperlipidemia   . Hypertension   . Osteoporosis   . Personal history of other venous thrombosis and embolism   . Personal history of PE (pulmonary embolism)     Past Surgical History:  Procedure Laterality Date  . HIP CLOSED REDUCTION Right 12/21/2014   Procedure: CLOSED REDUCTION HIP;  Surgeon: Deeann Saint, MD;  Location: [facility] ORS;  Service: Orthopedics;  Laterality: Right;  . TOTAL HIP ARTHROPLASTY Right    x3    Family History  Problem Relation Age of Onset  . CAD Father     Social History:  reports that she has never smoked. She has never used smokeless tobacco. She reports that she does not drink alcohol or use drugs.  Allergies  Allergen Reactions  . Amoxicillin     Has patient had a PCN reaction causing immediate rash, facial/tongue/throat swelling, SOB or lightheadedness with hypotension: Unknown Has patient had a PCN reaction causing severe rash involving mucus membranes or skin necrosis: Unknown Has patient had a PCN reaction that required hospitalization: Unknown Has patient had a PCN reaction occurring within the last 10 years: Unknown If all of the above answers are "NO", then may proceed with Cephalosporin use.   . Latex   . Lisinopril      Medications: I have reviewed the patient's current medications.  ROS: Unable to obtain due to confusion .   Physical Examination: Blood pressure 134/75, pulse 77, temperature 97.8 F (36.6 C), temperature source Oral, resp. rate 18, height 5\' 3"  (1.6 m), weight 184 lb 14.4 oz (83.9 kg), SpO2 95 %.    Neurological Examination   Mental Status: Alert, oriented to name only . Cranial Nerves: II: Discs flat bilaterally; Visual fields grossly normal, pupils equal, round, reactive to light and accommodation III,IV, VI: ptosis not present, extra-ocular motions intact bilaterally V,VII: smile symmetric, facial light touch sensation normal bilaterally VIII: hearing normal bilaterally IX,X: gag reflex present XI: bilateral shoulder shrug XII: midline tongue extension Motor: Generalized weakness       Laboratory Studies:   Basic Metabolic Panel: Recent Labs  Lab 09/17/17 1832 09/18/17 0512  NA 141 143  K 4.0 3.6  CL 107 109  CO2 26 27  GLUCOSE 103* 83  BUN 23 18  CREATININE 0.96 0.76  CALCIUM 9.1 9.1  MG 2.1  --     Liver Function Tests: Recent Labs  Lab 09/17/17 1832  AST 22  ALT 13  ALKPHOS 60  BILITOT 0.4  PROT 6.4*  ALBUMIN 3.8   No results for input(s): LIPASE, AMYLASE in the last 168 hours. No results for input(s): AMMONIA in the last 168 hours.  CBC: Recent Labs  Lab 09/17/17 1832 09/18/17 0512  WBC 6.6  6.5  NEUTROABS 2.7  --   HGB 11.7* 11.6*  HCT 34.5* 34.4*  MCV 84.1 84.5  PLT 203 202    Cardiac Enzymes: Recent Labs  Lab 09/17/17 1832  TROPONINI <0.03    BNP: Invalid input(s): POCBNP  CBG: Recent Labs  Lab 09/18/17 0752  GLUCAP 81    Microbiology: Results for orders placed or performed during the hospital encounter of 09/17/17  MRSA PCR Screening     Status: None   Collection Time: 09/17/17 10:59 PM  Result Value Ref Range Status   MRSA by PCR NEGATIVE NEGATIVE Final    Comment:        The GeneXpert MRSA Assay  (FDA approved for NASAL specimens only), is one component of a comprehensive MRSA colonization surveillance program. It is not intended to diagnose MRSA infection nor to guide or monitor treatment for MRSA infections. Performed at [facility], [address] [place], [place] [address]     Coagulation Studies: No results for input(s): LABPROT, INR in the last 72 hours.  Urinalysis:  Recent Labs  Lab 09/17/17 1832  COLORURINE YELLOW*  LABSPEC 1.021  PHURINE 5.0  GLUCOSEU NEGATIVE  HGBUR NEGATIVE  BILIRUBINUR NEGATIVE  KETONESUR NEGATIVE  PROTEINUR NEGATIVE  NITRITE NEGATIVE  LEUKOCYTESUR NEGATIVE    Lipid Panel:  No results found for: CHOL, TRIG, HDL, CHOLHDL, VLDL, LDLCALC  HgbA1C: No results found for: HGBA1C  Urine Drug Screen:  No results found for: LABOPIA, COCAINSCRNUR, LABBENZ, AMPHETMU, THCU, LABBARB  Alcohol Level: No results for input(s): ETH in the last 168 hours.  Other results: EKG: normal EKG, normal sinus rhythm, unchanged from previous tracings.  Imaging: Ct Head Wo Contrast  Result Date: 09/17/2017 CLINICAL DATA:  Altered mental status. EXAM: CT HEAD WITHOUT CONTRAST TECHNIQUE: Contiguous axial images were obtained from the base of the skull through the vertex without intravenous contrast. COMPARISON:  Brain MRI 08/07/2017.  Head CT 02/11/2010 FINDINGS: Brain: The the There is no evidence for acute hemorrhage, hydrocephalus, mass lesion, or abnormal extra-axial fluid collection. No definite CT evidence for acute infarction. Diffuse loss of parenchymal volume is consistent with atrophy. Patchy low attenuation in the deep hemispheric and periventricular white matter is nonspecific, but likely reflects chronic microvascular ischemic demyelination. Vascular: No hyperdense vessel or unexpected calcification. Skull: No evidence for fracture. No worrisome lytic or sclerotic lesion. Sinuses/Orbits: The visualized paranasal sinuses and mastoid air cells  are clear. Visualized portions of the globes and intraorbital fat are unremarkable. Other: None. IMPRESSION: 1. No acute intracranial abnormality. 2. Atrophy with chronic small vessel white matter ischemic disease. Electronically Signed   By: Kennith Center M.D.   On: 09/17/2017 19:13   Dg Chest Port 1 View  Result Date: 09/18/2017 CLINICAL DATA:  Heart failure EXAM: PORTABLE CHEST 1 VIEW COMPARISON:  6/22/9 FINDINGS: Cardiac shadow is stable. Thoracic aorta is tortuous. Lungs are well aerated bilaterally without focal infiltrate or sizable effusion. Upper abdomen demonstrates a rim calcification in the left upper quadrant likely related to splenic artery aneurysm. This is stable from the prior exams. IMPRESSION: No acute abnormality identified. Electronically Signed   By: Alcide Clever M.D.   On: 09/18/2017 07:55     Assessment/Plan:  84 y.o. female  nursing home resident, with a known history of CHF, dementia, hypertension transferred from nursing home for lethargy and worsening confusion going on for the past 24 hours, gradually getting worse.  No reports of fever or chills, no vomiting, no cough, no chest pain or  abdominal pain.  Per nursing home reports, Keppra dose was recently reduced from 500 mg twice a day to 250 mg twice a day.  Pt has hx of depression and recent anniversary of family loss.    - CTH no acute abnormalities - Pt has had improved as per family since admission  - Pt had one episode of seizure in the past after fall and been seen by Dr. Sherryll BurgerShah as out pt - recent decrease in Keppra due to somnolence which helped - Con't keppra 250 BID and follow up by Dr. Sherryll BurgerShah for likely taking it off  - don't think seizure related activity - observation and likely d/c in next few days.   09/18/2017, 2:22 PM

## 2017-09-18 NOTE — Progress Notes (Signed)
PT Cancellation Note  Patient Details Name: Megan PillowRuth M Mohrmann MRN: [id number] DOB: [birth year]   Cancelled Treatment:    Reason Eval/Treat Not Completed: Other (comment)(Patient sleeping. Not able to rouse. ) Patient sleeping upon PT arrival. Unable to rouse. Will attempt to see at later time/date.   Precious BardMarina Amarrah Meinhart, PT, DPT   09/18/2017, 1:52 PM

## 2017-09-18 NOTE — Progress Notes (Signed)
PT Cancellation Note  Patient Details Name: Megan Donaldson MRN: [id number] DOB: 09/09/1927   Cancelled Treatment:    Reason Eval/Treat Not Completed: Other (comment)(Patient sleeping, unable to rouse). Patient continues to sleep. Unable to rouse despite frequent attempts.  Attempted 3x at different times of day to perform evaluation without success. Will attempt again at a later time/date.    Precious BardMarina Kendarious Gudino, PT, DPT   09/18/2017, 3:46 PM

## 2017-09-18 NOTE — Progress Notes (Signed)
Patient ID: Megan Donaldson, female   DOB: 29-Oct-1927, 84 y.o.   MRN: [id number]  [employer] PROGRESS NOTE  Megan Donaldson NWG:[id number] DOB: [birth year] DOA: 09/17/2017 PCP: Dorothey Baseman, MD  HPI/Subjective: Patient brought in with confusion and not knowing her name yesterday.  As per daughter she has had a decline with her dementia and mental status over the past 6 weeks.  Her husband died 6 weeks ago.  She was also placed in a new assisted living.  Patient answering a few questions today but some of her answers are a little strange.  Objective: Vitals:   09/18/17 0524 09/18/17 0750  BP: (!) 129/58 134/75  Pulse: 72 77  Resp: 18   Temp: 97.8 F (36.6 C) 97.8 F (36.6 C)  SpO2: 97% 95%    Filed Weights   09/17/17 1827 09/17/17 2255 09/18/17 0524  Weight: 83 kg (183 lb) 84.2 kg (185 lb 9.6 oz) 83.9 kg (184 lb 14.4 oz)    ROS: Review of Systems  Unable to perform ROS: Dementia  Respiratory: Negative for shortness of breath.   Cardiovascular: Negative for chest pain.  Gastrointestinal: Negative for abdominal pain.  Musculoskeletal: Negative for joint pain.   Exam: Physical Exam  Constitutional: She is oriented to person, place, and time.  HENT:  Nose: No mucosal edema.  Mouth/Throat: No oropharyngeal exudate or posterior oropharyngeal edema.  Eyes: Pupils are equal, round, and reactive to light. Conjunctivae, EOM and lids are normal.  Neck: No JVD present. Carotid bruit is not present. No edema present. No thyroid mass and no thyromegaly present.  Cardiovascular: S1 normal and S2 normal. Exam reveals no gallop.  No murmur heard. Pulses:      Dorsalis pedis pulses are 2+ on the right side, and 2+ on the left side.  Respiratory: No respiratory distress. She has no wheezes. She has no rhonchi. She has no rales.  GI: Soft. Bowel sounds are normal. There is no tenderness.  Musculoskeletal:       Right ankle: She exhibits swelling.       Left ankle: She exhibits swelling.   Lymphadenopathy:    She has no cervical adenopathy.  Neurological: She is alert and oriented to person, place, and time. No cranial nerve deficit.  Skin: Skin is warm. No rash noted. Nails show no clubbing.  Psychiatric: Her speech is tangential.  Answers some questions appropriately and is tangential with some of the other answers.      Data Reviewed: Basic Metabolic Panel: Recent Labs  Lab 09/17/17 1832 09/18/17 0512  NA 141 143  K 4.0 3.6  CL 107 109  CO2 26 27  GLUCOSE 103* 83  BUN 23 18  CREATININE 0.96 0.76  CALCIUM 9.1 9.1  MG 2.1  --    Liver Function Tests: Recent Labs  Lab 09/17/17 1832  AST 22  ALT 13  ALKPHOS 60  BILITOT 0.4  PROT 6.4*  ALBUMIN 3.8   CBC: Recent Labs  Lab 09/17/17 1832 09/18/17 0512  WBC 6.6 6.5  NEUTROABS 2.7  --   HGB 11.7* 11.6*  HCT 34.5* 34.4*  MCV 84.1 84.5  PLT 203 202   Cardiac Enzymes: Recent Labs  Lab 09/17/17 1832  TROPONINI <0.03    CBG: Recent Labs  Lab 09/18/17 0752  GLUCAP 81    Recent Results (from the past 240 hour(s))  MRSA PCR Screening     Status: None   Collection Time: 09/17/17 10:59 PM  Result Value Ref Range  Status   MRSA by PCR NEGATIVE NEGATIVE Final    Comment:        The GeneXpert MRSA Assay (FDA approved for NASAL specimens only), is one component of a comprehensive MRSA colonization surveillance program. It is not intended to diagnose MRSA infection nor to guide or monitor treatment for MRSA infections. Performed at [facility], [address], [place], [place] [address]      Studies: Ct Head Wo Contrast  Result Date: 09/17/2017 CLINICAL DATA:  Altered mental status. EXAM: CT HEAD WITHOUT CONTRAST TECHNIQUE: Contiguous axial images were obtained from the base of the skull through the vertex without intravenous contrast. COMPARISON:  Brain MRI 08/07/2017.  Head CT 02/11/2010 FINDINGS: Brain: The the There is no evidence for acute hemorrhage, hydrocephalus, mass  lesion, or abnormal extra-axial fluid collection. No definite CT evidence for acute infarction. Diffuse loss of parenchymal volume is consistent with atrophy. Patchy low attenuation in the deep hemispheric and periventricular white matter is nonspecific, but likely reflects chronic microvascular ischemic demyelination. Vascular: No hyperdense vessel or unexpected calcification. Skull: No evidence for fracture. No worrisome lytic or sclerotic lesion. Sinuses/Orbits: The visualized paranasal sinuses and mastoid air cells are clear. Visualized portions of the globes and intraorbital fat are unremarkable. Other: None. IMPRESSION: 1. No acute intracranial abnormality. 2. Atrophy with chronic small vessel white matter ischemic disease. Electronically Signed   By: Kennith Center M.D.   On: 09/17/2017 19:13   Dg Chest Port 1 View  Result Date: 09/18/2017 CLINICAL DATA:  Heart failure EXAM: PORTABLE CHEST 1 VIEW COMPARISON:  6/22/9 FINDINGS: Cardiac shadow is stable. Thoracic aorta is tortuous. Lungs are well aerated bilaterally without focal infiltrate or sizable effusion. Upper abdomen demonstrates a rim calcification in the left upper quadrant likely related to splenic artery aneurysm. This is stable from the prior exams. IMPRESSION: No acute abnormality identified. Electronically Signed   By: Alcide Clever M.D.   On: 09/18/2017 07:55    Scheduled Meds: . aspirin EC  81 mg Oral Daily  . docusate sodium  100 mg Oral BID  . escitalopram  10 mg Oral Daily  . heparin  5,000 Units Subcutaneous Q8H  . levETIRAcetam  250 mg Oral BID  . levothyroxine  75 mcg Oral QAC breakfast  . [START ON 09/19/2017] metoprolol succinate  25 mg Oral Daily  . traZODone  50 mg Oral QHS   Continuous Infusions: . sodium chloride 50 mL/hr at 09/18/17 1158    Assessment/Plan:  1. Acute delirium on top of dementia.  Try to get the patient sleeping tonight with trazodone.  Gentle IV fluids.  No signs of infection.  Likely brought on  with the death of her husband and change in new place of living.  Continue to monitor. 2. Bradycardia.  Decrease dose of metoprolol down to 25 mg of XL on a daily basis 3. Recent seizure.  Decrease Keppra back down to 250 mg twice daily since this caused her to be somnolent at the higher dose as outpatient.  Follow-up with Dr. Sherryll Burger as outpatient. 4. Depression on Lexapro 5. Essential hypertension decrease the dose of metoprolol  Code Status:     Code Status Orders  (From admission, onward)        Start     Ordered   09/17/17 2301  Do not attempt resuscitation (DNR)  Continuous    Question Answer Comment  In the event of cardiac or respiratory ARREST Do not call a "code blue"   In  the event of cardiac or respiratory ARREST Do not perform Intubation, CPR, defibrillation or ACLS   In the event of cardiac or respiratory ARREST Use medication by any route, position, wound care, and other measures to relive pain and suffering. May use oxygen, suction and manual treatment of airway obstruction as needed for comfort.      09/17/17 2300    Code Status History    Date Active Date Inactive Code Status Order ID Comments User Context   08/07/2017 1553 08/08/2017 1938 DNR [id number]  Milagros LollSudini, Srikar, MD ED   08/07/2017 1528 08/07/2017 1553 DNR [id number]  Willy Eddyobinson, Patrick, MD ED    Advance Directive Documentation     Most Recent Value  Type of Advance Directive  Out of facility DNR (pink MOST or yellow form)  Pre-existing out of facility DNR order (yellow form or pink MOST form)  Physician notified to receive inpatient order  "MOST" Form in Place?  -     Family Communication: Spoke with daughter on the phone Disposition Plan: To be determined  Consultants:  Neurology  Cardiology  Time spent: 30 minutes.  In discussing patient with neurology and patient's daughter and nursing staff.  Millisa Giarrusso [employer]  [employer]

## 2017-09-19 LAB — GLUCOSE, CAPILLARY: GLUCOSE-CAPILLARY: 87 mg/dL (ref 70–99)

## 2017-09-19 MED ORDER — TRAZODONE HCL 50 MG PO TABS: 50.0000 mg | ORAL_TABLET | Freq: Every day | ORAL | 0 refills | Status: DC

## 2017-09-19 MED ORDER — LEVETIRACETAM 250 MG PO TABS: 250.0000 mg | ORAL_TABLET | Freq: Two times a day (BID) | ORAL | Status: DC

## 2017-09-19 MED ORDER — METOPROLOL SUCCINATE ER 25 MG PO TB24: 25.0000 mg | ORAL_TABLET | Freq: Every day | ORAL | 0 refills | Status: DC

## 2017-09-19 NOTE — Plan of Care (Signed)
Patient is progressing.  

## 2017-09-19 NOTE — [place] FL2 (Addendum)
[place] MEDICAID FL2 LEVEL OF CARE SCREENING TOOL     IDENTIFICATION  Patient Name: Megan Donaldson Birthdate: 09/17/1927 Sex: female Admission Date (Current Location): 09/17/2017  [place] and [place] Number:  Chiropodist and Address:  [facility], [address] [place], [place] [address]      Provider Number: [id number]  Attending Physician Name and Address:  Alford Highland, MD  Relative Name and Phone Number:  Barton Fanny (Daughter) [phone]    Current Level of Care: Hospital Recommended Level of Care: Assisted Living Facility Prior Approval Number:    Date Approved/Denied:   PASRR Number:    Discharge Plan: Domiciliary (Rest home)    Current Diagnoses: Patient Active Problem List   Diagnosis Date Noted  . Altered mental status 09/17/2017  . Seizure (HCC) 08/07/2017    Orientation RESPIRATION BLADDER Height & Weight     Self, Time, Situation, Place  Normal Incontinent Weight: 181 lb 4.8 oz (82.2 kg) Height:  5\' 3"  (160 cm)  BEHAVIORAL SYMPTOMS/MOOD NEUROLOGICAL BOWEL NUTRITION STATUS    Convulsions/Seizures Continent Diet(Heart healthy)  AMBULATORY STATUS COMMUNICATION OF NEEDS Skin   Supervision Verbally Normal                       Personal Care Assistance Level of Assistance  Bathing, Feeding, Dressing Bathing Assistance: Limited assistance Feeding assistance: Independent Dressing Assistance: Limited assistance     Functional Limitations Info  Sight, Hearing, Speech Sight Info: Adequate Hearing Info: Adequate Speech Info: Adequate    SPECIAL CARE FACTORS FREQUENCY                       Contractures Contractures Info: Not present    Additional Factors Info  Code Status, Allergies, Psychotropic Code Status Info: DNR Allergies Info: Amoxicillin, Latex, Lisinopril Psychotropic Info: Keppra, Lexapro         Current Medications (09/19/2017):  This is the current hospital active  medication list Current Facility-Administered Medications  Medication Dose Route Frequency Provider Last Rate Last Dose  . 0.9 %  sodium chloride infusion   Intravenous Continuous Alford Highland, MD 50 mL/hr at 09/18/17 2224    . acetaminophen (TYLENOL) tablet 650 mg  650 mg Oral Q6H PRN Cammy Copa, MD   650 mg at 09/18/17 1200   Or  . acetaminophen (TYLENOL) suppository 650 mg  650 mg Rectal Q6H PRN Cammy Copa, MD      . aspirin EC tablet 81 mg  81 mg Oral Daily Cammy Copa, MD   81 mg at 09/19/17 1031  . bisacodyl (DULCOLAX) EC tablet 5 mg  5 mg Oral Daily PRN Cammy Copa, MD      . docusate sodium (COLACE) capsule 100 mg  100 mg Oral BID Cammy Copa, MD   100 mg at 09/19/17 1031  . escitalopram (LEXAPRO) tablet 10 mg  10 mg Oral Daily Cammy Copa, MD   10 mg at 09/19/17 1031  . heparin injection 5,000 Units  5,000 Units Subcutaneous Q8H Cammy Copa, MD   5,000 Units at 09/19/17 0600  . HYDROcodone-acetaminophen (NORCO/VICODIN) 5-325 MG per tablet 1-2 tablet  1-2 tablet Oral Q4H PRN Cammy Copa, MD      . levETIRAcetam (KEPPRA) tablet 250 mg  250 mg Oral BID Alford Highland, MD   250 mg at 09/19/17 1031  . levothyroxine (SYNTHROID, LEVOTHROID) tablet 75 mcg  75 mcg Oral QAC breakfast Cammy Copa, MD   75 mcg at  09/19/17 0845  . metoprolol succinate (TOPROL-XL) 24 hr tablet 25 mg  25 mg Oral Daily Alford HighlandWieting, Richard, MD   25 mg at 09/19/17 1031  . ondansetron (ZOFRAN) tablet 4 mg  4 mg Oral Q6H PRN Cammy CopaMaier, Angela, MD       Or  . ondansetron [facility](ZOFRAN) injection 4 mg  4 mg Intravenous Q6H PRN Cammy CopaMaier, Angela, MD      . polyethylene glycol (MIRALAX / GLYCOLAX) packet 17 g  17 g Oral Daily PRN Cammy CopaMaier, Angela, MD      . traZODone (DESYREL) tablet 50 mg  50 mg Oral QHS Alford HighlandWieting, Richard, MD   50 mg at 09/18/17 2207     Discharge Medications: Medication List    STOP taking these medications   metoprolol tartrate 25 MG tablet Commonly known as:  LOPRESSOR     TAKE these  medications   acetaminophen 500 MG tablet Commonly known as:  TYLENOL Take 1,000 mg by mouth 2 (two) times daily.   aspirin EC 81 MG tablet Take 1 tablet by mouth daily.   escitalopram 10 MG tablet Commonly known as:  LEXAPRO Take 10 mg by mouth daily.   levETIRAcetam 250 MG tablet Commonly known as:  KEPPRA Take 1 tablet (250 mg total) by mouth 2 (two) times daily. What changed:    medication strength  how much to take   levothyroxine 75 MCG tablet Commonly known as:  SYNTHROID, LEVOTHROID Take 75 mcg by mouth daily.   metoprolol succinate 25 MG 24 hr tablet Commonly known as:  TOPROL-XL Take 1 tablet (25 mg total) by mouth daily. Start taking on:  09/20/2017   PEG 3350 Powd Take 17 g by mouth daily as needed.   risedronate 35 MG tablet Commonly known as:  ACTONEL Take 35 mg by mouth every 7 (seven) days. Take with full glass of water. Do not lie down for the next 30 minutes.   traZODone 50 MG tablet Commonly known as:  DESYREL Take 1 tablet (50 mg total) by mouth at bedtime.      Relevant Imaging Results:  Relevant Lab Results:   Additional Information    Judi CongKaren M Sullivan Jacuinde, LCSW

## 2017-09-19 NOTE — Progress Notes (Signed)
Physical Therapy Evaluation Patient Details Name: Megan PillowRuth M Ammirati MRN: [id number] DOB: Mar 24, 1927 Today's Date: 09/19/2017   History of Present Illness  Megan Donaldson  is a 84 y.o. female, nursing home resident, with a known history of CHF, dementia, hypertension and other comorbidities.  Clinical Impression  Patient performs bed mobility independently including rolling, supine to sit and sit to supine. She needs min asssit with transfers from low surfaces and modified independent from higher surfaces with RW. She ambulates 200 feet with RW and modified independence. She has dynamic standing balance deficits and is not able to stand in tandem, or single leg stand.Her strength is 3/5 BLE.  She will be DC from hospital today and return to assisted living and HHPT is recommended.     Follow Up Recommendations Home health PT    Equipment Recommendations       Recommendations for Other Services       Precautions / Restrictions Precautions Precautions: Fall Restrictions Weight Bearing Restrictions: Yes      Mobility  Bed Mobility Overal bed mobility: Independent                Transfers Overall transfer level: Modified independent Equipment used: Rolling walker (2 wheeled)                Ambulation/Gait Ambulation/Gait assistance: Modified independent (Device/Increase time) Gait Distance (Feet): (200) Assistive device: Rolling walker (2 wheeled) Gait Pattern/deviations: Step-to pattern     General Gait Details: (RW too far fwd)  Careers information officertairs            Wheelchair Mobility    Modified Rankin (Stroke Patients Only)       Balance Overall balance assessment: Mild deficits observed, not formally tested                                           Pertinent Vitals/Pain Pain Assessment: No/denies pain    Home Living Family/patient expects to be discharged to:: Assisted living               Home Equipment: Walker - 4 wheels      Prior  Function Level of Independence: Independent with assistive device(s)               Hand Dominance        Extremity/Trunk Assessment   Upper Extremity Assessment Upper Extremity Assessment: Overall WFL for tasks assessed    Lower Extremity Assessment Lower Extremity Assessment: Overall WFL for tasks assessed       Communication   Communication: No difficulties  Cognition Arousal/Alertness: Awake/alert Behavior During Therapy: WFL for tasks assessed/performed Overall Cognitive Status: Within Functional Limits for tasks assessed                                        General Comments      Exercises     Assessment/Plan    PT Assessment Patient needs continued PT services  PT Problem List         PT Treatment Interventions Gait training;Therapeutic exercise    PT Goals (Current goals can be found in the Care Plan section)  Acute Rehab PT Goals Patient Stated Goal: to go home PT Goal Formulation: With patient Time For Goal Achievement: 10/10/17 Potential to Achieve Goals: Good  Frequency Min 2X/week   Barriers to discharge        Co-evaluation               AM-PAC PT "6 Clicks" Daily Activity  Outcome Measure Difficulty turning over in bed (including adjusting bedclothes, sheets and blankets)?: None Difficulty moving from lying on back to sitting on the side of the bed? : None Difficulty sitting down on and standing up from a chair with arms (e.g., wheelchair, bedside commode, etc,.)?: A Little Help needed moving to and from a bed to chair (including a wheelchair)?: A Little Help needed walking in hospital room?: A Little Help needed climbing 3-5 steps with a railing? : A Little 6 Click Score: 20    End of Session Equipment Utilized During Treatment: Gait belt   Patient left: in bed;with bed alarm set   PT Visit Diagnosis: Unsteadiness on feet (R26.81);Muscle weakness (generalized) (M62.81)    Time: 1610-9604(5409) PT  Time Calculation (min) (ACUTE ONLY): 35 min   Charges:   PT Evaluation $PT Eval Low Complexity: 1 Low PT Treatments $Gait Training: 8-22 mins $Therapeutic Activity: 8-22 mins          Ezekiel Ina, PT DPT 09/19/2017, 3:57 PM

## 2017-09-19 NOTE — Progress Notes (Signed)
Pt to be discharged today. Iv and tele removed. Family to transport back to [facility].

## 2017-09-19 NOTE — Discharge Summary (Signed)
[employer] [place] [facility]   PATIENT NAME: Megan Donaldson    MR#:  [id number]  DATE OF BIRTH:  08-Sep-1927  DATE OF ADMISSION:  09/17/2017 ADMITTING PHYSICIAN: Cammy Copa, MD  DATE OF DISCHARGE: 09/19/2017  PRIMARY CARE PHYSICIAN: Dorothey Baseman, MD    ADMISSION DIAGNOSIS:  Altered mental status, unspecified altered mental status type [R41.82]  DISCHARGE DIAGNOSIS:  Active Problems:   Altered mental status   SECONDARY DIAGNOSIS:   Past Medical History:  Diagnosis Date  . CHF (congestive heart failure) (HCC)   . Dementia   . GERD (gastroesophageal reflux disease)   . Hyperlipidemia   . Hypertension   . Osteoporosis   . Personal history of other venous thrombosis and embolism   . Personal history of PE (pulmonary embolism)     HOSPITAL COURSE:   1.  Acute delirium on top of dementia.  We gave IV fluids.  There were no signs of infection.  The patient had 2 recent major stressors including the death of her husband 6 weeks ago and a new change of place of living.  I gave the patient trazodone at night which will help her sleep and increase her mood.  Mental status as per daughter seems better.  Physical therapy recommends home with home health. 2.  Bradycardia decrease the dose of metoprolol to Toprol-XL 25 mg daily 3.  Recent seizure.  Keppra decreased to 250 mg twice a day as outpatient by Dr. Sherryll Burger.  Hopefully at some point can get off of this medication. 4.  Depression on Lexapro 5.  Essential hypertension on decreased dose of metoprolol 6.  Weakness.  Physical therapy recommends home with home health.  Patient lives at assisted living.  Daughter will take the patient home.  Home health set up. 7.  Patient is a DO NOT RESUSCITATE  DISCHARGE CONDITIONS:   Fair.  CONSULTS OBTAINED:  Treatment Team:  Hillis Range, MD Pauletta Browns, MD  DRUG ALLERGIES:   Allergies  Allergen Reactions  . Amoxicillin     Has patient had a PCN reaction  causing immediate rash, facial/tongue/throat swelling, SOB or lightheadedness with hypotension: Unknown Has patient had a PCN reaction causing severe rash involving mucus membranes or skin necrosis: Unknown Has patient had a PCN reaction that required hospitalization: Unknown Has patient had a PCN reaction occurring within the last 10 years: Unknown If all of the above answers are "NO", then may proceed with Cephalosporin use.   . Latex   . Lisinopril     DISCHARGE MEDICATIONS:   Allergies as of 09/19/2017      Reactions   Amoxicillin    Has patient had a PCN reaction causing immediate rash, facial/tongue/throat swelling, SOB or lightheadedness with hypotension: Unknown Has patient had a PCN reaction causing severe rash involving mucus membranes or skin necrosis: Unknown Has patient had a PCN reaction that required hospitalization: Unknown Has patient had a PCN reaction occurring within the last 10 years: Unknown If all of the above answers are "NO", then may proceed with Cephalosporin use.   Latex    Lisinopril       Medication List    STOP taking these medications   metoprolol tartrate 25 MG tablet Commonly known as:  LOPRESSOR     TAKE these medications   acetaminophen 500 MG tablet Commonly known as:  TYLENOL Take 1,000 mg by mouth 2 (two) times daily.   aspirin EC 81 MG tablet Take 1 tablet by mouth daily.  escitalopram 10 MG tablet Commonly known as:  LEXAPRO Take 10 mg by mouth daily.   levETIRAcetam 250 MG tablet Commonly known as:  KEPPRA Take 1 tablet (250 mg total) by mouth 2 (two) times daily. What changed:    medication strength  how much to take   levothyroxine 75 MCG tablet Commonly known as:  SYNTHROID, LEVOTHROID Take 75 mcg by mouth daily.   metoprolol succinate 25 MG 24 hr tablet Commonly known as:  TOPROL-XL Take 1 tablet (25 mg total) by mouth daily. Start taking on:  09/20/2017   PEG 3350 Powd Take 17 g by mouth daily as needed.    risedronate 35 MG tablet Commonly known as:  ACTONEL Take 35 mg by mouth every 7 (seven) days. Take with full glass of water. Do not lie down for the next 30 minutes.   traZODone 50 MG tablet Commonly known as:  DESYREL Take 1 tablet (50 mg total) by mouth at bedtime.        DISCHARGE INSTRUCTIONS:   Follow-up PMD 6 days Follow-up with Dr. Sherryll Burger neurology in a few weeks  If you experience worsening of your admission symptoms, develop shortness of breath, life threatening emergency, suicidal or homicidal thoughts you must seek medical attention immediately by calling 911 or calling your MD immediately  if symptoms less severe.  You Must read complete instructions/literature along with all the possible adverse reactions/side effects for all the Medicines you take and that have been prescribed to you. Take any new Medicines after you have completely understood and accept all the possible adverse reactions/side effects.   Please note  You were cared for by a hospitalist during your hospital stay. If you have any questions about your discharge medications or the care you received while you were in the hospital after you are discharged, you can call the unit and asked to speak with the hospitalist on call if the hospitalist that took care of you is not available. Once you are discharged, your primary care physician will handle any further medical issues. Please note that NO REFILLS for any discharge medications will be authorized once you are discharged, as it is imperative that you return to your primary care physician (or establish a relationship with a primary care physician if you do not have one) for your aftercare needs so that they can reassess your need for medications and monitor your lab values.    Today   CHIEF COMPLAINT:   Chief Complaint  Patient presents with  . Altered Mental Status    HISTORY OF PRESENT ILLNESS:  Megan Donaldson  is a 84 y.o. female brought in with altered  mental status.   VITAL SIGNS:  Blood pressure 124/69, pulse 74, temperature 98.2 F (36.8 C), resp. rate 18, height 5\' 3"  (1.6 m), weight 82.2 kg (181 lb 4.8 oz), SpO2 92 %.   PHYSICAL EXAMINATION:  GENERAL:  84 y.o.-year-old patient lying in the bed with no acute distress.  EYES: Pupils equal, round, reactive to light and accommodation. No scleral icterus. Extraocular muscles intact.  HEENT: Head atraumatic, normocephalic. Oropharynx and nasopharynx clear.  NECK:  Supple, no jugular venous distention. No thyroid enlargement, no tenderness.  LUNGS: Normal breath sounds bilaterally, no wheezing, rales,rhonchi or crepitation. No use of accessory muscles of respiration.  CARDIOVASCULAR: S1, S2 normal. No murmurs, rubs, or gallops.  ABDOMEN: Soft, non-tender, non-distended. Bowel sounds present. No organomegaly or mass.  EXTREMITIES: 2+ pedal edema.  No cyanosis, or clubbing.  NEUROLOGIC: Cranial nerves  II through XII are intact.  Patient able to straight leg raise PSYCHIATRIC: The patient is alert and answers some simple yes or no questions.  SKIN: No obvious rash, lesion, or ulcer.   DATA REVIEW:   CBC Recent Labs  Lab 09/18/17 0512  WBC 6.5  HGB 11.6*  HCT 34.4*  PLT 202    Chemistries  Recent Labs  Lab 09/17/17 1832 09/18/17 0512  NA 141 143  K 4.0 3.6  CL 107 109  CO2 26 27  GLUCOSE 103* 83  BUN 23 18  CREATININE 0.96 0.76  CALCIUM 9.1 9.1  MG 2.1  --   AST 22  --   ALT 13  --   ALKPHOS 60  --   BILITOT 0.4  --     Cardiac Enzymes Recent Labs  Lab 09/17/17 1832  TROPONINI <0.03    Microbiology Results  Results for orders placed or performed during the hospital encounter of 09/17/17  MRSA PCR Screening     Status: None   Collection Time: 09/17/17 10:59 PM  Result Value Ref Range Status   MRSA by PCR NEGATIVE NEGATIVE Final    Comment:        The GeneXpert MRSA Assay (FDA approved for NASAL specimens only), is one component of a comprehensive MRSA  colonization surveillance program. It is not intended to diagnose MRSA infection nor to guide or monitor treatment for MRSA infections. Performed at [facility], [address], [place], [place] [address]     RADIOLOGY:  Ct Head Wo Contrast  Result Date: 09/17/2017 CLINICAL DATA:  Altered mental status. EXAM: CT HEAD WITHOUT CONTRAST TECHNIQUE: Contiguous axial images were obtained from the base of the skull through the vertex without intravenous contrast. COMPARISON:  Brain MRI 08/07/2017.  Head CT 02/11/2010 FINDINGS: Brain: The the There is no evidence for acute hemorrhage, hydrocephalus, mass lesion, or abnormal extra-axial fluid collection. No definite CT evidence for acute infarction. Diffuse loss of parenchymal volume is consistent with atrophy. Patchy low attenuation in the deep hemispheric and periventricular white matter is nonspecific, but likely reflects chronic microvascular ischemic demyelination. Vascular: No hyperdense vessel or unexpected calcification. Skull: No evidence for fracture. No worrisome lytic or sclerotic lesion. Sinuses/Orbits: The visualized paranasal sinuses and mastoid air cells are clear. Visualized portions of the globes and intraorbital fat are unremarkable. Other: None. IMPRESSION: 1. No acute intracranial abnormality. 2. Atrophy with chronic small vessel white matter ischemic disease. Electronically Signed   By: Kennith Center M.D.   On: 09/17/2017 19:13   Dg Chest Port 1 View  Result Date: 09/18/2017 CLINICAL DATA:  Heart failure EXAM: PORTABLE CHEST 1 VIEW COMPARISON:  6/22/9 FINDINGS: Cardiac shadow is stable. Thoracic aorta is tortuous. Lungs are well aerated bilaterally without focal infiltrate or sizable effusion. Upper abdomen demonstrates a rim calcification in the left upper quadrant likely related to splenic artery aneurysm. This is stable from the prior exams. IMPRESSION: No acute abnormality identified. Electronically Signed   By: Alcide Clever M.D.   On: 09/18/2017 07:55     Management plans discussed with the patient, family and they are in agreement.  CODE STATUS:     Code Status Orders  (From admission, onward)        Start     Ordered   09/17/17 2301  Do not attempt resuscitation (DNR)  Continuous    Question Answer Comment  In the event of cardiac or respiratory ARREST Do not call a "code blue"  In the event of cardiac or respiratory ARREST Do not perform Intubation, CPR, defibrillation or ACLS   In the event of cardiac or respiratory ARREST Use medication by any route, position, wound care, and other measures to relive pain and suffering. May use oxygen, suction and manual treatment of airway obstruction as needed for comfort.      09/17/17 2300    Code Status History    Date Active Date Inactive Code Status Order ID Comments User Context   08/07/2017 1553 08/08/2017 1938 DNR [id number]  Milagros LollSudini, Srikar, MD ED   08/07/2017 1528 08/07/2017 1553 DNR [id number]  Willy Eddyobinson, Patrick, MD ED    Advance Directive Documentation     Most Recent Value  Type of Advance Directive  Out of facility DNR (pink MOST or yellow form)  Pre-existing out of facility DNR order (yellow form or pink MOST form)  Physician notified to receive inpatient order  "MOST" Form in Place?  -      TOTAL TIME TAKING CARE OF THIS PATIENT: 35 minutes.    Alford Highlandichard Shaana Acocella M.D on 09/19/2017 at 3:14 PM  Between 7am to 6pm - Pager - [phone]  After 6pm go to [email or web address] - password [email or web address]  [employer] Office  [phone]  CC: Primary care physician; Dorothey BasemanBronstein, David, MD

## 2017-09-19 NOTE — Clinical Social Work Note (Signed)
The [facility] family transport. The family and the facility are aware and in agreement with this plan. The CSW has delivered the discharge paperwork to the family [facility], and the facility is aware that the packet contains the FL 2, prescriptions, DNR, and discharge summary. The CSW is signing off. Please consult should needs change.  Argentina PonderKaren Martha Ansley Stanwood, MSW, Theresia MajorsLCSWA [phone]

## 2017-09-19 NOTE — Care Management Note (Addendum)
Case Management Note  Patient Details  Name: Megan PillowRuth M Donaldson MRN: [id number] Date of Birth: 11/30/1927  Subjective/Objective: Patient to be discharged per MD order. Orders in place for home health services. Per preference patient will be set up with [facility] home health agency. Referral placed with Megan Maxwellheryl who accepts the patient for PT and OT services. [facility]. Uses a rollator at baseline. NO other DME needs. Daughter in room will provide transport.  Megan Donaldson Megan Zellman RN BSN RNCM [phone]                      Action/Plan:   Expected Discharge Date:  09/19/17               Expected Discharge Plan:  Home w Home Health Services  In-House Referral:     Discharge planning Services  CM Consult  Post Acute Care Choice:  Home Health Choice offered to:  Patient, Adult Children  DME Arranged:    DME Agency:     HH Arranged:  RN, PT HH Agency:  [facility]  Status of Service:  In process, will continue to follow  If discussed at Long Length of Stay Meetings, dates discussed:    Additional Comments:  Megan Hynson A Megan Lucien, RN 09/19/2017, 3:10 PM

## 2017-09-19 NOTE — Clinical Social Work Note (Signed)
Clinical Social Work Assessment  Patient Details  Name: Megan Donaldson MRN: [id number] Date of Birth: [birth year]  Date of referral:  09/19/17               Reason for consult:  Facility Placement                Permission sought to share information with:  Chartered certified accountant granted to share information::  Yes, Verbal Permission Granted  Name::        Agency::  [facility]  Relationship::     Contact Information:     Housing/Transportation Living arrangements for the past 2 months:  [place] of Information:  Patient, Medical Team, Adult Children Patient Interpreter Needed:  None Criminal Activity/Legal Involvement Pertinent to Current Situation/Hospitalization:  No - Comment as needed Significant Relationships:  Adult Children, Community Support Lives with:  Facility Resident Do you feel safe going back to the place where you live?  Yes Need for family participation in patient care:  Yes (Comment)(patient has some confusion)  Care giving concerns: Patient admitted from [place]   Social Worker assessment / plan: The CSW met at bedside with the patient, her daughter Pamala Hurry, and her son-in-law. The patient was alert, and she answered appropriately to orientation questions. The patient has been a resident of [facility] for about 6 weeks just prior to her husband passing away. The patient uses a Rollator at baseline and has dementia which has been exacerbated recently, possibly due to changes in home environment and supports with the passing of her husband. The patient's daughter shared that they would prefer that the patient return to the facility with home health if needed. They would not like to pursue SNF unless that is the only option. The family will transport at discharge.  The CSW will follow to facilitate discharge when able. PT is pending.  Employment status:  Retired Forensic scientist:  Medicare PT  Recommendations:  Not assessed at this time Information / Referral to community resources:     Patient/Family's Response to care:  The patient and her family thanked the CSW.  Patient/Family's Understanding of and Emotional Response to Diagnosis, Current Treatment, and Prognosis:  The patient and her family understand the discharge plan and are in agreement.  Emotional Assessment Appearance:  Appears younger than stated age Attitude/Demeanor/Rapport:  Engaged, Gracious Affect (typically observed):  Pleasant, Appropriate Orientation:  Oriented to Self, Oriented to Place, Oriented to Situation, Oriented to  Time Alcohol / Substance use:  Never Used Psych involvement (Current and /or in the community):  No (Comment)  Discharge Needs  Concerns to be addressed:  Discharge Planning Concerns, Care Coordination Readmission within the last 30 days:  No Current discharge risk:  Cognitively Impaired, Chronically ill Barriers to Discharge:  Continued Medical Work up   [employer], LCSW 09/19/2017, 2:03 PM

## 2018-02-01 ENCOUNTER — Other Ambulatory Visit: Payer: Self-pay

## 2018-02-01 ENCOUNTER — Inpatient Hospital Stay
Admission: EM | Admit: 2018-02-01 | Discharge: 2018-02-03 | DRG: 382 | Disposition: A | Discharge status: Home / Self Care | Payer: Medicare Other | Attending: Internal Medicine | Admitting: Internal Medicine

## 2018-02-01 DIAGNOSIS — K2211 Ulcer of esophagus with bleeding: Principal | ICD-10-CM | POA: Diagnosis present

## 2018-02-01 DIAGNOSIS — K219 Gastro-esophageal reflux disease without esophagitis: Secondary | ICD-10-CM | POA: Diagnosis present

## 2018-02-01 DIAGNOSIS — I509 Heart failure, unspecified: Secondary | ICD-10-CM | POA: Diagnosis present

## 2018-02-01 DIAGNOSIS — M810 Age-related osteoporosis without current pathological fracture: Secondary | ICD-10-CM | POA: Diagnosis present

## 2018-02-01 DIAGNOSIS — Z8711 Personal history of peptic ulcer disease: Secondary | ICD-10-CM

## 2018-02-01 DIAGNOSIS — Z86718 Personal history of other venous thrombosis and embolism: Secondary | ICD-10-CM | POA: Diagnosis not present

## 2018-02-01 DIAGNOSIS — Z86711 Personal history of pulmonary embolism: Secondary | ICD-10-CM | POA: Diagnosis not present

## 2018-02-01 DIAGNOSIS — F0390 Unspecified dementia without behavioral disturbance: Secondary | ICD-10-CM | POA: Diagnosis present

## 2018-02-01 DIAGNOSIS — Z91040 Latex allergy status: Secondary | ICD-10-CM | POA: Diagnosis not present

## 2018-02-01 DIAGNOSIS — K254 Chronic or unspecified gastric ulcer with hemorrhage: Secondary | ICD-10-CM | POA: Diagnosis present

## 2018-02-01 DIAGNOSIS — F329 Major depressive disorder, single episode, unspecified: Secondary | ICD-10-CM | POA: Diagnosis present

## 2018-02-01 DIAGNOSIS — E039 Hypothyroidism, unspecified: Secondary | ICD-10-CM | POA: Diagnosis present

## 2018-02-01 DIAGNOSIS — Z791 Long term (current) use of non-steroidal anti-inflammatories (NSAID): Secondary | ICD-10-CM | POA: Diagnosis not present

## 2018-02-01 DIAGNOSIS — D509 Iron deficiency anemia, unspecified: Secondary | ICD-10-CM | POA: Diagnosis present

## 2018-02-01 DIAGNOSIS — Z881 Allergy status to other antibiotic agents status: Secondary | ICD-10-CM

## 2018-02-01 DIAGNOSIS — Z79899 Other long term (current) drug therapy: Secondary | ICD-10-CM

## 2018-02-01 DIAGNOSIS — Z7982 Long term (current) use of aspirin: Secondary | ICD-10-CM | POA: Diagnosis not present

## 2018-02-01 DIAGNOSIS — K5710 Diverticulosis of small intestine without perforation or abscess without bleeding: Secondary | ICD-10-CM | POA: Diagnosis present

## 2018-02-01 DIAGNOSIS — Z66 Do not resuscitate: Secondary | ICD-10-CM | POA: Diagnosis present

## 2018-02-01 DIAGNOSIS — Z888 Allergy status to other drugs, medicaments and biological substances status: Secondary | ICD-10-CM | POA: Diagnosis not present

## 2018-02-01 DIAGNOSIS — I110 Hypertensive heart disease with heart failure: Secondary | ICD-10-CM | POA: Diagnosis present

## 2018-02-01 DIAGNOSIS — K922 Gastrointestinal hemorrhage, unspecified: Secondary | ICD-10-CM | POA: Diagnosis present

## 2018-02-01 DIAGNOSIS — E785 Hyperlipidemia, unspecified: Secondary | ICD-10-CM | POA: Diagnosis present

## 2018-02-01 DIAGNOSIS — Z96641 Presence of right artificial hip joint: Secondary | ICD-10-CM | POA: Diagnosis present

## 2018-02-01 DIAGNOSIS — Z880 Allergy status to penicillin: Secondary | ICD-10-CM | POA: Diagnosis not present

## 2018-02-01 LAB — COMPREHENSIVE METABOLIC PANEL
ALT: 15 U/L (ref 0–44)
AST: 22 U/L (ref 15–41)
Albumin: 3.7 g/dL (ref 3.5–5.0)
Alkaline Phosphatase: 62 U/L (ref 38–126)
Anion gap: 7 (ref 5–15)
BUN: 32 mg/dL — AB (ref 8–23)
CHLORIDE: 107 mmol/L (ref 98–111)
CO2: 26 mmol/L (ref 22–32)
Calcium: 9 mg/dL (ref 8.9–10.3)
Creatinine, Ser: 1.16 mg/dL — ABNORMAL HIGH (ref 0.44–1.00)
GFR calc Af Amer: 49 mL/min — ABNORMAL LOW (ref >60)
GFR, EST NON AFRICAN AMERICAN: 42 mL/min — AB (ref >60)
Glucose, Bld: 172 mg/dL — ABNORMAL HIGH (ref 70–99)
Potassium: 4.1 mmol/L (ref 3.5–5.1)
SODIUM: 140 mmol/L (ref 135–145)
Total Bilirubin: 0.4 mg/dL (ref 0.3–1.2)
Total Protein: 6.3 g/dL — ABNORMAL LOW (ref 6.5–8.1)

## 2018-02-01 LAB — CBC
HEMATOCRIT: 34.1 % — AB (ref 36.0–46.0)
Hemoglobin: 10.6 g/dL — ABNORMAL LOW (ref 12.0–15.0)
MCH: 27.2 pg (ref 26.0–34.0)
MCHC: 31.1 g/dL (ref 30.0–36.0)
MCV: 87.7 fL (ref 80.0–100.0)
Platelets: 186 10*3/uL (ref 150–400)
RBC: 3.89 MIL/uL (ref 3.87–5.11)
RDW: 15.6 % — ABNORMAL HIGH (ref 11.5–15.5)
WBC: 7.8 10*3/uL (ref 4.0–10.5)
nRBC: 0 % (ref 0.0–0.2)

## 2018-02-01 LAB — TYPE AND SCREEN
ABO/RH(D): A POS
ANTIBODY SCREEN: NEGATIVE

## 2018-02-01 LAB — MRSA PCR SCREENING: MRSA by PCR: NEGATIVE

## 2018-02-01 LAB — PROTIME-INR
INR: 0.86
Prothrombin Time: 11.7 seconds (ref 11.4–15.2)

## 2018-02-01 MED ORDER — ONDANSETRON HCL 4 MG PO TABS: 4.0000 mg | ORAL_TABLET | Freq: Four times a day (QID) | ORAL | Status: DC | PRN

## 2018-02-01 MED ORDER — SODIUM CHLORIDE 0.9 % IV SOLN
80.0000 mg | Freq: Once | INTRAVENOUS | Status: AC
  Administered 2018-02-01: 80 mg via INTRAVENOUS
  Filled 2018-02-01: qty 80

## 2018-02-01 MED ORDER — SODIUM CHLORIDE 0.9 % IV SOLN
8.0000 mg/h | INTRAVENOUS | Status: DC
  Administered 2018-02-01 – 2018-02-02 (×3): 8 mg/h via INTRAVENOUS
  Filled 2018-02-01 (×3): qty 80

## 2018-02-01 MED ORDER — SODIUM CHLORIDE 0.9 % IV SOLN
8.0000 mg/h | INTRAVENOUS | Status: DC
  Filled 2018-02-01: qty 80

## 2018-02-01 MED ORDER — ACETAMINOPHEN 325 MG PO TABS
650.0000 mg | ORAL_TABLET | Freq: Four times a day (QID) | ORAL | Status: DC | PRN
  Administered 2018-02-03: 650 mg via ORAL
  Filled 2018-02-01: qty 2

## 2018-02-01 MED ORDER — ONDANSETRON HCL 4 MG/2ML IJ SOLN
4.0000 mg | Freq: Four times a day (QID) | INTRAMUSCULAR | Status: DC | PRN
  Administered 2018-02-02: 4 mg via INTRAVENOUS
  Filled 2018-02-01: qty 2

## 2018-02-01 MED ORDER — SODIUM CHLORIDE 0.9 % IV SOLN
INTRAVENOUS | Status: DC
  Administered 2018-02-01 – 2018-02-03 (×4): via INTRAVENOUS

## 2018-02-01 MED ORDER — ACETAMINOPHEN 650 MG RE SUPP: 650.0000 mg | Freq: Four times a day (QID) | RECTAL | Status: DC | PRN

## 2018-02-01 NOTE — ED Notes (Signed)
Report given to Kelley RN

## 2018-02-01 NOTE — ED Notes (Signed)
Family at bedside. 

## 2018-02-01 NOTE — Progress Notes (Signed)
Advanced care plan.  Purpose of the Encounter: CODE STATUS  Parties in Attendance: Patient  Patient's Decision Capacity: Good  Subjective/Patient's story: Presented to emergency room for vomiting of blood   Objective/Medical story Patient has acute gastrointestinal bleeding Needs close monitoring of hemoglobin hematocrit and GI evaluation for possible endoscopy   Goals of care determination:  Advance care directives goals of care and treatment plan discussed with the patient Patient does not want any CPR, intubation ventilator if the need arises   CODE STATUS: DNR   Time spent discussing advanced care planning: 16 minutes

## 2018-02-01 NOTE — H&P (Signed)
[facility] [place] [facility]   PATIENT NAME: Megan ShihRuth Navarette    MR#:  [id number]  DATE OF BIRTH:  14-Feb-1928  DATE OF ADMISSION:  02/01/2018  PRIMARY CARE PHYSICIAN: Dorothey BasemanBronstein, David, MD   REQUESTING/REFERRING PHYSICIAN:   CHIEF COMPLAINT:   Chief Complaint  Patient presents with  . GI Bleeding    HISTORY OF PRESENT ILLNESS: Megan Donaldson  is a 84 y.o. female with a known history of peptic ulcer disease, dementia, congestive heart failure chronic, GERD, hyperlipidemia, hypertension, osteoporosis, pulmonary medicine in the past he is a resident of [facility].  She vomited blood this morning.  She also had an episode of rectal bleed.  Patient presented to emergency room hemoglobin is around 10 she was started on IV fluids and Protonix.  Gastroenterology was consulted.  Hospitalist service was consulted.  PAST MEDICAL HISTORY:   Past Medical History:  Diagnosis Date  . CHF (congestive heart failure) (HCC)   . Dementia (HCC)   . GERD (gastroesophageal reflux disease)   . Hyperlipidemia   . Hypertension   . Osteoporosis   . Personal history of other venous thrombosis and embolism   . Personal history of PE (pulmonary embolism)     PAST SURGICAL HISTORY:  Past Surgical History:  Procedure Laterality Date  . HIP CLOSED REDUCTION Right 12/21/2014   Procedure: CLOSED REDUCTION HIP;  Surgeon: Deeann SaintHoward Miller, MD;  Location: [facility] ORS;  Service: Orthopedics;  Laterality: Right;  . TOTAL HIP ARTHROPLASTY Right    x3    SOCIAL HISTORY:  Social History   Tobacco Use  . Smoking status: Never Smoker  . Smokeless tobacco: Never Used  Substance Use Topics  . Alcohol use: No    FAMILY HISTORY:  Family History  Problem Relation Age of Onset  . CAD Father     DRUG ALLERGIES:  Allergies  Allergen Reactions  . Amoxicillin     Has patient had a PCN reaction causing immediate rash, facial/tongue/throat swelling, SOB or  lightheadedness with hypotension: Unknown Has patient had a PCN reaction causing severe rash involving mucus membranes or skin necrosis: Unknown Has patient had a PCN reaction that required hospitalization: Unknown Has patient had a PCN reaction occurring within the last 10 years: Unknown If all of the above answers are "NO", then may proceed with Cephalosporin use.   . Latex   . Lisinopril     REVIEW OF SYSTEMS:   CONSTITUTIONAL: No fever, has fatigue and weakness.  EYES: No blurred or double vision.  EARS, NOSE, AND THROAT: No tinnitus or ear pain.  RESPIRATORY: No cough, shortness of breath, wheezing or hemoptysis.  CARDIOVASCULAR: No chest pain, orthopnea, edema.  GASTROINTESTINAL: No nausea, diarrhea, abdominal pain.  Vomited blood Had rectal bleed GENITOURINARY: No dysuria, hematuria.  ENDOCRINE: No polyuria, nocturia,  HEMATOLOGY: No anemia, easy bruising or bleeding SKIN: No rash or lesion. MUSCULOSKELETAL: No joint pain or arthritis.   NEUROLOGIC: No tingling, numbness, weakness.  PSYCHIATRY: No anxiety or depression.   MEDICATIONS AT HOME:  Prior to Admission medications   Medication Sig Start Date End Date Taking? Authorizing Provider  acetaminophen (TYLENOL) 500 MG tablet Take 1,000 mg by mouth 2 (two) times daily.    Yes [provider]  aspirin EC 81 MG tablet Take 1 tablet by mouth daily. 08/27/17 08/27/18 Yes [provider]  Calcium Carbonate-Vitamin D3 (CALCIUM 600-D) 600-400 MG-UNIT TABS Take 1 tablet by mouth 2 (two) times daily.   Yes [provider]  escitalopram (LEXAPRO) 10 MG tablet Take 10 mg by mouth daily. 07/21/17  Yes [provider]  lamoTRIgine (LAMICTAL) 25 MG tablet Take 50 mg by mouth 2 (two) times daily. 01/17/18 01/17/19 Yes [provider]  levothyroxine (SYNTHROID, LEVOTHROID) 75 MCG tablet Take 75 mcg by mouth daily.   Yes [provider]  metoprolol succinate (TOPROL-XL) 25 MG 24 hr tablet  Take 1 tablet (25 mg total) by mouth daily. 09/20/17  Yes Wieting, Richard, MD  pantoprazole (PROTONIX) 40 MG tablet Take 40 mg by mouth daily. 02/03/17  Yes [provider]  Polyethylene Glycol 3350 (PEG 3350) POWD Take 17 g by mouth daily as needed. 08/17/17  Yes [provider]  risedronate (ACTONEL) 35 MG tablet Take 35 mg by mouth every 7 (seven) days. Take with full glass of water. Do not lie down for the next 30 minutes. 07/15/17  Yes [provider]  simvastatin (ZOCOR) 40 MG tablet Take 40 mg by mouth at bedtime. 05/04/17  Yes [provider]  traZODone (DESYREL) 50 MG tablet Take 1 tablet (50 mg total) by mouth at bedtime. 09/19/17  Yes Wieting, Richard, MD      PHYSICAL EXAMINATION:   VITAL SIGNS: Blood pressure 94/60, pulse 77, temperature 98 F (36.7 C), temperature source Oral, resp. rate 15, height 5\' 4"  (1.626 m), weight 88.5 kg, SpO2 94 %.  GENERAL:  84 y.o.-year-old patient lying in the bed with no acute distress.  EYES: Pupils equal, round, reactive to light and accommodation. No scleral icterus. Extraocular muscles intact.  HEENT: Head atraumatic, normocephalic. Oropharynx dry and nasopharynx clear.  NECK:  Supple, no jugular venous distention. No thyroid enlargement, no tenderness.  LUNGS: Normal breath sounds bilaterally, no wheezing, rales,rhonchi or crepitation. No use of accessory muscles of respiration.  CARDIOVASCULAR: S1, S2 normal. No murmurs, rubs, or gallops.  ABDOMEN: Soft, nontender, nondistended. Bowel sounds present. No organomegaly or mass.  EXTREMITIES: No pedal edema, cyanosis, or clubbing.  NEUROLOGIC: Cranial nerves II through XII are intact. Muscle strength 5/5 in all extremities. Sensation intact. Gait not checked.  PSYCHIATRIC: The patient is alert and oriented x 3.  SKIN: No obvious rash, lesion, or ulcer.   LABORATORY PANEL:   CBC Recent Labs  Lab 02/01/18 1851  WBC 7.8  HGB 10.6*  HCT 34.1*  PLT 186  MCV  87.7  MCH 27.2  MCHC 31.1  RDW 15.6*   ------------------------------------------------------------------------------------------------------------------  Chemistries  Recent Labs  Lab 02/01/18 1851  NA 140  K 4.1  CL 107  CO2 26  GLUCOSE 172*  BUN 32*  CREATININE 1.16*  CALCIUM 9.0  AST 22  ALT 15  ALKPHOS 62  BILITOT 0.4   ------------------------------------------------------------------------------------------------------------------ estimated creatinine clearance is 36.1 mL/min (A) (by C-G formula based on SCr of 1.16 mg/dL (H)). ------------------------------------------------------------------------------------------------------------------ No results for input(s): TSH, T4TOTAL, T3FREE, THYROIDAB in the last 72 hours.  Invalid input(s): FREET3   Coagulation profile No results for input(s): INR, PROTIME in the last 168 hours. ------------------------------------------------------------------------------------------------------------------- No results for input(s): DDIMER in the last 72 hours. -------------------------------------------------------------------------------------------------------------------  Cardiac Enzymes No results for input(s): CKMB, TROPONINI, MYOGLOBIN in the last 168 hours.  Invalid input(s): CK ------------------------------------------------------------------------------------------------------------------ Invalid input(s): POCBNP  ---------------------------------------------------------------------------------------------------------------  Urinalysis    Component Value Date/Time   COLORURINE YELLOW (A) 09/17/2017 1832   APPEARANCEUR CLEAR (A) 09/17/2017 1832   APPEARANCEUR Hazy 10/25/2011 1219   LABSPEC 1.021 09/17/2017 1832   LABSPEC 1.023 10/25/2011 1219   PHURINE 5.0 09/17/2017 1832  GLUCOSEU NEGATIVE 09/17/2017 1832   GLUCOSEU Negative 10/25/2011 1219   HGBUR NEGATIVE 09/17/2017 1832   BILIRUBINUR NEGATIVE 09/17/2017  1832   BILIRUBINUR Negative 10/25/2011 1219   KETONESUR NEGATIVE 09/17/2017 1832   PROTEINUR NEGATIVE 09/17/2017 1832   NITRITE NEGATIVE 09/17/2017 1832   LEUKOCYTESUR NEGATIVE 09/17/2017 1832   LEUKOCYTESUR 1+ 10/25/2011 1219     RADIOLOGY: No results found.  EKG: Orders placed or performed during the hospital encounter of 02/01/18  . EKG 12-Lead  . EKG 12-Lead    IMPRESSION AND PLAN:  84 year old elderly female patient with history of peptic ulcer disease, hypertension, hyperlipidemia, osteoporosis, per #presented to the emergency room for vomiting of blood  -Acute gastrointestinal bleeding Admit patient to medical floor Start patient on IV Protonix drip IV fluids Serial hemoglobin hematocrit monitoring Gastroenterology consultation  -Anemia Monitor hemoglobin hematocrit If hemoglobin drops less than 7 PRBC transfusion  -Hypertension Currently blood pressure is soft we will hold off blood pressure medications  -DVT prophylaxis sequential compression device to lower extremities  All the records are reviewed and case discussed with ED provider. Management plans discussed with the patient, family and they are in agreement.  CODE STATUS:DNR Code Status History    Date Active Date Inactive Code Status Order ID Comments User Context   09/17/2017 2300 09/19/2017 1904 DNR [id number]  Cammy Copa, MD Inpatient   08/07/2017 1553 08/08/2017 1938 DNR [id number]  Milagros Loll, MD ED   08/07/2017 1528 08/07/2017 1553 DNR [id number]  Willy Eddy, MD ED    Questions for Most Recent Historical Code Status (Order [id number])    Question Answer Comment   In the event of cardiac or respiratory ARREST Do not call a "code blue"    In the event of cardiac or respiratory ARREST Do not perform Intubation, CPR, defibrillation or ACLS    In the event of cardiac or respiratory ARREST Use medication by any route, position, wound care, and other measures to relive pain and suffering. May use  oxygen, suction and manual treatment of airway obstruction as needed for comfort.        TOTAL TIME TAKING CARE OF THIS PATIENT: 54 minutes.    Ihor Austin M.D on 02/01/2018 at 7:57 PM  Between 7am to 6pm - Pager - [phone]  After 6pm go to [email or web address] - password [email or web address] [facility]  [facility]  Office  [phone]  CC: Primary care physician; Dorothey Baseman, MD

## 2018-02-01 NOTE — ED Notes (Signed)
Kimberly RN, aware of bed assigned  

## 2018-02-01 NOTE — ED Notes (Signed)
Admitting MD in with pt    

## 2018-02-01 NOTE — ED Triage Notes (Addendum)
[facility] for upper and lower GI bleed. Vomiting bright red blood at facility and with EMS. Was given 4mg  zofran and started NS bolus. Arrives with IV in place. Appears pale and weak. BP with EMS in 140's and decreased to 110's while in route.

## 2018-02-01 NOTE — ED Provider Notes (Signed)
[facility] Emergency Department Provider Note  Time seen: 7:14 PM  I have reviewed the triage vital signs and the nursing notes.   HISTORY  Chief Complaint GI Bleeding    HPI Megan Donaldson is a 84 y.o. female with a past medical history of dementia, gastric reflux, hypertension, hyperlipidemia, presents to the emergency department for GI bleeding.  According EMS report patient lives in [facility].  Per report patient had an episode of bright red bloody vomitus at the facility.  EMS states patient had an additional episode of bloody vomit with them in route to the hospital.  They placed an IV patient was given Zofran.  Here the patient appears well she denies any complaints of abdominal pain or chest pain.  Does not believe she is ever had bloody vomit before although she does have a history of dementia.   Past Medical History:  Diagnosis Date  . CHF (congestive heart failure) (HCC)   . Dementia (HCC)   . GERD (gastroesophageal reflux disease)   . Hyperlipidemia   . Hypertension   . Osteoporosis   . Personal history of other venous thrombosis and embolism   . Personal history of PE (pulmonary embolism)     Patient Active Problem List   Diagnosis Date Noted  . Altered mental status 09/17/2017  . Seizure (HCC) 08/07/2017    Past Surgical History:  Procedure Laterality Date  . HIP CLOSED REDUCTION Right 12/21/2014   Procedure: CLOSED REDUCTION HIP;  Surgeon: Deeann SaintHoward Miller, MD;  Location: [facility] ORS;  Service: Orthopedics;  Laterality: Right;  . TOTAL HIP ARTHROPLASTY Right    x3    Prior to Admission medications   Medication Sig Start Date End Date Taking? Authorizing Provider  acetaminophen (TYLENOL) 500 MG tablet Take 1,000 mg by mouth 2 (two) times daily.    Yes [provider]  aspirin EC 81 MG tablet Take 1 tablet by mouth daily. 08/27/17 08/27/18 Yes [provider]  Calcium Carbonate-Vitamin D3 (CALCIUM 600-D)  600-400 MG-UNIT TABS Take 1 tablet by mouth 2 (two) times daily.   Yes [provider]  escitalopram (LEXAPRO) 10 MG tablet Take 10 mg by mouth daily. 07/21/17  Yes [provider]  lamoTRIgine (LAMICTAL) 25 MG tablet Take 50 mg by mouth 2 (two) times daily. 01/17/18 01/17/19 Yes [provider]  levothyroxine (SYNTHROID, LEVOTHROID) 75 MCG tablet Take 75 mcg by mouth daily.   Yes [provider]  metoprolol succinate (TOPROL-XL) 25 MG 24 hr tablet Take 1 tablet (25 mg total) by mouth daily. 09/20/17  Yes Wieting, Richard, MD  pantoprazole (PROTONIX) 40 MG tablet Take 40 mg by mouth daily. 02/03/17  Yes [provider]  Polyethylene Glycol 3350 (PEG 3350) POWD Take 17 g by mouth daily as needed. 08/17/17  Yes [provider]  risedronate (ACTONEL) 35 MG tablet Take 35 mg by mouth every 7 (seven) days. Take with full glass of water. Do not lie down for the next 30 minutes. 07/15/17  Yes [provider]  simvastatin (ZOCOR) 40 MG tablet Take 40 mg by mouth at bedtime. 05/04/17  Yes [provider]  traZODone (DESYREL) 50 MG tablet Take 1 tablet (50 mg total) by mouth at bedtime. 09/19/17  Yes Alford HighlandWieting, Richard, MD    Allergies  Allergen Reactions  . Amoxicillin     Has patient had a PCN reaction causing immediate rash, facial/tongue/throat swelling, SOB or lightheadedness with hypotension: Unknown Has patient had a PCN  reaction causing severe rash involving mucus membranes or skin necrosis: Unknown Has patient had a PCN reaction that required hospitalization: Unknown Has patient had a PCN reaction occurring within the last 10 years: Unknown If all of the above answers are "NO", then may proceed with Cephalosporin use.   . Latex   . Lisinopril     Family History  Problem Relation Age of Onset  . CAD Father     Social History Social History   Tobacco Use  . Smoking status: Never Smoker  . Smokeless tobacco: Never Used   Substance Use Topics  . Alcohol use: No  . Drug use: No    Review of Systems Unable to obtain an accurate/adequate review of systems secondary to dementia.  ____________________________________________   PHYSICAL EXAM:  VITAL SIGNS: ED Triage Vitals  Enc Vitals Group     BP 02/01/18 1851 (!) 113/54     Pulse Rate 02/01/18 1849 73     Resp 02/01/18 1849 17     Temp 02/01/18 1851 98 F (36.7 C)     Temp Source 02/01/18 1851 Oral     SpO2 02/01/18 1849 93 %     Weight 02/01/18 1850 195 lb (88.5 kg)     Height 02/01/18 1850 5\' 4"  (1.626 m)     Head Circumference --      Peak Flow --      Pain Score 02/01/18 1849 0     Pain Loc --      Pain Edu? --      Excl. in GC? --    Constitutional: Alert and oriented. Well appearing and in no distress. Eyes: Normal exam ENT   Head: Normocephalic and atraumatic.   Mouth/Throat: Mucous membranes are moist. Cardiovascular: Normal rate, regular rhythm.  Respiratory: Normal respiratory effort without tachypnea nor retractions. Breath sounds are clear  Gastrointestinal: Soft and nontender. No distention.   Musculoskeletal: Nontender with normal range of motion in all extremities.  Neurologic:  Normal speech and language. No gross focal neurologic deficits Skin:  Skin is warm, dry and intact.  Psychiatric: Mood and affect are normal.   ____________________________________________    EKG  EKG viewed and interpreted by myself shows a sinus rhythm at 76 bpm with a narrow QRS, normal axis, normal intervals, nonspecific but no concerning ST changes.   INITIAL IMPRESSION / ASSESSMENT AND PLAN / ED COURSE  Pertinent labs & imaging results that were available during my care of the patient were reviewed by me and considered in my medical decision making (see chart for details).  Patient presents to the emergency department for GI bleeding.  Per EMS patient had an episode of bright red vomitus with EMS as well as 1 episode at the  nursing facility.  The patient appears well, nontender abdomen.  We will check labs continue with IV hydration I will perform a rectal examination we will continue to closely monitor.  Patient agreeable to plan of care.  Rectal exam shows dark brown stool however it is mildly guaiac positive.  Patient's labs are resulted largely at baseline hemoglobin is dropped one-point.  We will discuss with GI medicine for further recommendations.  Patient will likely require admission to the hospital service for continued work-up.  I discussed with GI medicine.  They will consult.  Patient will be admitted to the hospitalist service.   CRITICAL CARE Performed by: Minna Antis   Total critical care time: 30 minutes  Critical care time was exclusive of separately billable  procedures and treating other patients.  Critical care was necessary to treat or prevent imminent or life-threatening deterioration.  Critical care was time spent personally by me on the following activities: development of treatment plan with patient and/or surrogate as well as nursing, discussions with consultants, evaluation of patient's response to treatment, examination of patient, obtaining history from patient or surrogate, ordering and performing treatments and interventions, ordering and review of laboratory studies, ordering and review of radiographic studies, pulse oximetry and re-evaluation of patient's condition.   ____________________________________________   FINAL CLINICAL IMPRESSION(S) / ED DIAGNOSES  Upper GI bleed   Minna Antis, MD 02/01/18 2329

## 2018-02-01 NOTE — ED Notes (Signed)
This RN in with MD to perform rectal exam. Pt is positive for blood. Pt to be admitted to hospital and GI consult with possible endoscopy. Pt asked that her daughter Britta MccreedyBarbara be called and updated.

## 2018-02-02 ENCOUNTER — Encounter
Admission: EM | Disposition: A | Discharge status: Home / Self Care | Payer: Self-pay | Source: Home / Self Care | Attending: Internal Medicine

## 2018-02-02 ENCOUNTER — Inpatient Hospital Stay: Payer: Medicare Other | Admitting: Anesthesiology

## 2018-02-02 ENCOUNTER — Encounter: Payer: Self-pay | Admitting: Anesthesiology

## 2018-02-02 HISTORY — PX: ESOPHAGOGASTRODUODENOSCOPY: SHX5428

## 2018-02-02 LAB — HEMOGLOBIN AND HEMATOCRIT, BLOOD
HCT: 31 % — ABNORMAL LOW (ref 36.0–46.0)
HCT: 31.8 % — ABNORMAL LOW (ref 36.0–46.0)
HCT: 28.9 % — ABNORMAL LOW (ref 36.0–46.0)
HEMATOCRIT: 29.3 % — AB (ref 36.0–46.0)
Hemoglobin: 9.5 g/dL — ABNORMAL LOW (ref 12.0–15.0)
Hemoglobin: 9.6 g/dL — ABNORMAL LOW (ref 12.0–15.0)
Hemoglobin: 9.8 g/dL — ABNORMAL LOW (ref 12.0–15.0)
Hemoglobin: 9.2 g/dL — ABNORMAL LOW (ref 12.0–15.0)

## 2018-02-02 LAB — BASIC METABOLIC PANEL
Anion gap: 6 (ref 5–15)
BUN: 39 mg/dL — ABNORMAL HIGH (ref 8–23)
CO2: 22 mmol/L (ref 22–32)
CREATININE: 1.1 mg/dL — AB (ref 0.44–1.00)
Calcium: 8.3 mg/dL — ABNORMAL LOW (ref 8.9–10.3)
Chloride: 113 mmol/L — ABNORMAL HIGH (ref 98–111)
GFR calc Af Amer: 52 mL/min — ABNORMAL LOW (ref >60)
GFR calc non Af Amer: 45 mL/min — ABNORMAL LOW (ref >60)
Glucose, Bld: 95 mg/dL (ref 70–99)
Potassium: 4.3 mmol/L (ref 3.5–5.1)
Sodium: 141 mmol/L (ref 135–145)

## 2018-02-02 LAB — GLUCOSE, CAPILLARY
Glucose-Capillary: 92 mg/dL (ref 70–99)
Glucose-Capillary: 104 mg/dL — ABNORMAL HIGH (ref 70–99)

## 2018-02-02 SURGERY — EGD (ESOPHAGOGASTRODUODENOSCOPY)
Anesthesia: General

## 2018-02-02 MED ORDER — LEVOTHYROXINE SODIUM 50 MCG PO TABS
75.0000 ug | ORAL_TABLET | Freq: Every day | ORAL | Status: DC
  Administered 2018-02-03: 75 ug via ORAL
  Filled 2018-02-02: qty 2

## 2018-02-02 MED ORDER — LAMOTRIGINE 25 MG PO TABS
50.0000 mg | ORAL_TABLET | Freq: Two times a day (BID) | ORAL | Status: DC
  Administered 2018-02-03: 50 mg via ORAL
  Filled 2018-02-02: qty 2

## 2018-02-02 MED ORDER — PROPOFOL 10 MG/ML IV BOLUS
INTRAVENOUS | Status: AC
  Filled 2018-02-02: qty 20

## 2018-02-02 MED ORDER — LIDOCAINE HCL (CARDIAC) PF 100 MG/5ML IV SOSY
PREFILLED_SYRINGE | INTRAVENOUS | Status: DC | PRN
  Administered 2018-02-02: 60 mg via INTRAVENOUS

## 2018-02-02 MED ORDER — ESCITALOPRAM OXALATE 10 MG PO TABS
10.0000 mg | ORAL_TABLET | Freq: Every day | ORAL | Status: DC
  Administered 2018-02-03: 10 mg via ORAL
  Filled 2018-02-02: qty 1

## 2018-02-02 MED ORDER — PROPOFOL 10 MG/ML IV BOLUS
INTRAVENOUS | Status: DC | PRN
  Administered 2018-02-02: 10 mg via INTRAVENOUS
  Administered 2018-02-02 (×2): 20 mg via INTRAVENOUS
  Administered 2018-02-02: 80 mg via INTRAVENOUS
  Administered 2018-02-02: 20 mg via INTRAVENOUS

## 2018-02-02 MED ORDER — EPINEPHRINE PF 1 MG/10ML IJ SOSY
PREFILLED_SYRINGE | INTRAMUSCULAR | Status: AC
  Filled 2018-02-02: qty 10

## 2018-02-02 MED ORDER — METOPROLOL SUCCINATE ER 25 MG PO TB24
25.0000 mg | ORAL_TABLET | Freq: Every day | ORAL | Status: DC
  Administered 2018-02-03: 25 mg via ORAL
  Filled 2018-02-02: qty 1

## 2018-02-02 MED ORDER — SODIUM CHLORIDE (PF) 0.9 % IJ SOLN
PREFILLED_SYRINGE | INTRAMUSCULAR | Status: DC | PRN
  Administered 2018-02-02: 2 mL

## 2018-02-02 NOTE — Interval H&P Note (Signed)
History and Physical Interval Note:  02/02/2018 3:16 PM  Megan Donaldson  has presented today for surgery, with the diagnosis of Hematemesis, melena  The various methods of treatment have been discussed with the patient and family. After consideration of risks, benefits and other options for treatment, the patient has consented to  Procedure(s): ESOPHAGOGASTRODUODENOSCOPY (EGD) (N/A) as a surgical intervention .  The patient's history has been reviewed, patient examined, no change in status, stable for surgery.  I have reviewed the patient's chart and labs.  Questions were answered to the patient's satisfaction.     [place], [place]

## 2018-02-02 NOTE — Progress Notes (Signed)
PT Cancellation Note  Pt at a test/procedure and not available for PT evaluation.  Will attempt to see pt at another date/time as appropriate.    Ovidio Hanger. Scott Kierah Goatley PT, DPT 02/02/18, 3:03 PM

## 2018-02-02 NOTE — Anesthesia Postprocedure Evaluation (Signed)
Anesthesia Post Note  Patient: Imelda PillowRuth M Hanak  Procedure(s) Performed: ESOPHAGOGASTRODUODENOSCOPY (EGD) (N/A )  Patient location during evaluation: PACU Anesthesia Type: General Level of consciousness: awake and alert Pain management: pain level controlled Vital Signs Assessment: post-procedure vital signs reviewed and stable Respiratory status: spontaneous breathing, nonlabored ventilation, respiratory function stable and patient connected to nasal cannula oxygen Cardiovascular status: blood pressure returned to baseline and stable Postop Assessment: no apparent nausea or vomiting Anesthetic complications: no     Last Vitals:  Vitals:   02/02/18 1615 02/02/18 1634  BP: (!) 147/73 134/65  Pulse: 87 83  Resp: 12 16  Temp:  36.8 C  SpO2: 97% 96%    Last Pain:  Vitals:   02/02/18 1615  TempSrc:   PainSc: 0-No pain                 Yevette EdwardsJames G Adams

## 2018-02-02 NOTE — Anesthesia Post-op Follow-up Note (Signed)
Anesthesia QCDR form completed.        

## 2018-02-02 NOTE — Transfer of Care (Signed)
Immediate Anesthesia Transfer of Care Note  Patient: Megan Donaldson  Procedure(s) Performed: ESOPHAGOGASTRODUODENOSCOPY (EGD) (N/A )  Patient Location: Endoscopy Unit  Anesthesia Type:General  Level of Consciousness: drowsy and patient cooperative  Airway & Oxygen Therapy: Patient Spontanous Breathing and Patient connected to nasal cannula oxygen  Post-op Assessment: Report given to RN and Post -op Vital signs reviewed and stable  Post vital signs: Reviewed and stable  Last Vitals:  Vitals Value Taken Time  BP 123/65 02/02/2018  3:54 PM  Temp 36.2 C 02/02/2018  3:53 PM  Pulse 90 02/02/2018  3:54 PM  Resp 16 02/02/2018  3:54 PM  SpO2 97 % 02/02/2018  3:54 PM    Last Pain:  Vitals:   02/02/18 1553  TempSrc: Tympanic  PainSc:          Complications: No apparent anesthesia complications

## 2018-02-02 NOTE — Consult Note (Addendum)
GI Inpatient Consult Note  Reason for Consult: GI Bleed   Attending Requesting Consult: Dr. Nemiah CommanderKalisetti  History of Present Illness: Megan Donaldson is a 84 y.o. female seen for evaluation of GI bleed at the request of Dr. Nemiah CommanderKalisetti. Pt has a PMH of hx of peptic ulcer disease, dementia, congestive heart failure, GERD, personal hx of pulmonary embolism 04/2010, hyperlipidemia, hypertension, osteoporisis. She is a resident at [facility]. She was admitted yesterday for one episode of apparent hematemesis. She was also found to be guaiac positive on rectal exam in the ED. Hemoglobin 10.6 yesterday and has dropped to 9.6 today.   Pt seen and examined this afternoon resting comfortably in hospital bed. She denies any acute events overnight. She reports no recurrent episodes of hematemesis. She denies any nausea, dysphagia, or odynophagia. She does report a hx of bleeding gastric ulcers 01/2010 that required transfusions and hospitalizations likely s/t chronic NSAID use. No current NSAID use. Baseline hemoglobin appears to be mid 11s, 4 months ago hemoglobin 11.7. She has followed in the past with [facility] and last saw Dr. Mechele CollinElliott in 2015. She has remained NPO since admission. She does endorse some mild LUQ abd pain that she rates a 2/10 in severity. No radiation of pain. She denies any aggravating or alleviating factors. Pt had one episode of melena this afternoon. No bright red blood per rectum. Pt is DNR.   Last Colonoscopy: 2011 Last Endoscopy: 2011     Past Medical History:  Past Medical History:  Diagnosis Date  . CHF (congestive heart failure) (HCC)   . Dementia (HCC)   . GERD (gastroesophageal reflux disease)   . Hyperlipidemia   . Hypertension   . Osteoporosis   . Personal history of other venous thrombosis and embolism   . Personal history of PE (pulmonary embolism)     Problem List: Patient Active Problem List   Diagnosis Date Noted  . GI bleed 02/01/2018   . Altered mental status 09/17/2017  . Seizure (HCC) 08/07/2017    Past Surgical History: Past Surgical History:  Procedure Laterality Date  . HIP CLOSED REDUCTION Right 12/21/2014   Procedure: CLOSED REDUCTION HIP;  Surgeon: Deeann SaintHoward Miller, MD;  Location: [facility] ORS;  Service: Orthopedics;  Laterality: Right;  . TOTAL HIP ARTHROPLASTY Right    x3    Allergies: Allergies  Allergen Reactions  . Amoxicillin     Has patient had a PCN reaction causing immediate rash, facial/tongue/throat swelling, SOB or lightheadedness with hypotension: Unknown Has patient had a PCN reaction causing severe rash involving mucus membranes or skin necrosis: Unknown Has patient had a PCN reaction that required hospitalization: Unknown Has patient had a PCN reaction occurring within the last 10 years: Unknown If all of the above answers are "NO", then may proceed with Cephalosporin use.   . Latex   . Lisinopril     Home Medications: Medications Prior to Admission  Medication Sig Dispense Refill Last Dose  . acetaminophen (TYLENOL) 500 MG tablet Take 1,000 mg by mouth 2 (two) times daily.    02/01/2018 at 0800  . aspirin EC 81 MG tablet Take 1 tablet by mouth daily.   02/01/2018 at 0800  . Calcium Carbonate-Vitamin D3 (CALCIUM 600-D) 600-400 MG-UNIT TABS Take 1 tablet by mouth 2 (two) times daily.   02/01/2018 at 0800  . escitalopram (LEXAPRO) 10 MG tablet Take 10 mg by mouth daily.  11 02/01/2018 at 0800  . lamoTRIgine (LAMICTAL) 25 MG tablet Take 50  mg by mouth 2 (two) times daily.   02/01/2018 at 0800  . levothyroxine (SYNTHROID, LEVOTHROID) 75 MCG tablet Take 75 mcg by mouth daily.   02/01/2018 at 0800  . metoprolol succinate (TOPROL-XL) 25 MG 24 hr tablet Take 1 tablet (25 mg total) by mouth daily. 30 tablet 0 02/01/2018 at 0800  . pantoprazole (PROTONIX) 40 MG tablet Take 40 mg by mouth daily.   02/01/2018 at 0800  . Polyethylene Glycol 3350 (PEG 3350) POWD Take 17 g by mouth daily as needed.   prn at  prn  . risedronate (ACTONEL) 35 MG tablet Take 35 mg by mouth every 7 (seven) days. Take with full glass of water. Do not lie down for the next 30 minutes.  11 01/26/2018 at 0800  . simvastatin (ZOCOR) 40 MG tablet Take 40 mg by mouth at bedtime.   01/31/2018 at 2000  . traZODone (DESYREL) 50 MG tablet Take 1 tablet (50 mg total) by mouth at bedtime. 30 tablet 0 01/31/2018 at 2000   Home medication reconciliation was completed with the patient.   Scheduled Inpatient Medications:    Continuous Inpatient Infusions:   . sodium chloride 100 mL/hr at 02/02/18 1226  . pantoprozole (PROTONIX) infusion 8 mg/hr (02/02/18 1226)    PRN Inpatient Medications:  acetaminophen **OR** acetaminophen, ondansetron **OR** ondansetron (ZOFRAN) IV  Family History: family history includes CAD in her father.  The patient's family history is negative for inflammatory bowel disorders, GI malignancy, or solid organ transplantation.  Social History:   reports that she has never smoked. She has never used smokeless tobacco. She reports that she does not drink alcohol or use drugs. The patient denies ETOH, tobacco, or drug use.   Review of Systems: Constitutional: Weight is stable.  Eyes: No changes in vision. ENT: No oral lesions, sore throat.  GI: see HPI.  Heme/Lymph: No easy bruising.  CV: No chest pain.  GU: No hematuria.  Integumentary: No rashes.  Neuro: No headaches.  Psych: No depression/anxiety.  Endocrine: No heat/cold intolerance.  Allergic/Immunologic: No urticaria.  Resp: No cough, SOB.  Musculoskeletal: No joint swelling.    Physical Examination: BP (!) 142/77 (BP Location: Left Arm)   Pulse 88   Temp 98.9 F (37.2 C) (Oral)   Resp 19   Ht 5\' 3"  (1.6 m)   Wt 88.9 kg   SpO2 96%   BMI 34.72 kg/m  Gen: NAD, obese female laying in hospital bed. Answers most questions clearly. HEENT: PEERLA, EOMI, Neck: supple, no JVD or thyromegaly Chest: CTA bilaterally, no wheezes, crackles, or  other adventitious sounds CV: RRR, no m/g/c/r Abd: nondistended obese abdomen, mild TTP in LUQ and epigastric regions, +BS in all four quadrants; no HSM, guarding, ridigity, or rebound tenderness Ext: no edema, well perfused with 2+ pulses, Skin: no rash or lesions noted Lymph: no LAD  Data: Lab Results  Component Value Date   WBC 7.8 02/01/2018   HGB 9.6 (L) 02/02/2018   HCT 31.0 (L) 02/02/2018   MCV 87.7 02/01/2018   PLT 186 02/01/2018   Recent Labs  Lab 02/01/18 1851 02/02/18 0200 02/02/18 0819  HGB 10.6* 9.5* 9.6*   Lab Results  Component Value Date   NA 141 02/02/2018   K 4.3 02/02/2018   CL 113 (H) 02/02/2018   CO2 22 02/02/2018   BUN 39 (H) 02/02/2018   CREATININE 1.10 (H) 02/02/2018   Lab Results  Component Value Date   ALT 15 02/01/2018   AST 22 02/01/2018  ALKPHOS 62 02/01/2018   BILITOT 0.4 02/01/2018   Recent Labs  Lab 02/01/18 1859  INR 0.86   Assessment/Plan:  84 y/o Caucasian female with a PMH of hx of peptic ulcer disease, hypertension, hyperlipidemia, osteoporosis, and dementia admitted for hematemesis, suspected GI bleed  1. Hematemesis s/t likely GI bleed 2. Melena  - Differential consists of peptic ulcer disease +/- H pylori, gastritis, esophagitis, duodenitis, Mallory-Weiss tear, AVMs, or malignancy. Lower GI bleed less likely given clinical picture - Agree with IV Protonix and IV fluids - Remain NPO - Hemoglobin has dropped to 9.6 - appears baseline is 11.7. Guaiac positive on exam in ED. - Recommend endoluminal evaluation with upper endoscopy with biopsies and possible treatment for GI bleed as further evaluation. I spoke directly with patient's daughter Megan Donaldson. We discussed procedure details, risks, and complications, including bleeding, infection, small puncture to internal organs, or problems with anesthesia. Pt consents to proceed - Plan for EGD this afternoon with Dr. Norma Fredrickson. Further recs to come following  procedure.   Thank you for the consult. Please call with questions or concerns.  Gilda Crease, PA-C [facility]  [phone]

## 2018-02-02 NOTE — Progress Notes (Signed)
[employer] [facility] [place] [facility]   PATIENT NAME: Dava Rensch    MR#:  [id number]  DATE OF BIRTH:  1927/04/15  SUBJECTIVE:  CHIEF COMPLAINT:   Chief Complaint  Patient presents with  . GI Bleeding   - hematemesis yesterday, none now - hb dropped but stable now  REVIEW OF SYSTEMS:  Review of Systems  Constitutional: Negative for chills and fever.  HENT: Negative for hearing loss.   Eyes: Negative for blurred vision and double vision.  Respiratory: Negative for cough, shortness of breath and wheezing.   Cardiovascular: Negative for chest pain and palpitations.  Gastrointestinal: Positive for abdominal pain. Negative for constipation, diarrhea, nausea and vomiting.  Genitourinary: Negative for dysuria.  Musculoskeletal: Negative for myalgias.  Neurological: Negative for dizziness, focal weakness, seizures, weakness and headaches.  Psychiatric/Behavioral: Negative for depression.    DRUG ALLERGIES:   Allergies  Allergen Reactions  . Amoxicillin     Has patient had a PCN reaction causing immediate rash, facial/tongue/throat swelling, SOB or lightheadedness with hypotension: Unknown Has patient had a PCN reaction causing severe rash involving mucus membranes or skin necrosis: Unknown Has patient had a PCN reaction that required hospitalization: Unknown Has patient had a PCN reaction occurring within the last 10 years: Unknown If all of the above answers are "NO", then may proceed with Cephalosporin use.   . Latex   . Lisinopril     VITALS:  Blood pressure (!) 133/96, pulse 87, temperature 98.7 F (37.1 C), temperature source Oral, resp. rate 16, height 5\' 3"  (1.6 m), weight 88.9 kg, SpO2 95 %.  PHYSICAL EXAMINATION:  Physical Exam   GENERAL:  84 y.o.-year-old patient lying in the bed with no acute distress.  EYES: Pupils equal, round, reactive to light and accommodation. No scleral icterus. Extraocular muscles intact.  HEENT: Head atraumatic,  normocephalic. Oropharynx and nasopharynx clear. Dry mucus membranes NECK:  Supple, no jugular venous distention. No thyroid enlargement, no tenderness.  LUNGS: Normal breath sounds bilaterally, no wheezing, rales,rhonchi or crepitation. No use of accessory muscles of respiration. Decreased bibasilar breath sounds CARDIOVASCULAR: S1, S2 normal. No  rubs, or gallops. 3/6 systolic murmur present ABDOMEN: Soft, nontender, nondistended. Bowel sounds present. No organomegaly or mass.  EXTREMITIES: No pedal edema, cyanosis, or clubbing.  NEUROLOGIC: Cranial nerves II through XII are intact. Muscle strength 5/5 in all extremities. Sensation intact. Gait not checked.  PSYCHIATRIC: The patient is alert and oriented x 2.  SKIN: No obvious rash, lesion, or ulcer.    LABORATORY PANEL:   CBC Recent Labs  Lab 02/01/18 1851  02/02/18 0819  WBC 7.8  --   --   HGB 10.6*   < > 9.6*  HCT 34.1*   < > 31.0*  PLT 186  --   --    < > = values in this interval not displayed.   ------------------------------------------------------------------------------------------------------------------  Chemistries  Recent Labs  Lab 02/01/18 1851 02/02/18 0819  NA 140 141  K 4.1 4.3  CL 107 113*  CO2 26 22  GLUCOSE 172* 95  BUN 32* 39*  CREATININE 1.16* 1.10*  CALCIUM 9.0 8.3*  AST 22  --   ALT 15  --   ALKPHOS 62  --   BILITOT 0.4  --    ------------------------------------------------------------------------------------------------------------------  Cardiac Enzymes No results for input(s): TROPONINI in the last 168 hours. ------------------------------------------------------------------------------------------------------------------  RADIOLOGY:  No results found.  EKG:   Orders placed or performed during the hospital encounter of 02/01/18  .  EKG 12-Lead  . EKG 12-Lead    ASSESSMENT AND PLAN:   84 year old female with past medical history significant for hypertension, osteoporosis, GERD,  dementia from assisted living facility, CHF presents to hospital secondary to hematemesis.  1.  Hematemesis-also has some upper epigastric pain -Hemoglobin did drop by one-point and is stable now -No further active bleeding at this time. -Started on IV Protonix.  GI has been consulted -For EGD this afternoon. -Known history of bleeding gastric ulcers requiring transfusions in the past.  No current NSAID usage -Hold aspirin -No indication for any transfusion at this time.  Vitals are stable.  2.  Dementia and depression-currently meds are on hold.  We can be restarted after the endoscopy. -Patient on Lamictal and Lexapro  3.  Hypothyroidism-restart Synthroid  4.  Hyperlipidemia-on simvastatin  5.  DVT prophylaxis-teds and SCDs only  Physical therapy consult in a.m.     All the records are reviewed and case discussed with Care Management/Social Workerr. Management plans discussed with the patient, family and they are in agreement.  CODE STATUS: DNR  TOTAL TIME TAKING CARE OF THIS PATIENT: 38 minutes.   POSSIBLE D/C IN 1-2 DAYS, DEPENDING ON CLINICAL CONDITION.   Enid BaasKALISETTI,Marria Mathison M.D on 02/02/2018 at 2:15 PM  Between 7am to 6pm - Pager - [phone]  After 6pm go to [email or web address] - Social research officer, governmentpassword [email or web address] [email or web address]  [facility]  Office  [phone]  CC: Primary care physician; Dorothey BasemanBronstein, David, MD

## 2018-02-02 NOTE — Anesthesia Preprocedure Evaluation (Signed)
Anesthesia Evaluation  Patient identified by MRN, date of birth, ID band Patient awake    Reviewed: Allergy & Precautions, NPO status , Patient's Chart, lab work & pertinent test results, reviewed documented beta blocker date and time   Airway Mallampati: III  TM Distance: >3 FB     Dental  (+) Chipped, Poor Dentition   Pulmonary           Cardiovascular hypertension, Pt. on medications and Pt. on home beta blockers +CHF       Neuro/Psych Seizures -,  PSYCHIATRIC DISORDERS Dementia    GI/Hepatic GERD  ,  Endo/Other    Renal/GU      Musculoskeletal   Abdominal   Peds  Hematology   Anesthesia Other Findings Hx of PE. Dementia. Hb 9.8. EKG ok. Echo showed 65-70.  Reproductive/Obstetrics                             Anesthesia Physical Anesthesia Plan  ASA: III  Anesthesia Plan: General   Post-op Pain Management:    Induction: Intravenous  PONV Risk Score and Plan:   Airway Management Planned:   Additional Equipment:   Intra-op Plan:   Post-operative Plan:   Informed Consent: I have reviewed the patients History and Physical, chart, labs and discussed the procedure including the risks, benefits and alternatives for the proposed anesthesia with the patient or authorized representative who has indicated his/her understanding and acceptance.     Plan Discussed with: CRNA  Anesthesia Plan Comments:         Anesthesia Quick Evaluation

## 2018-02-02 NOTE — H&P (View-Only) (Signed)
GI Inpatient Consult Note  Reason for Consult: GI Bleed   Attending Requesting Consult: Dr. Kalisetti  History of Present Illness: Megan Donaldson is a 84 y.o. female seen for evaluation of GI bleed at the request of Dr. Kalisetti. Pt has a PMH of hx of peptic ulcer disease, dementia, congestive heart failure, GERD, personal hx of pulmonary embolism 04/2010, hyperlipidemia, hypertension, osteoporisis. She is a resident at [facility]. She was admitted yesterday for one episode of apparent hematemesis. She was also found to be guaiac positive on rectal exam in the ED. Hemoglobin 10.6 yesterday and has dropped to 9.6 today.   Pt seen and examined this afternoon resting comfortably in hospital bed. She denies any acute events overnight. She reports no recurrent episodes of hematemesis. She denies any nausea, dysphagia, or odynophagia. She does report a hx of bleeding gastric ulcers 01/2010 that required transfusions and hospitalizations likely s/t chronic NSAID use. No current NSAID use. Baseline hemoglobin appears to be mid 11s, 4 months ago hemoglobin 11.7. She has followed in the past with [facility] and last saw Dr. Elliott in 2015. She has remained NPO since admission. She does endorse some mild LUQ abd pain that she rates a 2/10 in severity. No radiation of pain. She denies any aggravating or alleviating factors. Pt had one episode of melena this afternoon. No bright red blood per rectum. Pt is DNR.   Last Colonoscopy: 2011 Last Endoscopy: 2011     Past Medical History:  Past Medical History:  Diagnosis Date  . CHF (congestive heart failure) (HCC)   . Dementia (HCC)   . GERD (gastroesophageal reflux disease)   . Hyperlipidemia   . Hypertension   . Osteoporosis   . Personal history of other venous thrombosis and embolism   . Personal history of PE (pulmonary embolism)     Problem List: Patient Active Problem List   Diagnosis Date Noted  . GI bleed 02/01/2018   . Altered mental status 09/17/2017  . Seizure (HCC) 08/07/2017    Past Surgical History: Past Surgical History:  Procedure Laterality Date  . HIP CLOSED REDUCTION Right 12/21/2014   Procedure: CLOSED REDUCTION HIP;  Surgeon: Howard Miller, MD;  Location: [facility] ORS;  Service: Orthopedics;  Laterality: Right;  . TOTAL HIP ARTHROPLASTY Right    x3    Allergies: Allergies  Allergen Reactions  . Amoxicillin     Has patient had a PCN reaction causing immediate rash, facial/tongue/throat swelling, SOB or lightheadedness with hypotension: Unknown Has patient had a PCN reaction causing severe rash involving mucus membranes or skin necrosis: Unknown Has patient had a PCN reaction that required hospitalization: Unknown Has patient had a PCN reaction occurring within the last 10 years: Unknown If all of the above answers are "NO", then may proceed with Cephalosporin use.   . Latex   . Lisinopril     Home Medications: Medications Prior to Admission  Medication Sig Dispense Refill Last Dose  . acetaminophen (TYLENOL) 500 MG tablet Take 1,000 mg by mouth 2 (two) times daily.    02/01/2018 at 0800  . aspirin EC 81 MG tablet Take 1 tablet by mouth daily.   02/01/2018 at 0800  . Calcium Carbonate-Vitamin D3 (CALCIUM 600-D) 600-400 MG-UNIT TABS Take 1 tablet by mouth 2 (two) times daily.   02/01/2018 at 0800  . escitalopram (LEXAPRO) 10 MG tablet Take 10 mg by mouth daily.  11 02/01/2018 at 0800  . lamoTRIgine (LAMICTAL) 25 MG tablet Take 50   mg by mouth 2 (two) times daily.   02/01/2018 at 0800  . levothyroxine (SYNTHROID, LEVOTHROID) 75 MCG tablet Take 75 mcg by mouth daily.   02/01/2018 at 0800  . metoprolol succinate (TOPROL-XL) 25 MG 24 hr tablet Take 1 tablet (25 mg total) by mouth daily. 30 tablet 0 02/01/2018 at 0800  . pantoprazole (PROTONIX) 40 MG tablet Take 40 mg by mouth daily.   02/01/2018 at 0800  . Polyethylene Glycol 3350 (PEG 3350) POWD Take 17 g by mouth daily as needed.   prn at  prn  . risedronate (ACTONEL) 35 MG tablet Take 35 mg by mouth every 7 (seven) days. Take with full glass of water. Do not lie down for the next 30 minutes.  11 01/26/2018 at 0800  . simvastatin (ZOCOR) 40 MG tablet Take 40 mg by mouth at bedtime.   01/31/2018 at 2000  . traZODone (DESYREL) 50 MG tablet Take 1 tablet (50 mg total) by mouth at bedtime. 30 tablet 0 01/31/2018 at 2000   Home medication reconciliation was completed with the patient.   Scheduled Inpatient Medications:    Continuous Inpatient Infusions:   . sodium chloride 100 mL/hr at 02/02/18 1226  . pantoprozole (PROTONIX) infusion 8 mg/hr (02/02/18 1226)    PRN Inpatient Medications:  acetaminophen **OR** acetaminophen, ondansetron **OR** ondansetron (ZOFRAN) IV  Family History: family history includes CAD in her father.  The patient's family history is negative for inflammatory bowel disorders, GI malignancy, or solid organ transplantation.  Social History:   reports that she has never smoked. She has never used smokeless tobacco. She reports that she does not drink alcohol or use drugs. The patient denies ETOH, tobacco, or drug use.   Review of Systems: Constitutional: Weight is stable.  Eyes: No changes in vision. ENT: No oral lesions, sore throat.  GI: see HPI.  Heme/Lymph: No easy bruising.  CV: No chest pain.  GU: No hematuria.  Integumentary: No rashes.  Neuro: No headaches.  Psych: No depression/anxiety.  Endocrine: No heat/cold intolerance.  Allergic/Immunologic: No urticaria.  Resp: No cough, SOB.  Musculoskeletal: No joint swelling.    Physical Examination: BP (!) 142/77 (BP Location: Left Arm)   Pulse 88   Temp 98.9 F (37.2 C) (Oral)   Resp 19   Ht 5' 3" (1.6 m)   Wt 88.9 kg   SpO2 96%   BMI 34.72 kg/m  Gen: NAD, obese female laying in hospital bed. Answers most questions clearly. HEENT: PEERLA, EOMI, Neck: supple, no JVD or thyromegaly Chest: CTA bilaterally, no wheezes, crackles, or  other adventitious sounds CV: RRR, no m/g/c/r Abd: nondistended obese abdomen, mild TTP in LUQ and epigastric regions, +BS in all four quadrants; no HSM, guarding, ridigity, or rebound tenderness Ext: no edema, well perfused with 2+ pulses, Skin: no rash or lesions noted Lymph: no LAD  Data: Lab Results  Component Value Date   WBC 7.8 02/01/2018   HGB 9.6 (L) 02/02/2018   HCT 31.0 (L) 02/02/2018   MCV 87.7 02/01/2018   PLT 186 02/01/2018   Recent Labs  Lab 02/01/18 1851 02/02/18 0200 02/02/18 0819  HGB 10.6* 9.5* 9.6*   Lab Results  Component Value Date   NA 141 02/02/2018   K 4.3 02/02/2018   CL 113 (H) 02/02/2018   CO2 22 02/02/2018   BUN 39 (H) 02/02/2018   CREATININE 1.10 (H) 02/02/2018   Lab Results  Component Value Date   ALT 15 02/01/2018   AST 22 02/01/2018     ALKPHOS 62 02/01/2018   BILITOT 0.4 02/01/2018   Recent Labs  Lab 02/01/18 1859  INR 0.86   Assessment/Plan:  84 y/o Caucasian female with a PMH of hx of peptic ulcer disease, hypertension, hyperlipidemia, osteoporosis, and dementia admitted for hematemesis, suspected GI bleed  1. Hematemesis s/t likely GI bleed 2. Melena  - Differential consists of peptic ulcer disease +/- H pylori, gastritis, esophagitis, duodenitis, Mallory-Weiss tear, AVMs, or malignancy. Lower GI bleed less likely given clinical picture - Agree with IV Protonix and IV fluids - Remain NPO - Hemoglobin has dropped to 9.6 - appears baseline is 11.7. Guaiac positive on exam in ED. - Recommend endoluminal evaluation with upper endoscopy with biopsies and possible treatment for GI bleed as further evaluation. I spoke directly with patient's daughter - Barabara Kassman. We discussed procedure details, risks, and complications, including bleeding, infection, small puncture to internal organs, or problems with anesthesia. Pt consents to proceed - Plan for EGD this afternoon with Dr. Toledo. Further recs to come following  procedure.   Thank you for the consult. Please call with questions or concerns.  [place]anville M Croley, PA-C [facility]  [phone] 

## 2018-02-02 NOTE — Op Note (Signed)
[facility] Gastroenterology Patient Name: Jakelin Taussig Procedure Date: 02/02/2018 3:17 PM MRN: [id number] Account #: [id number] Date of Birth: 12/30/1927 Admit Type: Inpatient Age: 84 Room: [facility] ENDO ROOM 3 Gender: Female Note Status: Finalized Procedure:            Upper GI endoscopy Indications:          Recent gastrointestinal bleeding, Suspected upper                        gastrointestinal bleeding, Iron deficiency anemia due                        to suspected upper gastrointestinal bleeding, Heme                        positive stool Providers:            Boykin Nearing. Bauer Ausborn MD, MD Medicines:            Propofol per Anesthesia Complications:        No immediate complications. Procedure:            Pre-Anesthesia Assessment:                       - The risks and benefits of the procedure and the                        sedation options and risks were discussed with the                        patient. All questions were answered and informed                        consent was obtained.                       - Patient identification and proposed procedure were                        verified prior to the procedure by the nurse. The                        procedure was verified in the procedure room.                       - ASA Grade Assessment: III - A patient with severe                        systemic disease.                       - After reviewing the risks and benefits, the patient                        was deemed in satisfactory condition to undergo the                        procedure.                       After obtaining informed consent, the endoscope was  passed under direct vision. Throughout the procedure,                        the patient's blood pressure, pulse, and oxygen                        saturations were monitored continuously. The Endoscope                        was introduced through the mouth, and advanced to  the                        third part of duodenum. The upper GI endoscopy was                        accomplished without difficulty. The patient tolerated                        the procedure well. Findings:      One cratered and linear esophageal ulcer with no bleeding and stigmata       of recent bleeding was found at the gastroesophageal junction. The       lesion was 15 mm in largest dimension. Area was successfully injected       with 2 mL of a 1:10,000 solution of epinephrine for hemostasis.       Coagulation for bleeding prevention using bipolar probe was successful.      One gastric ulcer was found at the gastroesophageal junction. Treated       with injection and cautery hemostasis as above.      The exam was otherwise without abnormality.      A 8 mm non-bleeding diverticulum was found in the area of the papilla. Impression:           - Non-bleeding esophageal ulcer. Injected. Treated with                        bipolar cautery.                       - Gastric ulcer.                       - The examination was otherwise normal.                       - Non-bleeding duodenal diverticulum.                       - No specimens collected. Recommendation:       - Return patient to hospital ward for ongoing care.                       - NPO.                       - May have po meds, ice chips, small sips of water.                       - Continue IV protonix gtt Procedure Code(s):    --- Professional ---  43255, Esophagogastroduodenoscopy, flexible, transoral;                        with control of bleeding, any method Diagnosis Code(s):    --- Professional ---                       K57.10, Diverticulosis of small intestine without                        perforation or abscess without bleeding                       R19.5, Other fecal abnormalities                       D50.9, Iron deficiency anemia, unspecified                       K25.9, Gastric ulcer,  unspecified as acute or chronic,                        without hemorrhage or perforation                       K22.10, Ulcer of esophagus without bleeding                       K92.2, Gastrointestinal hemorrhage, unspecified CPT copyright 2018 American Medical Association. All rights reserved. The codes documented in this report are preliminary and upon coder review may  be revised to meet current compliance requirements. Stanton Kidney MD, MD 02/02/2018 3:51:42 PM This report has been signed electronically. Number of Addenda: 0 Note Initiated On: 02/02/2018 3:17 PM      [facility]

## 2018-02-03 LAB — CBC
HCT: 27.8 % — ABNORMAL LOW (ref 36.0–46.0)
Hemoglobin: 8.5 g/dL — ABNORMAL LOW (ref 12.0–15.0)
MCH: 27.5 pg (ref 26.0–34.0)
MCHC: 30.6 g/dL (ref 30.0–36.0)
MCV: 90 fL (ref 80.0–100.0)
Platelets: 162 10*3/uL (ref 150–400)
RBC: 3.09 MIL/uL — ABNORMAL LOW (ref 3.87–5.11)
RDW: 16.1 % — ABNORMAL HIGH (ref 11.5–15.5)
WBC: 4.9 10*3/uL (ref 4.0–10.5)
nRBC: 0 % (ref 0.0–0.2)

## 2018-02-03 LAB — BASIC METABOLIC PANEL
Anion gap: 5 (ref 5–15)
BUN: 23 mg/dL (ref 8–23)
CHLORIDE: 114 mmol/L — AB (ref 98–111)
CO2: 23 mmol/L (ref 22–32)
Calcium: 8 mg/dL — ABNORMAL LOW (ref 8.9–10.3)
Creatinine, Ser: 0.95 mg/dL (ref 0.44–1.00)
GFR calc Af Amer: >60 mL/min (ref >60)
GFR calc non Af Amer: 53 mL/min — ABNORMAL LOW (ref >60)
Glucose, Bld: 84 mg/dL (ref 70–99)
Potassium: 3.5 mmol/L (ref 3.5–5.1)
Sodium: 142 mmol/L (ref 135–145)

## 2018-02-03 MED ORDER — PANTOPRAZOLE SODIUM 40 MG PO TBEC: 40.0000 mg | DELAYED_RELEASE_TABLET | Freq: Two times a day (BID) | ORAL | 0 refills | Status: DC

## 2018-02-03 MED ORDER — PANTOPRAZOLE SODIUM 40 MG PO TBEC: 40.0000 mg | DELAYED_RELEASE_TABLET | Freq: Two times a day (BID) | ORAL | Status: DC

## 2018-02-03 NOTE — Care Management (Signed)
Per [facility] will be setting home health services for patient after she returns

## 2018-02-03 NOTE — Progress Notes (Signed)
Per MD okay for RN to DC Protonix drip and place order for PO 40 mg BID.

## 2018-02-03 NOTE — Progress Notes (Addendum)
GI Inpatient Follow-up Note  HPI: Patient seen in follow-up for GI bleeding after one episode of hematemesis, melena, and hemoccult positive stool. EGD performed yesterday by Dr. Norma Fredricksonoledo showed one cratered and linear esophageal ulcer with no bleeding and stigmata of recent bleeding found at GEJ. This area was injected and treated with bipolar cautery. She also had a gastric ulcer that was found at the GEJ junction that was injected and treated with cautery.   Pt seen and examined this afternoon resting comfortably in hospital bed. She denies any acute events overnight. One BM this morning was apparently non-bloody and non-melanotic. She was started on soft diet this morning and was able to eat some bacon, eggs, peaches, and coffee without any difficulty. She denies any abdominal pain, nausea, vomiting, or dysphagia. She was switched from Protonix drip to PO 40 mg BID.   Scheduled Inpatient Medications:  . escitalopram  10 mg Oral Daily  . lamoTRIgine  50 mg Oral BID  . levothyroxine  75 mcg Oral Q0600  . metoprolol succinate  25 mg Oral Daily  . pantoprazole  40 mg Oral BID    Continuous Inpatient Infusions:   . sodium chloride 100 mL/hr at 02/03/18 0930    PRN Inpatient Medications:  acetaminophen **OR** acetaminophen, ondansetron **OR** ondansetron (ZOFRAN) IV  Review of Systems: Constitutional: Weight is stable.  Eyes: No changes in vision. ENT: No oral lesions, sore throat.  GI: see HPI.  Heme/Lymph: No easy bruising.  CV: No chest pain.  GU: No hematuria.  Integumentary: No rashes.  Neuro: No headaches.  Psych: No depression/anxiety.  Endocrine: No heat/cold intolerance.  Allergic/Immunologic: No urticaria.  Resp: No cough, SOB.  Musculoskeletal: No joint swelling.    Physical Examination: BP (!) 143/78 (BP Location: Right Arm)   Pulse 75   Temp 98.1 F (36.7 C) (Oral)   Resp 16   Ht 5\' 3"  (1.6 m)   Wt 88.9 kg   SpO2 97%   BMI 34.72 kg/m  Gen: NAD, alert and  oriented x 4 HEENT: PEERLA, EOMI, Neck: supple, no JVD or thyromegaly Chest: CTA bilaterally, no wheezes, crackles, or other adventitious sounds CV: RRR, no m/g/c/r Abd: soft, nontender to palpation in all four quadrants, ND, +BS in all four quadrants; no HSM, guarding, ridigity, or rebound tenderness Ext: no edema, well perfused with 2+ pulses, Skin: no rash or lesions noted Lymph: no LAD  Data: Lab Results  Component Value Date   WBC 4.9 02/03/2018   HGB 8.5 (L) 02/03/2018   HCT 27.8 (L) 02/03/2018   MCV 90.0 02/03/2018   PLT 162 02/03/2018   Recent Labs  Lab 02/02/18 1415 02/02/18 2003 02/03/18 0526  HGB 9.8* 9.2* 8.5*   Lab Results  Component Value Date   NA 142 02/03/2018   K 3.5 02/03/2018   CL 114 (H) 02/03/2018   CO2 23 02/03/2018   BUN 23 02/03/2018   CREATININE 0.95 02/03/2018   Lab Results  Component Value Date   ALT 15 02/01/2018   AST 22 02/01/2018   ALKPHOS 62 02/01/2018   BILITOT 0.4 02/01/2018   Recent Labs  Lab 02/01/18 1859  INR 0.86   Assessment/Plan:  84 y/o Caucasian female seen s/p EGD yesterday showing esophageal and gastric ulcers that were injected and cauterized  1. Peptic ulcer disease - gastric and esophageal ulcers - She is s/p EGD yesterday for treatment of esophageal and gastric ulcers - H pylori stool antigen is pending - Hemoglobin has dropped to 8.5  today, no signs of overt GI bleeding - Continue Protonix 40 mg BID for the next three months - Avoid NSAIDs, hot, spicy, greasy, fatty foods, tomato-based food products - She may follow-up as an outpatient with the GI clinic - Continue to advance diet as tolerated - Recommend holding baby aspirin and risendronate x 1 month - Anticipate discharge tonight or tomorrow pending discharge planning  Please call with questions or concerns.    Jacob MooresMason Croley, PA-C [facility]  [phone]

## 2018-02-03 NOTE — Progress Notes (Signed)
Megan Donaldson M Dittmer  A and O x 4. VSS. Pt tolerating diet well. No complaints of pain or nausea. IV removed intact, prescriptions given. Pt voiced understanding of discharge instructions with no further questions. Pt discharged via wheelchair with NT     Allergies as of 02/03/2018      Reactions   Amoxicillin    Has patient had a PCN reaction causing immediate rash, facial/tongue/throat swelling, SOB or lightheadedness with hypotension: Unknown Has patient had a PCN reaction causing severe rash involving mucus membranes or skin necrosis: Unknown Has patient had a PCN reaction that required hospitalization: Unknown Has patient had a PCN reaction occurring within the last 10 years: Unknown If all of the above answers are "NO", then may proceed with Cephalosporin use.   Latex    Lisinopril       Medication List    STOP taking these medications   aspirin EC 81 MG tablet     TAKE these medications   acetaminophen 500 MG tablet Commonly known as:  TYLENOL Take 1,000 mg by mouth 2 (two) times daily.   CALCIUM 600-D 600-400 MG-UNIT Tabs Generic drug:  Calcium Carbonate-Vitamin D3 Take 1 tablet by mouth 2 (two) times daily.   escitalopram 10 MG tablet Commonly known as:  LEXAPRO Take 10 mg by mouth daily.   lamoTRIgine 25 MG tablet Commonly known as:  LAMICTAL Take 50 mg by mouth 2 (two) times daily.   levothyroxine 75 MCG tablet Commonly known as:  SYNTHROID, LEVOTHROID Take 75 mcg by mouth daily.   metoprolol succinate 25 MG 24 hr tablet Commonly known as:  TOPROL-XL Take 1 tablet (25 mg total) by mouth daily.   pantoprazole 40 MG tablet Commonly known as:  PROTONIX Take 1 tablet (40 mg total) by mouth 2 (two) times daily. What changed:  when to take this   PEG 3350 Powd Take 17 g by mouth daily as needed.   risedronate 35 MG tablet Commonly known as:  ACTONEL Take 35 mg by mouth every 7 (seven) days. Take with full glass of water. Do not lie down for the next 30  minutes.   simvastatin 40 MG tablet Commonly known as:  ZOCOR Take 40 mg by mouth at bedtime.   traZODone 50 MG tablet Commonly known as:  DESYREL Take 1 tablet (50 mg total) by mouth at bedtime.       Vitals:   02/03/18 0825 02/03/18 1210  BP: (!) 153/88 (!) 143/78  Pulse: 86 75  Resp:  16  Temp:  98.1 F (36.7 C)  SpO2:  97%    Megan CloudJuan G Rodriguez Donaldson

## 2018-02-03 NOTE — Discharge Summary (Addendum)
[facility]   PATIENT NAME: Megan ShihRuth Donaldson    MR#:  [id number]  DATE OF BIRTH:  05/04/1927  DATE OF ADMISSION:  02/01/2018 ADMITTING PHYSICIAN: Ihor AustinPavan Joyanna Kleman, MD  DATE OF DISCHARGE: 02/03/2018  PRIMARY CARE PHYSICIAN: Dorothey BasemanBronstein, David, MD   ADMISSION DIAGNOSIS:  Gastrointestinal hemorrhage, unspecified gastrointestinal hemorrhage type [K92.2] Anemia Hypertension History of peptic ulcer disease Hyperlipidemia Osteoporosis DISCHARGE DIAGNOSIS:  Active Problems:   GI bleed Hypertension Osteoporosis Esophageal ulcer  SECONDARY DIAGNOSIS:   Past Medical History:  Diagnosis Date  . CHF (congestive heart failure) (HCC)   . Dementia (HCC)   . GERD (gastroesophageal reflux disease)   . Hyperlipidemia   . Hypertension   . Osteoporosis   . Personal history of other venous thrombosis and embolism   . Personal history of PE (pulmonary embolism)      ADMITTING HISTORY Megan Donaldson  is a 84 y.o. female with a known history of peptic ulcer disease, dementia, congestive heart failure chronic, GERD, hyperlipidemia, hypertension, osteoporosis, pulmonary medicine in the past he is a resident of [facility].  She vomited blood this morning.  She also had an episode of rectal bleed.  Patient presented to emergency room hemoglobin is around 10 she was started on IV fluids and Protonix.  Gastroenterology was consulted.  Hospitalist service was consulted.  HOSPITAL COURSE:  Patient was admitted to medical floor.  Serial hemoglobin hematocrit was monitored.  Patient was started on IV Protonix drip and received IV fluids.  Patient is a resident of [facility].  Gastroenterology consultation was done.  Patient underwent upper endoscopy.One cratered and linear esophageal ulcer with no bleeding and stigmata of recent bleeding wasfound at the gastroesophageal junction. The lesion was 15 mm in largest dimension.  Area wassuccessfully injected with 2 mL of a 1:10,000solution of epinephrine for hemostasis. Coagulationfor bleeding prevention using bipolar probe was successful.  IV Protonix drip was stopped and patient was started on oral Protonix.  Aspirin was held.  Patient was started on oral soft diet.  Patient will be discharged to the [facility] assisted facility .  CONSULTS OBTAINED:  Treatment Team:  Stanton Kidneyoledo, Teodoro K, MD  DRUG ALLERGIES:   Allergies  Allergen Reactions  . Amoxicillin     Has patient had a PCN reaction causing immediate rash, facial/tongue/throat swelling, SOB or lightheadedness with hypotension: Unknown Has patient had a PCN reaction causing severe rash involving mucus membranes or skin necrosis: Unknown Has patient had a PCN reaction that required hospitalization: Unknown Has patient had a PCN reaction occurring within the last 10 years: Unknown If all of the above answers are "NO", then may proceed with Cephalosporin use.   . Latex   . Lisinopril     DISCHARGE MEDICATIONS:   Allergies as of 02/03/2018      Reactions   Amoxicillin    Has patient had a PCN reaction causing immediate rash, facial/tongue/throat swelling, SOB or lightheadedness with hypotension: Unknown Has patient had a PCN reaction causing severe rash involving mucus membranes or skin necrosis: Unknown Has patient had a PCN reaction that required hospitalization: Unknown Has patient had a PCN reaction occurring within the last 10 years: Unknown If all of the above answers are "NO", then may proceed with Cephalosporin use.   Latex    Lisinopril       Medication List    STOP taking these medications   aspirin EC 81 MG tablet     TAKE  these medications   acetaminophen 500 MG tablet Commonly known as:  TYLENOL Take 1,000 mg by mouth 2 (two) times daily.   CALCIUM 600-D 600-400 MG-UNIT Tabs Generic drug:  Calcium Carbonate-Vitamin D3 Take 1 tablet by mouth 2 (two) times daily.   escitalopram 10  MG tablet Commonly known as:  LEXAPRO Take 10 mg by mouth daily.   lamoTRIgine 25 MG tablet Commonly known as:  LAMICTAL Take 50 mg by mouth 2 (two) times daily.   levothyroxine 75 MCG tablet Commonly known as:  SYNTHROID, LEVOTHROID Take 75 mcg by mouth daily.   metoprolol succinate 25 MG 24 hr tablet Commonly known as:  TOPROL-XL Take 1 tablet (25 mg total) by mouth daily.   pantoprazole 40 MG tablet Commonly known as:  PROTONIX Take 1 tablet (40 mg total) by mouth 2 (two) times daily. What changed:  when to take this   PEG 3350 Powd Take 17 g by mouth daily as needed.   risedronate 35 MG tablet Commonly known as:  ACTONEL Take 35 mg by mouth every 7 (seven) days. Take with full glass of water. Do not lie down for the next 30 minutes.   simvastatin 40 MG tablet Commonly known as:  ZOCOR Take 40 mg by mouth at bedtime.   traZODone 50 MG tablet Commonly known as:  DESYREL Take 1 tablet (50 mg total) by mouth at bedtime.       Today  Patient seen and evaluated today No new episodes of bleeding Tolerated diet well Discharge to [facility] once okay with physical therapy  VITAL SIGNS:  Blood pressure (!) 143/78, pulse 75, temperature 98.1 F (36.7 C), temperature source Oral, resp. rate 16, height 5\' 3"  (1.6 m), weight 88.9 kg, SpO2 97 %.  I/O:    Intake/Output Summary (Last 24 hours) at 02/03/2018 1305 Last data filed at 02/03/2018 0930 Gross per 24 hour  Intake 2006.5 ml  Output -  Net 2006.5 ml    PHYSICAL EXAMINATION:  Physical Exam  GENERAL:  84 y.o.-year-old patient lying in the bed with no acute distress.  LUNGS: Normal breath sounds bilaterally, no wheezing, rales,rhonchi or crepitation. No use of accessory muscles of respiration.  CARDIOVASCULAR: S1, S2 normal. No murmurs, rubs, or gallops.  ABDOMEN: Soft, non-tender, non-distended. Bowel sounds present. No organomegaly or mass.  NEUROLOGIC: Moves all 4  extremities. PSYCHIATRIC: The patient is alert and oriented x 3.  SKIN: No obvious rash, lesion, or ulcer.   DATA REVIEW:   CBC Recent Labs  Lab 02/03/18 0526  WBC 4.9  HGB 8.5*  HCT 27.8*  PLT 162    Chemistries  Recent Labs  Lab 02/01/18 1851  02/03/18 0526  NA 140   < > 142  K 4.1   < > 3.5  CL 107   < > 114*  CO2 26   < > 23  GLUCOSE 172*   < > 84  BUN 32*   < > 23  CREATININE 1.16*   < > 0.95  CALCIUM 9.0   < > 8.0*  AST 22  --   --   ALT 15  --   --   ALKPHOS 62  --   --   BILITOT 0.4  --   --    < > = values in this interval not displayed.    Cardiac Enzymes No results for input(s): TROPONINI in the last 168 hours.  Microbiology Results  Results for orders placed or performed  during the hospital encounter of 02/01/18  MRSA PCR Screening     Status: None   Collection Time: 02/01/18  9:21 PM  Result Value Ref Range Status   MRSA by PCR NEGATIVE NEGATIVE Final    Comment:        The GeneXpert MRSA Assay (FDA approved for NASAL specimens only), is one component of a comprehensive MRSA colonization surveillance program. It is not intended to diagnose MRSA infection nor to guide or monitor treatment for MRSA infections. Performed at [facility], [address], [place], [place] [address]     RADIOLOGY:  No results found.  Follow up with PCP in 1 week.  Management plans discussed with the patient, family and they are in agreement.  CODE STATUS: DNR    Code Status Orders  (From admission, onward)         Start     Ordered   02/01/18 2108  Do not attempt resuscitation (DNR)  Continuous    Question Answer Comment  In the event of cardiac or respiratory ARREST Do not call a "code blue"   In the event of cardiac or respiratory ARREST Do not perform Intubation, CPR, defibrillation or ACLS   In the event of cardiac or respiratory ARREST Use medication by any route, position, wound care, and other measures to relive pain and suffering.  May use oxygen, suction and manual treatment of airway obstruction as needed for comfort.      02/01/18 2107        Code Status History    Date Active Date Inactive Code Status Order ID Comments User Context   09/17/2017 2300 09/19/2017 1904 DNR [id number]  Cammy Copa, MD Inpatient   08/07/2017 1553 08/08/2017 1938 DNR [id number]  Milagros Loll, MD ED   08/07/2017 1528 08/07/2017 1553 DNR [id number]  Willy Eddy, MD ED    Advance Directive Documentation     Most Recent Value  Type of Advance Directive  Healthcare Power of Attorney, Living will  Pre-existing out of facility DNR order (yellow form or pink MOST form)  -  "MOST" Form in Place?  -      TOTAL TIME TAKING CARE OF THIS PATIENT ON DAY OF DISCHARGE: more than 34 minutes.   Ihor Austin M.D on 02/03/2018 at 1:05 PM  Between 7am to 6pm - Pager - [phone]  After 6pm go to [email or web address] - password [email or web address]  [facility]  Office  [phone]  CC: Primary care physician; Dorothey Baseman, MD  Note: This dictation was prepared with Dragon dictation along with smaller phrase technology. Any transcriptional errors that result from this process are unintentional.

## 2018-02-03 NOTE — Evaluation (Signed)
Physical Therapy Evaluation Patient Details Name: Megan PillowRuth M Cales MRN: [id number] DOB: 1927/03/10 Today's Date: 02/03/2018   History of Present Illness  84 yo female with onset of AMS and dementia pre-existing was admitted with hematemesis with clear EGD.  PT requested to clear her for return to ALF.  PMHx:  GERD, PUD, HTN, PE, CHF, osteoporosis, GI bleed, seizures  Clinical Impression  Pt was seen for mobility check with significant effort to get OOB but was independent with the transition.  Using RW with pt reporting rollator at baseline, and is able to take short walks with no LOB or signs that she might lose her balance.  Follow acutely for progression of balance work, to increase gait endurance and monitor her for safety as needed.  HHPT to resume at ALF as before her admission.    Follow Up Recommendations Home health PT;Supervision - Intermittent    Equipment Recommendations  None recommended by PT    Recommendations for Other Services       Precautions / Restrictions Precautions Precautions: Fall Precaution Comments: reports she is motivated to get up and finish with walking Restrictions Weight Bearing Restrictions: No      Mobility  Bed Mobility Overal bed mobility: Modified Independent                Transfers Overall transfer level: Modified independent Equipment used: Rolling walker (2 wheeled)                Ambulation/Gait Ambulation/Gait assistance: Supervision Gait Distance (Feet): 80 Feet(40 x 2) Assistive device: Rolling walker (2 wheeled) Gait Pattern/deviations: Step-to pattern;Step-through pattern;Decreased stride length;Narrow base of support Gait velocity: reduced Gait velocity interpretation: <1.31 ft/sec, indicative of household ambulator General Gait Details: slow to turn walker but can control with no LOB  Stairs            Wheelchair Mobility    Modified Rankin (Stroke Patients Only)       Balance Overall balance  assessment: Needs assistance Sitting-balance support: Feet supported Sitting balance-Leahy Scale: Good     Standing balance support: Bilateral upper extremity supported;During functional activity Standing balance-Leahy Scale: Fair Standing balance comment: less than fair dynamic balance                             Pertinent Vitals/Pain Pain Assessment: No/denies pain    Home Living Family/patient expects to be discharged to:: Assisted living               Home Equipment: Walker - 4 wheels Additional Comments: uses  4ww for ad lib AMB in facility.     Prior Function Level of Independence: Independent with assistive device(s)         Comments: did not require assistance to do bathing or dressing     Hand Dominance   Dominant Hand: Right    Extremity/Trunk Assessment   Upper Extremity Assessment Upper Extremity Assessment: Overall WFL for tasks assessed    Lower Extremity Assessment Lower Extremity Assessment: Overall WFL for tasks assessed    Cervical / Trunk Assessment Cervical / Trunk Assessment: Kyphotic  Communication   Communication: No difficulties  Cognition Arousal/Alertness: Awake/alert Behavior During Therapy: WFL for tasks assessed/performed Overall Cognitive Status: Within Functional Limits for tasks assessed  General Comments General comments (skin integrity, edema, etc.): walks and transfers with no safety cues    Exercises     Assessment/Plan    PT Assessment Patient needs continued PT services  PT Problem List Decreased strength;Decreased range of motion;Decreased activity tolerance;Decreased balance;Decreased mobility;Decreased coordination;Decreased knowledge of use of DME;Decreased safety awareness;Obesity       PT Treatment Interventions DME instruction;Gait training;Functional mobility training;Therapeutic activities;Therapeutic exercise;Balance  training;Neuromuscular re-education;Patient/family education    PT Goals (Current goals can be found in the Care Plan section)  Acute Rehab PT Goals Patient Stated Goal: to get home and resume therapy PT Goal Formulation: With patient Time For Goal Achievement: 02/17/18 Potential to Achieve Goals: Good    Frequency Min 2X/week   Barriers to discharge   home in ALF with HHPT initiated before admission    Co-evaluation               AM-PAC PT "6 Clicks" Mobility  Outcome Measure Help needed turning from your back to your side while in a flat bed without using bedrails?: None Help needed moving from lying on your back to sitting on the side of a flat bed without using bedrails?: A Little Help needed moving to and from a bed to a chair (including a wheelchair)?: A Little Help needed standing up from a chair using your arms (e.g., wheelchair or bedside chair)?: A Little Help needed to walk in hospital room?: A Little Help needed climbing 3-5 steps with a railing? : A Little 6 Click Score: 19    End of Session Equipment Utilized During Treatment: Gait belt Activity Tolerance: Patient tolerated treatment well Patient left: in bed;with call bell/phone within reach;with bed alarm set Nurse Communication: Mobility status PT Visit Diagnosis: Unsteadiness on feet (R26.81);Difficulty in walking, not elsewhere classified (R26.2)    Time: 9629-52841306-1333 PT Time Calculation (min) (ACUTE ONLY): 27 min   Charges:   PT Evaluation $PT Eval Moderate Complexity: 1 Mod PT Treatments $Gait Training: 8-22 mins       Ivar DrapeRuth E Mckay Tegtmeyer 02/03/2018, 2:43 PM   Samul Dadauth Braeden Kennan, PT MS Acute Rehab Dept. Number: [facility] [phone] and [facility] [phone]

## 2018-02-03 NOTE — [place] FL2 (Addendum)
[place] MEDICAID FL2 LEVEL OF CARE SCREENING TOOL     IDENTIFICATION  Patient Name: Megan PillowRuth M Pettie Birthdate: 1927/04/05 Sex: female Admission Date (Current Location): 02/01/2018  [facility] [place]   Facility and Address:  [facility], [address], [place], [place] [address]      Provider Number: [id number]  Attending Physician Name and Address:  Ihor AustinPyreddy, Pavan, MD  Relative Name and Phone Number:       Current Level of Care: Hospital Recommended Level of Care: Assisted Living Facility Prior Approval Number:    Date Approved/Denied:   PASRR Number:    Discharge Plan: (ALF)    Current Diagnoses: Patient Active Problem List   Diagnosis Date Noted  . GI bleed 02/01/2018  . Altered mental status 09/17/2017  . Seizure (HCC) 08/07/2017    Orientation RESPIRATION BLADDER Height & Weight     Self, Place, Situation  (P) Normal Incontinent Weight: 195 lb 15.8 oz (88.9 kg) Height:  5\' 3"  (160 cm)  BEHAVIORAL SYMPTOMS/MOOD NEUROLOGICAL BOWEL NUTRITION STATUS  (none) (none) Continent (P) Diet(soft)  AMBULATORY STATUS COMMUNICATION OF NEEDS Skin   Limited Assist Verbally Normal                       Personal Care Assistance Level of Assistance  Bathing, Dressing, Feeding Bathing Assistance: Limited assistance Feeding assistance: Limited assistance Dressing Assistance: Limited assistance     Functional Limitations Info  (none reported)          SPECIAL CARE FACTORS FREQUENCY                       Contractures Contractures Info: (P) Not present    Additional Factors Info  (P) Code Status   Home Health Code Status Info: (P) dnr               Medication List    STOP taking these medications   aspirin EC 81 MG tablet     TAKE these medications   acetaminophen 500 MG tablet Commonly known as:  TYLENOL Take 1,000 mg by mouth 2 (two) times daily.   CALCIUM 600-D 600-400 MG-UNIT  Tabs Generic drug:  Calcium Carbonate-Vitamin D3 Take 1 tablet by mouth 2 (two) times daily.   escitalopram 10 MG tablet Commonly known as:  LEXAPRO Take 10 mg by mouth daily.   lamoTRIgine 25 MG tablet Commonly known as:  LAMICTAL Take 50 mg by mouth 2 (two) times daily.   levothyroxine 75 MCG tablet Commonly known as:  SYNTHROID, LEVOTHROID Take 75 mcg by mouth daily.   metoprolol succinate 25 MG 24 hr tablet Commonly known as:  TOPROL-XL Take 1 tablet (25 mg total) by mouth daily.   pantoprazole 40 MG tablet Commonly known as:  PROTONIX Take 1 tablet (40 mg total) by mouth 2 (two) times daily. What changed:  when to take this   PEG 3350 Powd Take 17 g by mouth daily as needed.   risedronate 35 MG tablet Commonly known as:  ACTONEL Take 35 mg by mouth every 7 (seven) days. Take with full glass of water. Do not lie down for the next 30 minutes.   simvastatin 40 MG tablet Commonly known as:  ZOCOR Take 40 mg by mouth at bedtime.   traZODone 50 MG tablet Commonly known as:  DESYREL Take 1 tablet (50 mg total) by mouth at bedtime.      Additional Information  York SpanielMonica Shatina Streets, [place]

## 2018-02-03 NOTE — Clinical Social Work Note (Signed)
[facility] and has been sent discharge information. Patient's daughter arriving to transport patient back. York SpanielMonica Amoni Scallan MSW,LCSW [phone]

## 2018-03-10 DIAGNOSIS — K250 Acute gastric ulcer with hemorrhage: Secondary | ICD-10-CM | POA: Insufficient documentation

## 2018-03-16 ENCOUNTER — Emergency Department
Admission: EM | Admit: 2018-03-16 | Discharge: 2018-03-16 | Disposition: A | Discharge status: Home / Self Care | Payer: Medicare Other | Attending: Emergency Medicine | Admitting: Emergency Medicine

## 2018-03-16 ENCOUNTER — Encounter: Payer: Self-pay | Admitting: Emergency Medicine

## 2018-03-16 ENCOUNTER — Other Ambulatory Visit: Payer: Self-pay

## 2018-03-16 DIAGNOSIS — I110 Hypertensive heart disease with heart failure: Secondary | ICD-10-CM | POA: Insufficient documentation

## 2018-03-16 DIAGNOSIS — K5641 Fecal impaction: Secondary | ICD-10-CM | POA: Diagnosis not present

## 2018-03-16 DIAGNOSIS — I509 Heart failure, unspecified: Secondary | ICD-10-CM | POA: Insufficient documentation

## 2018-03-16 DIAGNOSIS — Z79899 Other long term (current) drug therapy: Secondary | ICD-10-CM | POA: Diagnosis not present

## 2018-03-16 DIAGNOSIS — K5900 Constipation, unspecified: Secondary | ICD-10-CM

## 2018-03-16 MED ORDER — MICONAZOLE NITRATE 4 % VA CREA: 1.0000 | TOPICAL_CREAM | Freq: Two times a day (BID) | VAGINAL | 0 refills | Status: AC

## 2018-03-16 MED ORDER — FLEET ENEMA 7-19 GM/118ML RE ENEM: 1.0000 | ENEMA | Freq: Every day | RECTAL | 0 refills | Status: AC | PRN

## 2018-03-16 MED ORDER — LIDOCAINE (ANORECTAL) 5 % EX CREA: 3.0000 mL | TOPICAL_CREAM | Freq: Three times a day (TID) | CUTANEOUS | 0 refills | Status: DC | PRN

## 2018-03-16 MED ORDER — MIDAZOLAM HCL 2 MG/2ML IJ SOLN
2.0000 mg | Freq: Once | INTRAMUSCULAR | Status: AC
  Administered 2018-03-16: 2 mg via INTRAVENOUS
  Filled 2018-03-16: qty 2

## 2018-03-16 NOTE — Discharge Instructions (Signed)
Continue the MiraLAX and other medications for constipation.  You can use the enema over the next few days, the lidocaine to help with rectal pain, and should apply the miconazole cream for the fungal infection.  Return to the ER for new or worsening constipation, abdominal pain, vomiting, or any other new or worsening symptoms that concern you.

## 2018-03-16 NOTE — ED Notes (Signed)
MD at bedside. 

## 2018-03-16 NOTE — ED Provider Notes (Signed)
[facility] Emergency Department Provider Note ____________________________________________   First MD Initiated Contact with Patient 03/16/18 1615     (approximate)  I have reviewed the triage vital signs and the nursing notes.   HISTORY  Chief Complaint Constipation  Level 5 caveat: History of present illness limited due to dementia  HPI Megan Donaldson is a 85 y.o. female PMH as noted below who presents with constipation, worse over the last few weeks.  She states her last bowel movement was about a week ago.  She feels pressure in her rectum and her daughter believes that she is impacted.  The patient denies any abdominal pain, nausea, or vomiting.  She has had no relief with stool softeners or laxatives.  Past Medical History:  Diagnosis Date  . CHF (congestive heart failure) (HCC)   . Dementia (HCC)   . GERD (gastroesophageal reflux disease)   . Hyperlipidemia   . Hypertension   . Osteoporosis   . Personal history of other venous thrombosis and embolism   . Personal history of PE (pulmonary embolism)     Patient Active Problem List   Diagnosis Date Noted  . GI bleed 02/01/2018  . Altered mental status 09/17/2017  . Seizure (HCC) 08/07/2017    Past Surgical History:  Procedure Laterality Date  . ESOPHAGOGASTRODUODENOSCOPY N/A 02/02/2018   Procedure: ESOPHAGOGASTRODUODENOSCOPY (EGD);  Surgeon: Toledo, Boykin Nearing, MD;  Location: [facility] ENDOSCOPY;  Service: Gastroenterology;  Laterality: N/A;  . HIP CLOSED REDUCTION Right 12/21/2014   Procedure: CLOSED REDUCTION HIP;  Surgeon: Deeann Saint, MD;  Location: [facility] ORS;  Service: Orthopedics;  Laterality: Right;  . TOTAL HIP ARTHROPLASTY Right    x3    Prior to Admission medications   Medication Sig Start Date End Date Taking? Authorizing Provider  acetaminophen (TYLENOL) 500 MG tablet Take 1,000 mg by mouth 2 (two) times daily.     [provider]  Calcium Carbonate-Vitamin D3 (CALCIUM  600-D) 600-400 MG-UNIT TABS Take 1 tablet by mouth 2 (two) times daily.    [provider]  escitalopram (LEXAPRO) 10 MG tablet Take 10 mg by mouth daily. 07/21/17   [provider]  lamoTRIgine (LAMICTAL) 25 MG tablet Take 50 mg by mouth 2 (two) times daily. 01/17/18 01/17/19  [provider]  levothyroxine (SYNTHROID, LEVOTHROID) 75 MCG tablet Take 75 mcg by mouth daily.    [provider]  Lidocaine, Anorectal, 5 % CREA Apply 3 mLs topically 3 (three) times daily as needed (rectal pain). 03/16/18   Dionne Bucy, MD  metoprolol succinate (TOPROL-XL) 25 MG 24 hr tablet Take 1 tablet (25 mg total) by mouth daily. 09/20/17   Alford Highland, MD  MICONAZOLE NITRATE VAGINAL (MICONAZOLE 3) 4 % CREA Place 1 Applicatorful vaginally 2 (two) times daily for 7 days. 03/16/18 03/23/18  Dionne Bucy, MD  pantoprazole (PROTONIX) 40 MG tablet Take 1 tablet (40 mg total) by mouth 2 (two) times daily. 02/03/18 03/05/18  Ihor Austin, MD  Polyethylene Glycol 3350 (PEG 3350) POWD Take 17 g by mouth daily as needed. 08/17/17   [provider]  risedronate (ACTONEL) 35 MG tablet Take 35 mg by mouth every 7 (seven) days. Take with full glass of water. Do not lie down for the next 30 minutes. 07/15/17   [provider]  simvastatin (ZOCOR) 40 MG tablet Take 40 mg by mouth at bedtime. 05/04/17   [provider]  sodium phosphate (FLEET) 7-19 GM/118ML ENEM Place 133 mLs (1 enema total) rectally  daily as needed for up to 3 days for severe constipation. 03/16/18 03/19/18  Dionne BucySiadecki, Sani Madariaga, MD  traZODone (DESYREL) 50 MG tablet Take 1 tablet (50 mg total) by mouth at bedtime. 09/19/17   Alford HighlandWieting, Richard, MD    Allergies Amoxicillin; Latex; and Lisinopril  Family History  Problem Relation Age of Onset  . CAD Father     Social History Social History   Tobacco Use  . Smoking status: Never Smoker  . Smokeless tobacco: Never Used  Substance Use Topics  .  Alcohol use: No  . Drug use: No    Review of Systems Level 5 caveat: Review of systems limited due to dementia Constitutional: No fever Respiratory: Denies shortness of breath. Gastrointestinal: No vomiting. Genitourinary: Negative for dysuria.  Musculoskeletal: Negative for back pain. Skin: Negative for rash.   ____________________________________________   PHYSICAL EXAM:  VITAL SIGNS: ED Triage Vitals  Enc Vitals Group     BP 03/16/18 1608 (!) 147/75     Pulse Rate 03/16/18 1608 61     Resp 03/16/18 1608 18     Temp 03/16/18 1608 (!) 97.4 F (36.3 C)     Temp src --      SpO2 03/16/18 1608 99 %     Weight 03/16/18 1611 190 lb (86.2 kg)     Height 03/16/18 1611 5\' 2"  (1.575 m)     Head Circumference --      Peak Flow --      Pain Score 03/16/18 1611 0     Pain Loc --      Pain Edu? --      Excl. in GC? --     Constitutional: Alert and oriented. Well appearing for age and in no acute distress. Eyes: Conjunctivae are normal.  Head: Atraumatic. Nose: No congestion/rhinnorhea. Mouth/Throat: Mucous membranes are moist.   Neck: Normal range of motion.  Cardiovascular: Good peripheral circulation. Respiratory: Normal respiratory effort.  Gastrointestinal: Soft and nontender. No distention.  Hard mass of impacted stool on DRE. Genitourinary: No flank tenderness. Musculoskeletal:  Extremities warm and well perfused.  Neurologic:  Normal speech and language. No gross focal neurologic deficits are appreciated.  Skin:  Skin is warm and dry. No rash noted. Psychiatric: Mood and affect are normal. Speech and behavior are normal.  ____________________________________________   LABS (all labs ordered are listed, but only abnormal results are displayed)  Labs Reviewed - No data to  display ____________________________________________  EKG   ____________________________________________  RADIOLOGY    ____________________________________________   PROCEDURES  Procedure(s) performed: Yes  Fecal disimpaction Date/Time: 03/17/2018 12:08 AM Performed by: Dionne BucySiadecki, Chyrel Taha, MD Authorized by: Dionne BucySiadecki, Chemeka Filice, MD  Consent: Verbal consent obtained. Risks and benefits: risks, benefits and alternatives were discussed Patient understanding: patient states understanding of the procedure being performed Patient identity confirmed: verbally with patient and arm band Local anesthesia used: no  Anesthesia: Local anesthesia used: no  Sedation: Patient sedated: yes Sedation type: anxiolysis Sedatives: midazolam  Patient tolerance: Patient tolerated the procedure well with no immediate complications     Critical Care performed: No ____________________________________________   INITIAL IMPRESSION / ASSESSMENT AND PLAN / ED COURSE  Pertinent labs & imaging results that were available during my care of the patient were reviewed by me and considered in my medical decision making (see chart for details).  85 year old female with PMH as noted above presents with constipation, with last bowel movement approximately a week ago.  The patient reports rectal pressure, but denies any bleeding, or any  abdominal pain or vomiting.  On exam the patient is well-appearing and her vital signs are normal.  The abdomen is soft and nontender.  There is no distention.  On DRE there is significant impacted stool.  We will perform a disimpaction.  ----------------------------------------- 10:00 PM on 03/16/2018 -----------------------------------------  Patient successfully disimpacted and tolerated the procedure without complications.  She does have a hemorrhoid which is not bleeding, but I think pain from the hemorrhoid is contributing to her reluctance to strain or push  stool out.  I have prescribed an enema, lidocaine topical cream for the hemorrhoid, and miconazole for a yeast infection that was discovered when the RN was changing the patient's diaper.  Return precautions given, and the patient's daughter expressed understanding.  The patient's mental status returned to her baseline after Versed.  She was stable for discharge home.  ____________________________________________   FINAL CLINICAL IMPRESSION(S) / ED DIAGNOSES  Final diagnoses:  Constipation, unspecified constipation type  Fecal impaction in rectum (HCC)      NEW MEDICATIONS STARTED DURING THIS VISIT:  Discharge Medication List as of 03/16/2018  7:17 PM    START taking these medications   Details  Lidocaine, Anorectal, 5 % CREA Apply 3 mLs topically 3 (three) times daily as needed (rectal pain)., Starting Wed 03/16/2018, Print    MICONAZOLE NITRATE VAGINAL (MICONAZOLE 3) 4 % CREA Place 1 Applicatorful vaginally 2 (two) times daily for 7 days., Starting Wed 03/16/2018, Until Wed 03/23/2018, Print    sodium phosphate (FLEET) 7-19 GM/118ML ENEM Place 133 mLs (1 enema total) rectally daily as needed for up to 3 days for severe constipation., Starting Wed 03/16/2018, Until Sat 03/19/2018, Print         Note:  This document was prepared using Dragon voice recognition software and may include unintentional dictation errors.    Dionne Bucy, MD 03/17/18 [phone]

## 2018-03-16 NOTE — ED Notes (Signed)
MD at bedside for disimpaction

## 2018-03-16 NOTE — ED Triage Notes (Signed)
Pt reports unknown last bowel movement. Concerns addressed over possible fecal impaction after assessment from family yesterday. OTC medication used with no relief.

## 2018-08-31 ENCOUNTER — Other Ambulatory Visit: Payer: Self-pay

## 2018-08-31 ENCOUNTER — Encounter: Payer: Self-pay | Admitting: Emergency Medicine

## 2018-08-31 ENCOUNTER — Observation Stay
Admission: EM | Admit: 2018-08-31 | Discharge: 2018-09-01 | Disposition: A | Discharge status: Skilled Nursing Facility | Payer: Medicare Other | Attending: Orthopedic Surgery | Admitting: Orthopedic Surgery

## 2018-08-31 ENCOUNTER — Emergency Department: Payer: Medicare Other

## 2018-08-31 DIAGNOSIS — S73004A Unspecified dislocation of right hip, initial encounter: Secondary | ICD-10-CM | POA: Diagnosis present

## 2018-08-31 DIAGNOSIS — D649 Anemia, unspecified: Secondary | ICD-10-CM | POA: Diagnosis not present

## 2018-08-31 DIAGNOSIS — Z86711 Personal history of pulmonary embolism: Secondary | ICD-10-CM | POA: Diagnosis not present

## 2018-08-31 DIAGNOSIS — K219 Gastro-esophageal reflux disease without esophagitis: Secondary | ICD-10-CM | POA: Diagnosis not present

## 2018-08-31 DIAGNOSIS — E785 Hyperlipidemia, unspecified: Secondary | ICD-10-CM | POA: Diagnosis not present

## 2018-08-31 DIAGNOSIS — I509 Heart failure, unspecified: Secondary | ICD-10-CM | POA: Insufficient documentation

## 2018-08-31 DIAGNOSIS — Z7982 Long term (current) use of aspirin: Secondary | ICD-10-CM | POA: Diagnosis not present

## 2018-08-31 DIAGNOSIS — Z79899 Other long term (current) drug therapy: Secondary | ICD-10-CM | POA: Insufficient documentation

## 2018-08-31 DIAGNOSIS — Z880 Allergy status to penicillin: Secondary | ICD-10-CM | POA: Insufficient documentation

## 2018-08-31 DIAGNOSIS — Y792 Prosthetic and other implants, materials and accessory orthopedic devices associated with adverse incidents: Secondary | ICD-10-CM | POA: Insufficient documentation

## 2018-08-31 DIAGNOSIS — F329 Major depressive disorder, single episode, unspecified: Secondary | ICD-10-CM | POA: Insufficient documentation

## 2018-08-31 DIAGNOSIS — E039 Hypothyroidism, unspecified: Secondary | ICD-10-CM | POA: Insufficient documentation

## 2018-08-31 DIAGNOSIS — Z8249 Family history of ischemic heart disease and other diseases of the circulatory system: Secondary | ICD-10-CM | POA: Diagnosis not present

## 2018-08-31 DIAGNOSIS — M810 Age-related osteoporosis without current pathological fracture: Secondary | ICD-10-CM | POA: Diagnosis not present

## 2018-08-31 DIAGNOSIS — Z1159 Encounter for screening for other viral diseases: Secondary | ICD-10-CM | POA: Diagnosis not present

## 2018-08-31 DIAGNOSIS — T84020A Dislocation of internal right hip prosthesis, initial encounter: Principal | ICD-10-CM | POA: Insufficient documentation

## 2018-08-31 DIAGNOSIS — Z888 Allergy status to other drugs, medicaments and biological substances status: Secondary | ICD-10-CM | POA: Insufficient documentation

## 2018-08-31 DIAGNOSIS — F0390 Unspecified dementia without behavioral disturbance: Secondary | ICD-10-CM | POA: Insufficient documentation

## 2018-08-31 DIAGNOSIS — R569 Unspecified convulsions: Secondary | ICD-10-CM | POA: Diagnosis not present

## 2018-08-31 DIAGNOSIS — Z86718 Personal history of other venous thrombosis and embolism: Secondary | ICD-10-CM | POA: Diagnosis not present

## 2018-08-31 DIAGNOSIS — F419 Anxiety disorder, unspecified: Secondary | ICD-10-CM | POA: Diagnosis not present

## 2018-08-31 DIAGNOSIS — I110 Hypertensive heart disease with heart failure: Secondary | ICD-10-CM | POA: Diagnosis not present

## 2018-08-31 DIAGNOSIS — Z9889 Other specified postprocedural states: Secondary | ICD-10-CM | POA: Diagnosis present

## 2018-08-31 LAB — CBC WITH DIFFERENTIAL/PLATELET
Abs Immature Granulocytes: 0.02 10*3/uL (ref 0.00–0.07)
Basophils Absolute: 0.1 10*3/uL (ref 0.0–0.1)
Basophils Relative: 1 %
Eosinophils Absolute: 0.1 10*3/uL (ref 0.0–0.5)
Eosinophils Relative: 2 %
HCT: 27.1 % — ABNORMAL LOW (ref 36.0–46.0)
Hemoglobin: 8.3 g/dL — ABNORMAL LOW (ref 12.0–15.0)
Immature Granulocytes: 0 %
Lymphocytes Relative: 29 %
Lymphs Abs: 1.8 10*3/uL (ref 0.7–4.0)
MCH: 24.6 pg — ABNORMAL LOW (ref 26.0–34.0)
MCHC: 30.6 g/dL (ref 30.0–36.0)
MCV: 80.2 fL (ref 80.0–100.0)
Monocytes Absolute: 0.6 10*3/uL (ref 0.1–1.0)
Monocytes Relative: 10 %
Neutro Abs: 3.6 10*3/uL (ref 1.7–7.7)
Neutrophils Relative %: 58 %
Platelets: 341 10*3/uL (ref 150–400)
RBC: 3.38 MIL/uL — ABNORMAL LOW (ref 3.87–5.11)
RDW: 22.2 % — ABNORMAL HIGH (ref 11.5–15.5)
WBC: 6.2 10*3/uL (ref 4.0–10.5)
nRBC: 0 % (ref 0.0–0.2)

## 2018-08-31 LAB — BASIC METABOLIC PANEL
Anion gap: 10 (ref 5–15)
BUN: 29 mg/dL — ABNORMAL HIGH (ref 8–23)
CO2: 24 mmol/L (ref 22–32)
Calcium: 9.4 mg/dL (ref 8.9–10.3)
Chloride: 107 mmol/L (ref 98–111)
Creatinine, Ser: 1.23 mg/dL — ABNORMAL HIGH (ref 0.44–1.00)
GFR calc Af Amer: 45 mL/min — ABNORMAL LOW (ref >60)
GFR calc non Af Amer: 39 mL/min — ABNORMAL LOW (ref >60)
Glucose, Bld: 106 mg/dL — ABNORMAL HIGH (ref 70–99)
Potassium: 4.1 mmol/L (ref 3.5–5.1)
Sodium: 141 mmol/L (ref 135–145)

## 2018-08-31 MED ORDER — MIDAZOLAM HCL 2 MG/2ML IJ SOLN
1.0000 mg | Freq: Once | INTRAMUSCULAR | Status: DC
  Filled 2018-08-31: qty 2

## 2018-08-31 MED ORDER — FENTANYL CITRATE (PF) 100 MCG/2ML IJ SOLN
50.0000 ug | Freq: Once | INTRAMUSCULAR | Status: AC
  Administered 2018-08-31: 50 ug via INTRAVENOUS
  Filled 2018-08-31: qty 2

## 2018-08-31 MED ORDER — PROPOFOL 10 MG/ML IV BOLUS
INTRAVENOUS | Status: AC
  Filled 2018-08-31: qty 20

## 2018-08-31 MED ORDER — ONDANSETRON HCL 4 MG/2ML IJ SOLN
4.0000 mg | Freq: Once | INTRAMUSCULAR | Status: DC
  Filled 2018-08-31: qty 2

## 2018-08-31 MED ORDER — FENTANYL CITRATE (PF) 100 MCG/2ML IJ SOLN
INTRAMUSCULAR | Status: AC
  Filled 2018-08-31: qty 2

## 2018-08-31 MED ORDER — ETOMIDATE 2 MG/ML IV SOLN
20.0000 mg | Freq: Once | INTRAVENOUS | Status: DC
  Filled 2018-08-31: qty 10

## 2018-08-31 NOTE — ED Notes (Signed)
Fall risk bracelet applied, yellow socks to be placed. Fall mats at bedside, patient instructed to use call bell if assistance is needed

## 2018-08-31 NOTE — ED Notes (Signed)
Unable to obtain patient's oral and axillary temps, core temp 93.7. Patient states usual temp is 97. MD notified, warm blankets applied. Bair hugger obtained, placed on patient at 2320

## 2018-08-31 NOTE — ED Notes (Signed)
Patient transported to X-ray 

## 2018-08-31 NOTE — Consult Note (Signed)
ORTHOPAEDIC CONSULTATION  PATIENT NAME: Megan Donaldson DOB: 1927-12-12  MRN: [id number]  REQUESTING PHYSICIAN: Carrie Mew, MD  Chief Complaint: Right hip pain  HPI: Megan Donaldson is a 85 y.o. female who complains of right hip pain. She apparently bent over to put on her shoe and felt the hip "pop". 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 She denied any other injuries. She denied any loss of consciousness.  Past Medical History:  Diagnosis Date  . CHF (congestive heart failure) ([place])   . Dementia ([place])   . GERD (gastroesophageal reflux disease)   . Hyperlipidemia   . Hypertension   . Osteoporosis   . Personal history of other venous thrombosis and embolism   . Personal history of PE (pulmonary embolism)    Past Surgical History:  Procedure Laterality Date  . ESOPHAGOGASTRODUODENOSCOPY N/A 02/02/2018   Procedure: ESOPHAGOGASTRODUODENOSCOPY (EGD);  Surgeon: Toledo, Benay Pike, MD;  Location: [facility] ENDOSCOPY;  Service: Gastroenterology;  Laterality: N/A;  . HIP CLOSED REDUCTION Right 12/21/2014   Procedure: CLOSED REDUCTION HIP;  Surgeon: Earnestine Leys, MD;  Location: [facility] ORS;  Service: Orthopedics;  Laterality: Right;  . TOTAL HIP ARTHROPLASTY Right    x3   Social History   Socioeconomic History  . Marital status: Widowed    Spouse name: Not on file  . Number of children: Not on file  . Years of education: Not on file  . Highest education level: Not on file  Occupational History  . Occupation: retired  Scientific laboratory technician  . Financial resource strain: Not on file  . Food insecurity    Worry: Not on file    Inability: Not on file  . Transportation needs    Medical: Not on file    Non-medical: Not on file  Tobacco Use  . Smoking status: Never Smoker  . Smokeless tobacco: Never Used  Substance and Sexual Activity  . Alcohol use: No  . Drug use: No  . Sexual activity: Not Currently  Lifestyle  . Physical activity    Days per week: Not on  file    Minutes per session: Not on file  . Stress: Not on file  Relationships  . Social Herbalist on phone: Not on file    Gets together: Not on file    Attends religious service: Not on file    Active member of club or organization: Not on file    Attends meetings of clubs or organizations: Not on file    Relationship status: Not on file  Other Topics Concern  . Not on file  Social History Narrative  . Not on file   Family History  Problem Relation Age of Onset  . CAD Father    Allergies  Allergen Reactions  . Amoxicillin     Has patient had a PCN reaction causing immediate rash, facial/tongue/throat swelling, SOB or lightheadedness with hypotension: Unknown Has patient had a PCN reaction causing severe rash involving mucus membranes or skin necrosis: Unknown Has patient had a PCN reaction that required hospitalization: Unknown Has patient had a PCN reaction occurring within the last 10 years: Unknown If all of the above answers are "NO", then may proceed with Cephalosporin use.   . Latex   . Lisinopril    Prior to Admission medications   Medication Sig Start Date End Date Taking? Authorizing Provider  acetaminophen (TYLENOL) 500 MG tablet Take 1,000 mg by mouth 2 (two) times daily.  [provider]  Calcium Carbonate-Vitamin D3 (CALCIUM 600-D) 600-400 MG-UNIT TABS Take 1 tablet by mouth 2 (two) times daily.    [provider]  escitalopram (LEXAPRO) 10 MG tablet Take 10 mg by mouth daily. 07/21/17   [provider]  lamoTRIgine (LAMICTAL) 25 MG tablet Take 50 mg by mouth 2 (two) times daily. 01/17/18 01/17/19  [provider]  levothyroxine (SYNTHROID, LEVOTHROID) 75 MCG tablet Take 75 mcg by mouth daily.    [provider]  Lidocaine, Anorectal, 5 % CREA Apply 3 mLs topically 3 (three) times daily as needed (rectal pain). 03/16/18   Dionne BucySiadecki, Sebastian, MD  metoprolol succinate (TOPROL-XL) 25 MG 24 hr tablet Take 1  tablet (25 mg total) by mouth daily. 09/20/17   Alford HighlandWieting, Richard, MD  pantoprazole (PROTONIX) 40 MG tablet Take 1 tablet (40 mg total) by mouth 2 (two) times daily. 02/03/18 03/05/18  Ihor AustinPyreddy, Pavan, MD  Polyethylene Glycol 3350 (PEG 3350) POWD Take 17 g by mouth daily as needed. 08/17/17   [provider]  risedronate (ACTONEL) 35 MG tablet Take 35 mg by mouth every 7 (seven) days. Take with full glass of water. Do not lie down for the next 30 minutes. 07/15/17   [provider]  simvastatin (ZOCOR) 40 MG tablet Take 40 mg by mouth at bedtime. 05/04/17   [provider]  traZODone (DESYREL) 50 MG tablet Take 1 tablet (50 mg total) by mouth at bedtime. 09/19/17   Alford HighlandWieting, Richard, MD   Dg Hip Unilat W Or Wo Pelvis 2-3 Views Right  Result Date: 08/31/2018 CLINICAL DATA:  Hip pain EXAM: DG HIP (WITH OR WITHOUT PELVIS) 2-3V RIGHT COMPARISON:  12/21/2014 FINDINGS: Pubic symphysis and rami are intact. Status post bilateral hip replacements. Normal alignment on the left. Superior and posterior dislocation of the right femoral component with respect to the acetabular cup. No fracture IMPRESSION: Status post bilateral hip replacements. Superior and posterior dislocation of the right femoral component with respect to the acetabular cup Electronically Signed   By: Jasmine PangKim  Fujinaga M.D.   On: 08/31/2018 22:15    Positive ROS: All other systems have been reviewed and were otherwise negative with the exception of those mentioned in the HPI and as above.  Physical Exam: General: Well developed, well nourished female seen in no acute distress. HEENT: Atraumatic and normocephalic. Sclera are clear. Extraocular motion is intact. Oropharynx is clear with moist mucosa. Neck: Supple, nontender, good range of motion. No JVD or carotid bruits. Lungs: Clear to auscultation bilaterally. Cardiovascular: Regular rate and rhythm with normal S1 and S2. No murmurs. No gallops or rubs. Pedal pulses are palpable  bilaterally. Homans test is negative bilaterally. No significant pretibial or ankle edema. Abdomen: Soft, nontender, and nondistended. Bowel sounds are present. Skin: No lesions in the area of chief complaint Neurologic: Awake, alert, and oriented. Sensory function is grossly intact. Motor strength is felt to be 5 over 5 bilaterally. No clonus or tremor. Good motor coordination. Lymphatic: No axillary or cervical lymphadenopathy  MUSCULOSKELETAL: The right lower extremity is shortened and rotated. Pain is elicited with attempted range of motion of the right hip.  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 Recommendation was made for closed reduction under anesthesia. The usual perioperative course was discussed. The risks and benefits of surgical intervention were reviewed. The patient expressed understanding of the risks and benefits and agreed with plans for surgical intervention.  The surgical site was signed as per the "right site surgery" protocol.   James P. Holley Bouche M.D.

## 2018-08-31 NOTE — ED Provider Notes (Addendum)
[facility] Emergency Department Provider Note  ____________________________________________  Time seen: Approximately 10:57 PM  I have reviewed the triage vital signs and the nursing notes.   HISTORY  Chief Complaint Hip Injury    HPI Megan Donaldson is a 85 y.o. female with a history of CHF, dementia, hypertension who was in her usual state of health, when she was leaning over to put on a shoe and tie her shoe when she felt a sudden pop and pain in her right hip.  Worse with movement, better with remaining still.  Nonradiating.  Moderate to severe.  Constant.  Has a history of right total hip replacement with dislocations, last was in 2016.      Past Medical History:  Diagnosis Date  . CHF (congestive heart failure) ([facility])   . Dementia ([facility])   . GERD (gastroesophageal reflux disease)   . Hyperlipidemia   . Hypertension   . Osteoporosis   . Personal history of other venous thrombosis and embolism   . Personal history of PE (pulmonary embolism)      Patient Active Problem List   Diagnosis Date Noted  . GI bleed 02/01/2018  . Altered mental status 09/17/2017  . Seizure (H[facility]) 08/07/2017     Past Surgical History:  Procedure Laterality Date  . ESOPHAGOGASTRODUODENOSCOPY N/A 02/02/2018   Procedure: ESOPHAGOGASTRODUODENOSCOPY (EGD);  Surgeon: Toledo, Boykin Nearingeodoro K, MD;  Location: [facility] ENDOSCOPY;  Service: Gastroenterology;  Laterality: N/A;  . HIP CLOSED REDUCTION Right 12/21/2014   Procedure: CLOSED REDUCTION HIP;  Surgeon: Deeann SaintHoward Miller, MD;  Location: [facility] ORS;  Service: Orthopedics;  Laterality: Right;  . TOTAL HIP ARTHROPLASTY Right    x3     Prior to Admission medications   Medication Sig Start Date End Date Taking? Authorizing Provider  acetaminophen (TYLENOL) 500 MG tablet Take 1,000 mg by mouth 2 (two) times daily.     [provider]  Calcium Carbonate-Vitamin D3 (CALCIUM 600-D) 600-400 MG-UNIT TABS Take 1 tablet by mouth 2 (two)  times daily.    [provider]  escitalopram (LEXAPRO) 10 MG tablet Take 10 mg by mouth daily. 07/21/17   [provider]  lamoTRIgine (LAMICTAL) 25 MG tablet Take 50 mg by mouth 2 (two) times daily. 01/17/18 01/17/19  [provider]  levothyroxine (SYNTHROID, LEVOTHROID) 75 MCG tablet Take 75 mcg by mouth daily.    [provider]  Lidocaine, Anorectal, 5 % CREA Apply 3 mLs topically 3 (three) times daily as needed (rectal pain). 03/16/18   Dionne BucySiadecki, Sebastian, MD  metoprolol succinate (TOPROL-XL) 25 MG 24 hr tablet Take 1 tablet (25 mg total) by mouth daily. 09/20/17   Alford HighlandWieting, Richard, MD  pantoprazole (PROTONIX) 40 MG tablet Take 1 tablet (40 mg total) by mouth 2 (two) times daily. 02/03/18 03/05/18  Ihor AustinPyreddy, Pavan, MD  Polyethylene Glycol 3350 (PEG 3350) POWD Take 17 g by mouth daily as needed. 08/17/17   [provider]  risedronate (ACTONEL) 35 MG tablet Take 35 mg by mouth every 7 (seven) days. Take with full glass of water. Do not lie down for the next 30 minutes. 07/15/17   [provider]  simvastatin (ZOCOR) 40 MG tablet Take 40 mg by mouth at bedtime. 05/04/17   [provider]  traZODone (DESYREL) 50 MG tablet Take 1 tablet (50 mg total) by mouth at bedtime. 09/19/17   Alford HighlandWieting, Richard, MD     Allergies Amoxicillin, Latex, and Lisinopril   Family History  Problem Relation Age of Onset  .  CAD Father     Social History Social History   Tobacco Use  . Smoking status: Never Smoker  . Smokeless tobacco: Never Used  Substance Use Topics  . Alcohol use: No  . Drug use: No    Review of Systems  Constitutional:   No fever or chills.  ENT:   No sore throat. No rhinorrhea. Cardiovascular:   No chest pain or syncope. Respiratory:   No dyspnea or cough. Gastrointestinal:   Negative for abdominal pain, vomiting and diarrhea.  Musculoskeletal: Right hip pain  all other systems reviewed and are negative except as documented  above in ROS and HPI.  ____________________________________________   PHYSICAL EXAM:  VITAL SIGNS: ED Triage Vitals  Enc Vitals Group     BP 08/31/18 2130 (!) 150/66     Pulse Rate 08/31/18 2130 (!) 54     Resp 08/31/18 2131 15     Temp --      Temp src --      SpO2 08/31/18 2121 98 %     Weight 08/31/18 2134 189 lb (85.7 kg)     Height 08/31/18 2134 5\' 3"  (1.6 m)     Head Circumference --      Peak Flow --      Pain Score 08/31/18 2134 0     Pain Loc --      Pain Edu? --      Excl. in [place]? --     Vital signs reviewed, nursing assessments reviewed.   Constitutional:   Alert and oriented to person and place. Non-toxic appearance. Eyes:   Conjunctivae are normal. EOMI. PERRL. ENT      Head:   Normocephalic and atraumatic.      Nose:   No congestion/rhinnorhea.       Mouth/Throat:   MMM, no pharyngeal erythema. No peritonsillar mass.       Neck:   No meningismus. Full ROM. Hematological/Lymphatic/Immunilogical:   No cervical lymphadenopathy. Cardiovascular:   RRR. Symmetric bilateral radial and DP pulses.  No murmurs. Cap refill less than 2 seconds. Respiratory:   Normal respiratory effort without tachypnea/retractions. Breath sounds are clear and equal bilaterally. No wheezes/rales/rhonchi. Gastrointestinal:   Soft and nontender. Non distended. There is no CVA tenderness.  No rebound, rigidity, or guarding.  Musculoskeletal:   Limited range of motion in the right hip.  Pain with range of motion of the hip.  Bruising posteriorly and laterally at the level of the hip.  Right leg is shortened compared to left.  Intact distal motor function and perfusion. Neurologic:   Normal speech and language.  Motor grossly intact. No acute focal neurologic deficits are appreciated.  Skin:    Skin is warm, dry and intact. No rash noted.  No petechiae, purpura, or bullae.  ____________________________________________    LABS (pertinent positives/negatives) (all labs ordered are listed,  but only abnormal results are displayed) Labs Reviewed  SARS CORONAVIRUS 2 (HOSPITAL ORDER, [facility])   ____________________________________________   EKG    ____________________________________________    RADIOLOGY  Dg Hip Unilat W Or Wo Pelvis 2-3 Views Right  Result Date: 08/31/2018 CLINICAL DATA:  Hip pain EXAM: DG HIP (WITH OR WITHOUT PELVIS) 2-3V RIGHT COMPARISON:  12/21/2014 FINDINGS: Pubic symphysis and rami are intact. Status post bilateral hip replacements. Normal alignment on the left. Superior and posterior dislocation of the right femoral component with respect to the acetabular cup. No fracture IMPRESSION: Status post bilateral hip replacements. Superior and posterior  dislocation of the right femoral component with respect to the acetabular cup Electronically Signed   By: Jasmine PangKim  Fujinaga M.D.   On: 08/31/2018 22:15    ____________________________________________   PROCEDURES Procedures  ____________________________________________    CLINICAL IMPRESSION / ASSESSMENT AND PLAN / ED COURSE  Medications ordered in the ED: Medications  etomidate (AMIDATE) injection 20 mg (has no administration in time range)  midazolam (VERSED) injection 1 mg (has no administration in time range)  ondansetron (ZOFRAN) injection 4 mg (has no administration in time range)  fentaNYL (SUBLIMAZE) injection 50 mcg (50 mcg Intravenous Given 08/31/18 2149)    Pertinent labs & imaging results that were available during my care of the patient were reviewed by me and considered in my medical decision making (see chart for details).  Megan Donaldson was evaluated in Emergency Department on 08/31/2018 for the symptoms described in the history of present illness. She was evaluated in the context of the global COVID-19 pandemic, which necessitated consideration that the patient might be at risk for infection with the SARS-CoV-2 virus that causes COVID-19. Institutional  protocols and algorithms that pertain to the evaluation of patients at risk for COVID-19 are in a state of rapid change based on information released by regulatory bodies including the CDC and federal and state organizations. These policies and algorithms were followed during the patient's care in the ED.   Patient presents with right hip pain sudden onset, suspect hip dislocation versus fracture.  ----------------------------------------- 11:02 PM on 08/31/2018 -----------------------------------------  Confirms dislocation.  Last time this had to be done under general anesthesia in the operating room.  With her dementia and severe comorbidities I think will be safest to proceed with OR management for closed reduction.  I paged orthopedics.    ----------------------------------------- 11:15 PM on 08/31/2018 -----------------------------------------  Discussed with Dr. Ernest PineHooten who will come to do close reduction in the OR.     ____________________________________________   FINAL CLINICAL IMPRESSION(S) / ED DIAGNOSES    Final diagnoses:  Closed dislocation of right hip, initial encounter [facility](HCC)  right hip pain   ED Discharge Orders    None      Portions of this note were generated with dragon dictation software. Dictation errors may occur despite best attempts at proofreading.   Sharman CheekStafford, [place], MD 08/31/18 [id number]    Sharman CheekStafford, Shauntell Iglesia, MD 08/31/18 2315

## 2018-08-31 NOTE — ED Triage Notes (Signed)
Patient arrives from [facility] after she "felt her hip pop out" when she reached down to put on a shoe. Patient did not fall or hit her head. Patient states this has happened before, she has a R hip replacement and this has happened a couple times. R leg is shortened, no pain except with movement

## 2018-09-01 ENCOUNTER — Emergency Department: Payer: Medicare Other | Admitting: Anesthesiology

## 2018-09-01 ENCOUNTER — Emergency Department: Payer: Medicare Other

## 2018-09-01 ENCOUNTER — Encounter: Payer: Self-pay | Admitting: Anesthesiology

## 2018-09-01 ENCOUNTER — Encounter
Admission: EM | Disposition: A | Discharge status: Skilled Nursing Facility | Payer: Self-pay | Source: Home / Self Care | Attending: Emergency Medicine

## 2018-09-01 DIAGNOSIS — T84020A Dislocation of internal right hip prosthesis, initial encounter: Secondary | ICD-10-CM | POA: Diagnosis not present

## 2018-09-01 HISTORY — PX: HIP CLOSED REDUCTION: SHX983

## 2018-09-01 LAB — SARS CORONAVIRUS 2 BY RT PCR (HOSPITAL ORDER, PERFORMED IN ~~LOC~~ HOSPITAL LAB): SARS Coronavirus 2: NEGATIVE

## 2018-09-01 SURGERY — CLOSED REDUCTION, HIP
Anesthesia: General | Site: Hip | Laterality: Right

## 2018-09-01 MED ORDER — LACTATED RINGERS IV SOLN
INTRAVENOUS | Status: DC | PRN
  Administered 2018-09-01: 01:00:00 via INTRAVENOUS

## 2018-09-01 MED ORDER — METOCLOPRAMIDE HCL 10 MG PO TABS: 5.0000 mg | ORAL_TABLET | Freq: Three times a day (TID) | ORAL | Status: DC | PRN

## 2018-09-01 MED ORDER — ONDANSETRON HCL 4 MG/2ML IJ SOLN: 4.0000 mg | Freq: Once | INTRAMUSCULAR | Status: DC | PRN

## 2018-09-01 MED ORDER — FENTANYL CITRATE (PF) 100 MCG/2ML IJ SOLN
INTRAMUSCULAR | Status: DC | PRN
  Administered 2018-09-01 (×2): 50 ug via INTRAVENOUS

## 2018-09-01 MED ORDER — SUCCINYLCHOLINE CHLORIDE 20 MG/ML IJ SOLN
INTRAMUSCULAR | Status: DC | PRN
  Administered 2018-09-01: 100 mg via INTRAVENOUS

## 2018-09-01 MED ORDER — METOCLOPRAMIDE HCL 5 MG/ML IJ SOLN: 5.0000 mg | Freq: Three times a day (TID) | INTRAMUSCULAR | Status: DC | PRN

## 2018-09-01 MED ORDER — PROPOFOL 10 MG/ML IV BOLUS
INTRAVENOUS | Status: DC | PRN
  Administered 2018-09-01: 100 mg via INTRAVENOUS

## 2018-09-01 MED ORDER — ONDANSETRON HCL 4 MG PO TABS: 4.0000 mg | ORAL_TABLET | Freq: Four times a day (QID) | ORAL | Status: DC | PRN

## 2018-09-01 MED ORDER — FENTANYL CITRATE (PF) 100 MCG/2ML IJ SOLN: 25.0000 ug | INTRAMUSCULAR | Status: DC | PRN

## 2018-09-01 MED ORDER — CHLORHEXIDINE GLUCONATE 4 % EX LIQD: 60.0000 mL | Freq: Once | CUTANEOUS | Status: DC

## 2018-09-01 MED ORDER — ONDANSETRON HCL 4 MG/2ML IJ SOLN: 4.0000 mg | Freq: Four times a day (QID) | INTRAMUSCULAR | Status: DC | PRN

## 2018-09-01 SURGICAL SUPPLY — 1 items: IMMBOLIZER KNEE 19 BLUE UNIV (SOFTGOODS) ×3 IMPLANT

## 2018-09-01 NOTE — NC FL2 (Signed)
  [place] LEVEL OF CARE SCREENING TOOL     IDENTIFICATION  Patient Name: Megan Donaldson Birthdate: 1928-02-06 Sex: female Admission Date (Current Location): 08/31/2018  [facility] and [place] Number:  Engineering geologist and Address:         Provider Number: [id number]  Attending Physician Name and Address:  Dereck Leep, MD  Relative Name and Phone Number:  Pamala Hurry [phone]    Current Level of Care: Hospital Recommended Level of Care: [place] Prior Approval Number:    Date Approved/Denied:   PASRR Number:    Discharge Plan: (assited living)    Current Diagnoses: Patient Active Problem List   Diagnosis Date Noted  . Acute gastric ulcer with hemorrhage 03/10/2018  . GI bleed 02/01/2018  . Altered mental status 09/17/2017  . Seizure ([place]) 08/07/2017  . 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 Gastroesophageal reflux disease 10/22/2015  . Mild depression ([place]) 10/22/2015  . H/O: GI bleed 07/30/2015  . History of deep venous thrombosis 07/30/2015  . History of pulmonary embolism 07/30/2015  . 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 S/P closed reduction of dislocated total hip prosthesis 03/19/2015  . Essential hypertension 03/23/2014  . Hyperlipidemia 03/23/2014  . Hypothyroidism 03/23/2014    Orientation RESPIRATION BLADDER Height & Weight     Self, Time, Situation, Place  Normal Continent Weight: 89 kg Height:  5\' 3"  (160 cm)  BEHAVIORAL SYMPTOMS/MOOD NEUROLOGICAL BOWEL NUTRITION STATUS      Continent Diet  AMBULATORY STATUS COMMUNICATION OF NEEDS Skin   Limited Assist Verbally Normal, Surgical wounds                       Personal Care Assistance Level of Assistance  Bathing, Dressing Bathing Assistance: Limited assistance   Dressing Assistance: Limited assistance     Functional Limitations Info  Sight, Hearing, Speech Sight Info: Adequate Hearing Info: Adequate Speech Info: Adequate     SPECIAL CARE FACTORS FREQUENCY  PT (By licensed PT)     PT Frequency: 5 times per week              Contractures Contractures Info: Not present    Additional Factors Info  Code Status, Allergies Code Status Info: DNR Allergies Info: Amoxicillin, Latex, lisinipril           Current Medications (09/01/2018):  This is the current hospital active medication list Current Facility-Administered Medications  Medication Dose Route Frequency Provider Last Rate Last Dose  . metoCLOPramide (REGLAN) tablet 5-10 mg  5-10 mg Oral Q8H PRN Hooten, Laurice Record, MD       Or  . metoCLOPramide (REGLAN) injection 5-10 mg  5-10 mg Intravenous Q8H PRN Hooten, Laurice Record, MD      . ondansetron (ZOFRAN) injection 4 mg  4 mg Intravenous Once Carrie Mew, MD      . ondansetron [facility]) tablet 4 mg  4 mg Oral Q6H PRN Hooten, Laurice Record, MD       Or  . ondansetron (ZOFRAN) injection 4 mg  4 mg Intravenous Q6H PRN Hooten, Laurice Record, MD         Discharge Medications: Please see discharge summary for a list of discharge medications.  Relevant Imaging Results:  Relevant Lab Results:   Additional Information [id number]  Su Hilt, RN

## 2018-09-01 NOTE — Progress Notes (Signed)
Notified patient's daughter Pamala Hurry that EMS has picked up patient to transport back to [facility]

## 2018-09-01 NOTE — Anesthesia Preprocedure Evaluation (Signed)
Anesthesia Evaluation  Patient identified by MRN, date of birth, ID band Patient awake    Reviewed: Allergy & Precautions, NPO status , Patient's Chart, lab work & pertinent test results, reviewed documented beta blocker date and time   Airway Mallampati: III  TM Distance: <3 FB Neck ROM: Limited    Dental  (+) Partial Upper, Missing, Chipped   Pulmonary neg pulmonary ROS,    Pulmonary exam normal breath sounds clear to auscultation       Cardiovascular hypertension, Pt. on medications and Pt. on home beta blockers +CHF  Normal cardiovascular exam     Neuro/Psych Seizures -, Well Controlled,  PSYCHIATRIC DISORDERS Anxiety Depression Dementia    GI/Hepatic Neg liver ROS, PUD, GERD  Medicated and Controlled,  Endo/Other  Hypothyroidism   Renal/GU negative Renal ROS  negative genitourinary   Musculoskeletal   Abdominal Normal abdominal exam  (+)   Peds negative pediatric ROS (+)  Hematology  (+) anemia ,   Anesthesia Other Findings Past Medical History: No date: CHF (congestive heart failure) (HCC) No date: Dementia (HCC) No date: GERD (gastroesophageal reflux disease) No date: Hyperlipidemia No date: Hypertension No date: Osteoporosis No date: Personal history of other venous thrombosis and embolism No date: Personal history of PE (pulmonary embolism)  Reproductive/Obstetrics                             Anesthesia Physical  Anesthesia Plan  ASA: III and emergent  Anesthesia Plan: General   Post-op Pain Management:    Induction: Intravenous, Rapid sequence and Cricoid pressure planned  PONV Risk Score and Plan:   Airway Management Planned: Oral ETT  Additional Equipment:   Intra-op Plan:   Post-operative Plan: Extubation in OR  Informed Consent: I have reviewed the patients History and Physical, chart, labs and discussed the procedure including the risks, benefits and  alternatives for the proposed anesthesia with the patient or authorized representative who has indicated his/her understanding and acceptance.     Dental advisory given  Plan Discussed with: CRNA and Surgeon  Anesthesia Plan Comments:         Anesthesia Quick Evaluation

## 2018-09-01 NOTE — Anesthesia Post-op Follow-up Note (Signed)
Anesthesia QCDR form completed.        

## 2018-09-01 NOTE — ED Notes (Signed)
[facility] called, updated on patient status

## 2018-09-01 NOTE — Op Note (Signed)
OPERATIVE NOTE  DATE OF SURGERY:  08/31/2018 - 09/01/2018  PATIENT NAME:  Megan Donaldson   DOB: 06-04-1927  MRN: [id number]   PRE-OPERATIVE DIAGNOSIS: Recurrent posterior dislocation of the right total hip arthroplasty  POST-OPERATIVE DIAGNOSIS:  Same  PROCEDURE: Closed reduction of the right total hip dislocation under anesthesia  SURGEON:  Marciano Sequin., M.D.   ANESTHESIA: general  ESTIMATED BLOOD LOSS: None  FLUIDS REPLACED: 225 mL of crystalloid  INDICATIONS FOR SURGERY: Megan Donaldson is a 85 y.o. year old female who has has a history of recurrent posterior dislocations of the right total hip.  Earlier this evening she was apparently reaching down and felt a "pop" to the right hip and had the immediate onset of pain.  X-rays demonstrated a posterior dislocation of the right total hip arthroplasty. After discussion of the risks and benefits of surgical intervention, the patient expressed understanding of the risks benefits and agree with plans for closed reduction of the dislocated right total hip arthroplasty under anesthesia.   PROCEDURE IN DETAIL: The patient was brought into the operating room and, after adequate general endotracheal anesthesia was achieved, a "timeout" was performed as per usual protocol.  The right hip was flexed, internally rotated, and traction was applied.  There was a palpable "clunk" and the hip was extended and externally rotated.  C arm images demonstrated concentric reduction of the hip.  The patient tolerated procedure well.  She was transported to the recovery room in stable condition.   Megan Donaldson M.D.

## 2018-09-01 NOTE — ED Notes (Signed)
Patient transported to OR.

## 2018-09-01 NOTE — Anesthesia Postprocedure Evaluation (Signed)
Anesthesia Post Note  Patient: Megan Donaldson  Procedure(s) Performed: CLOSED REDUCTION HIP (Right Hip)  Patient location during evaluation: PACU Anesthesia Type: General Level of consciousness: awake and alert and oriented Pain management: pain level controlled Vital Signs Assessment: post-procedure vital signs reviewed and stable Respiratory status: spontaneous breathing Cardiovascular status: blood pressure returned to baseline Anesthetic complications: no     Last Vitals:  Vitals:   09/01/18 0327 09/01/18 0425  BP: (!) 152/74 (!) 162/67  Pulse: (!) 53 (!) 53  Resp: 19 18  Temp:  (!) 36.2 C  SpO2: 99% 100%    Last Pain:  Vitals:   09/01/18 0305  TempSrc:   PainSc: 0-No pain                 Averil Digman

## 2018-09-01 NOTE — Anesthesia Procedure Notes (Signed)
Procedure Name: Intubation Date/Time: 09/01/2018 1:05 AM Performed by: Jonna Clark, CRNA Pre-anesthesia Checklist: Patient identified, Patient being monitored, Timeout performed, Emergency Drugs available and Suction available Patient Re-evaluated:Patient Re-evaluated prior to induction Oxygen Delivery Method: Circle system utilized Preoxygenation: Pre-oxygenation with 100% oxygen Induction Type: IV induction Ventilation: Mask ventilation without difficulty Laryngoscope Size: 3 and McGraph Grade View: Grade I Tube type: Oral Tube size: 7.0 mm Number of attempts: 1 Airway Equipment and Method: Stylet Placement Confirmation: ETT inserted through vocal cords under direct vision,  positive ETCO2 and breath sounds checked- equal and bilateral Secured at: 21 cm Tube secured with: Tape Dental Injury: Teeth and Oropharynx as per pre-operative assessment

## 2018-09-01 NOTE — Progress Notes (Signed)
  Subjective: Day of Surgery Procedure(s) (LRB): CLOSED REDUCTION HIP (Right) Patient reports pain as mild.   Patient seen in rounds with Dr. Marry Guan. Patient is well, and has had no acute complaints or problems Plan is to go Home after hospital stay. Negative for chest pain and shortness of breath Fever: no with temperature 93.8.  They are using an [place] body wrap to increase her temperature.  Her skin is normal to touch. Gastrointestinal: Negative for nausea and vomiting  Objective: Vital signs in last 24 hours: Temp:  [93.7 F (34.3 C)-97.1 F (36.2 C)] 93.8 F (34.3 C) (07/16 0631) Pulse Rate:  [50-64] 50 (07/16 0631) Resp:  [10-20] 18 (07/16 0631) BP: (122-162)/(59-100) 138/75 (07/16 0631) SpO2:  [94 %-100 %] 98 % (07/16 0631) Weight:  [85.7 kg-89 kg] 89 kg (07/16 0428)  Intake/Output from previous day:  Intake/Output Summary (Last 24 hours) at 09/01/2018 0656 Last data filed at 09/01/2018 0430 Gross per 24 hour  Intake 325 ml  Output 150 ml  Net 175 ml    Intake/Output this shift: Total I/O In: 325 [I.V.:325] Out: 150 [Urine:150]  Labs: Recent Labs    08/31/18 2145  HGB 8.3*   Recent Labs    08/31/18 2145  WBC 6.2  RBC 3.38*  HCT 27.1*  PLT 341   Recent Labs    08/31/18 2145  NA 141  K 4.1  CL 107  CO2 24  BUN 29*  CREATININE 1.23*  GLUCOSE 106*  CALCIUM 9.4   No results for input(s): LABPT, INR in the last 72 hours.   EXAM General - Patient is Alert and Confused Extremity - Neurovascular intact Sensation intact distally Compartment soft Dressing/Incision -no wound.  Knee immobilizer in place on the right knee Motor Function - intact, moving foot and toes well on exam.   Past Medical History:  Diagnosis Date  . CHF (congestive heart failure) ([place])   . Dementia ([place])   . GERD (gastroesophageal reflux disease)   . Hyperlipidemia   . Hypertension   . Osteoporosis   . Personal history of other venous thrombosis and embolism   .  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   Weight as of this encounter: 89 kg. Advance diet Up with therapy  Discharge home  DVT Prophylaxis - None Weight-Bearing as tolerated to right leg  Reche Dixon, PA-C Orthopaedic Surgery 09/01/2018, 6:56 AM

## 2018-09-01 NOTE — TOC Transition Note (Signed)
Transition of Care [facility]) - CM/SW Discharge Note   Patient Details  Name: Megan Donaldson MRN: [id number] Date of Birth: February 21, 1927  Transition of Care [facility]) CM/SW Contact:  Su Hilt, RN Phone Number: 09/01/2018, 9:36 AM   Clinical Narrative:    Met with patient to discuss DC plan and needs She lives at [facility] assisted living and will be returning. Spoke with the Patient's daughter Pamala Hurry and she is unable to provide transportation today Spoke to [place] at [employer] and she stated that they are unable to provide transportation that patient will need EMS transport [place] did however state that they set up the Physical therapy.  I faxed the DC summary and orders to the facility thru the Hub The patient has a Rw and the daughter would like to have a [facility], EMS will not transport one and [place] at [facility] stated that they would set her up to have one. The bedside nurse to call EMS when ready to DC    Final next level of care: Assisted Living Barriers to Discharge: Barriers Resolved   Patient Goals and CMS Choice Patient states their goals for this hospitalization and ongoing recovery are:: go back to her assisted living [facility] CMS Medicare.gov Compare Post Acute Care list provided to:: Patient Choice offered to / list presented to : Patient  Discharge Placement                       Discharge Plan and Services   Discharge Planning Services: CM Consult Post Acute Care Choice: Home Health          DME Arranged: 3-N-1 DME Agency: [employer] Date DME Agency Contacted: 09/01/18 Time DME Agency Contacted: [phone] Representative spoke with at DME Agency: [place]: OT, Nurse's Aide [place] Agency: [place] Date [place]: 09/01/18 Time [place]: [phone] Representative spoke with at [place]: [place] ([place]) Interventions     Readmission Risk Interventions No flowsheet  data found.

## 2018-09-01 NOTE — Transfer of Care (Signed)
Immediate Anesthesia Transfer of Care Note  Patient: Megan Donaldson  Procedure(s) Performed: CLOSED REDUCTION HIP (Right Hip)  Patient Location: PACU  Anesthesia Type:General  Level of Consciousness: drowsy and patient cooperative  Airway & Oxygen Therapy: Patient Spontanous Breathing and Patient connected to face mask oxygen  Post-op Assessment: Report given to RN and Post -op Vital signs reviewed and stable  Post vital signs: Reviewed and stable  Last Vitals:  Vitals Value Taken Time  BP 122/60 09/01/18 0124  Temp 36.1 C 09/01/18 0124  Pulse 56 09/01/18 0124  Resp 11 09/01/18 0124  SpO2 99 % 09/01/18 0124  Vitals shown include unvalidated device data.  Last Pain:  Vitals:   09/01/18 0124  TempSrc:   PainSc: Asleep         Complications: No apparent anesthesia complications

## 2018-09-01 NOTE — Discharge Instructions (Signed)
INSTRUCTIONS AFTER Surgery  o Remove items at home which could result in a fall. This includes throw rugs or furniture in walking pathways o ICE to the affected joint every three hours while awake for 30 minutes at a time, for at least the first 3-5 days, and then as needed for pain and swelling.  Continue to use ice for pain and swelling. You may notice swelling that will progress down to the foot and ankle.  This is normal after surgery.  Elevate your leg when you are not up walking on it.   o Continue to use the breathing machine you got in the hospital (incentive spirometer) which will help keep your temperature down.  It is common for your temperature to cycle up and down following surgery, especially at night when you are not up moving around and exerting yourself.  The breathing machine keeps your lungs expanded and your temperature down.   DIET:  As you were doing prior to hospitalization, we recommend a well-balanced diet.  DRESSING / WOUND CARE / SHOWERING  The knee immobilizer needs to remain in place at all times with standing and lying down.  They continue bathing with her lying down.  The knee immobilizer needs to remain for 6 weeks.  ACTIVITY  o Increase activity slowly as tolerated, but follow the weight bearing instructions below.   o No driving for 6 weeks or until further direction given by your physician.  You cannot drive while taking narcotics.  o No lifting or carrying greater than 10 lbs. until further directed by your surgeon. o Avoid periods of inactivity such as sitting longer than an hour when not asleep. This helps prevent blood clots.  o You may return to work once you are authorized by your doctor.     WEIGHT BEARING  Weightbearing as tolerated on the right   EXERCISES Gait training and ambulation but no knee range of motion and limited hip range of motion.  CONSTIPATION  Constipation is defined medically as fewer than three stools per week and severe  constipation as less than one stool per week.  Even if you have a regular bowel pattern at home, your normal regimen is likely to be disrupted due to multiple reasons following surgery.  Combination of anesthesia, postoperative narcotics, change in appetite and fluid intake all can affect your bowels.   YOU MUST use at least one of the following options; they are listed in order of increasing strength to get the job done.  They are all available over the counter, and you may need to use some, POSSIBLY even all of these options:    Drink plenty of fluids (prune juice may be helpful) and high fiber foods Colace 100 mg by mouth twice a day  Senokot for constipation as directed and as needed Dulcolax (bisacodyl), take with full glass of water  Miralax (polyethylene glycol) once or twice a day as needed.  If you have tried all these things and are unable to have a bowel movement in the first 3-4 days after surgery call either your surgeon or your primary doctor.    If you experience loose stools or diarrhea, hold the medications until you stool forms back up.  If your symptoms do not get better within 1 week or if they get worse, check with your doctor.  If you experience "the worst abdominal pain ever" or develop nausea or vomiting, please contact the office immediately for further recommendations for treatment.   ITCHING:  If you experience itching with your medications, try taking only a single pain pill, or even half a pain pill at a time.  You can also use Benadryl over the counter for itching or also to help with sleep.   TED HOSE STOCKINGS:  Use stockings on both legs until for at least 2 weeks or as directed by physician office. They may be removed at night for sleeping.  MEDICATIONS:  See your medication summary on the After Visit Summary that nursing will review with you.  You may have some home medications which will be placed on hold until you complete the course of blood thinner  medication.  It is important for you to complete the blood thinner medication as prescribed.  PRECAUTIONS:  If you experience chest pain or shortness of breath - call 911 immediately for transfer to the hospital emergency department.   If you develop a fever greater that 101 F, purulent drainage from wound, increased redness or drainage from wound, foul odor from the wound/dressing, or calf pain - CONTACT YOUR SURGEON.                                                   FOLLOW-UP APPOINTMENTS:  If you do not already have a post-op appointment, please call the office for an appointment to be seen by your surgeon.  Guidelines for how soon to be seen are listed in your After Visit Summary, but are typically between 1-4 weeks after surgery.  OTHER INSTRUCTIONS:     MAKE SURE YOU:   Understand these instructions.   Get help right away if you are not doing well or get worse.    Thank you for letting us be a part of your medical care team.  It is a privilege we respect greatly.  We hope these instructions will help you stay on track for a fast and full recovery!

## 2018-09-01 NOTE — Discharge Summary (Signed)
Physician Discharge Summary  Subjective: Day of Surgery Procedure(s) (LRB): CLOSED REDUCTION HIP (Right) Patient reports pain as mild.   Patient seen in rounds with Dr. Ernest PineHooten. Patient is well, and has had no acute complaints or problems Patient is ready to go home  Physician Discharge Summary  Patient ID: Megan Donaldson MRN: [id number] DOB/AGE: 10-16-1927 85 y.o.  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  Her pain level is very manageable.  Her temperature has remained low to 93.8, but they have been trying to warm her up with blankets.  Her vitals have remained stable, besides the temperature and some bradycardia.  Treatments: surgery:  Closed reduction of the right total hip dislocation under anesthesia  SURGEON:  Jena GaussJames P Hooten, Jr., M.D.          ANESTHESIA: general  ESTIMATED BLOOD LOSS: None  FLUIDS REPLACED: 225 mL of crystalloid  Discharge Exam: Blood pressure 138/75, pulse (!) 50, temperature (!) 93.8 F (34.3 C), temperature source Rectal, resp. rate 18, height 5\' 3"  (1.6 m), weight 89 kg, SpO2 98 %.   Disposition: Discharge disposition: 01-Home or Self Care       Discharge Instructions    Call MD / Call 911   Complete by: As directed    If you experience chest pain or shortness of breath, CALL 911 and be transported to the hospital emergency room.  If you develope a fever above 101 F, pus (white drainage) or increased drainage or redness at the wound, or calf pain, call your surgeon's office.   Constipation Prevention   Complete by: As directed    Drink plenty of fluids.  Prune juice may be helpful.  You may use a stool softener, such as Colace (over the counter) 100 mg twice a day.  Use  MiraLax (over the counter) for constipation as needed.   Diet general   Complete by: As directed    Follow the hip precautions as taught in Physical Therapy   Complete by: As directed    Increase activity slowly as tolerated   Complete by: As directed    Weight bearing as tolerated   Complete by: As directed    Laterality: bilateral     Allergies as of 09/01/2018      Reactions   Amoxicillin    Has patient had a PCN reaction causing immediate rash, facial/tongue/throat swelling, SOB or lightheadedness with hypotension: Unknown Has patient had a PCN reaction causing severe rash involving mucus membranes or skin necrosis: Unknown Has patient had a PCN reaction that required hospitalization: Unknown Has patient had a PCN reaction occurring within the last 10 years: Unknown If all of the above answers are "NO", then may proceed with Cephalosporin use.   Latex    Lisinopril       Medication List    STOP taking these medications   Lidocaine (Anorectal) 5 % Crea     TAKE these medications   acetaminophen 500 MG tablet Commonly known as: TYLENOL Take 1,000 mg by mouth 2 (two) times daily.   aspirin EC 81 MG tablet Take 81 mg by mouth daily.   Calcium 600-D 600-400 MG-UNIT Tabs Generic drug: Calcium Carbonate-Vitamin D3 Take 1 tablet by mouth 2 (two) times daily.   escitalopram  10 MG tablet Commonly known as: LEXAPRO Take 10 mg by mouth daily.   furosemide 20 MG tablet Commonly known as: LASIX Take 20 mg by mouth every other day.   lamoTRIgine 25 MG tablet Commonly known as: LAMICTAL Take 50 mg by mouth at bedtime.   lamoTRIgine 25 MG tablet Commonly known as: LAMICTAL Take 37.5 mg by mouth daily.   levothyroxine 75 MCG tablet Commonly known as: SYNTHROID Take 75 mcg by mouth daily.   metoprolol succinate 25 MG 24 hr tablet Commonly known as: TOPROL-XL Take 1 tablet (25 mg total) by mouth daily.   pantoprazole 40 MG tablet Commonly known as: PROTONIX Take 40  mg by mouth 2 (two) times daily.   PEG 3350 17 GM/SCOOP Powd Take 17 g by mouth every Monday, Wednesday, and Friday.   senna-docusate 8.6-50 MG tablet Commonly known as: Senokot-S Take 2 tablets by mouth at bedtime.   simvastatin 40 MG tablet Commonly known as: ZOCOR Take 40 mg by mouth at bedtime.   traZODone 100 MG tablet Commonly known as: DESYREL Take 100 mg by mouth at bedtime.   white petrolatum Gel Commonly known as: VASELINE Apply 1 application topically 3 (three) times daily. Apply to lips            Discharge Care Instructions  (From admission, onward)         Start     Ordered   09/01/18 0000  Weight bearing as tolerated    Question:  Laterality  Answer:  bilateral   09/01/18 0136         Follow-up Information    Hooten, Illene LabradorJames P, MD In 2 weeks.   Specialty: Orthopedic Surgery Contact information: [address] [facility] [place] [address] [phone]           Signed: Lenard ForthMUNDY, Kaien Pezzullo 09/01/2018, 7:02 AM   Objective: Vital signs in last 24 hours: Temp:  [93.7 F (34.3 C)-97.1 F (36.2 C)] 93.8 F (34.3 C) (07/16 0631) Pulse Rate:  [50-64] 50 (07/16 0631) Resp:  [10-20] 18 (07/16 0631) BP: (122-162)/(59-100) 138/75 (07/16 0631) SpO2:  [94 %-100 %] 98 % (07/16 0631) Weight:  [85.7 kg-89 kg] 89 kg (07/16 0428)  Intake/Output from previous day:  Intake/Output Summary (Last 24 hours) at 09/01/2018 0702 Last data filed at 09/01/2018 0430 Gross per 24 hour  Intake 325 ml  Output 150 ml  Net 175 ml    Intake/Output this shift: No intake/output data recorded.  Labs: Recent Labs    08/31/18 2145  HGB 8.3*   Recent Labs    08/31/18 2145  WBC 6.2  RBC 3.38*  HCT 27.1*  PLT 341   Recent Labs    08/31/18 2145  NA 141  K 4.1  CL 107  CO2 24  BUN 29*  CREATININE 1.23*  GLUCOSE 106*  CALCIUM 9.4   No results for input(s): LABPT, INR in the last 72 hours.  EXAM: General - Patient is Alert and  Confused Extremity - Neurovascular intact Sensation intact distally Compartment soft Incision -no wound.  Knee immobilizer on the right knee place. Motor Function -plantarflexion and dorsiflexion intact.  Assessment/Plan: Day of Surgery Procedure(s) (LRB): CLOSED REDUCTION HIP (Right) Procedure(s) (LRB): CLOSED REDUCTION HIP (Right) Past Medical History:  Diagnosis Date  . CHF (congestive heart failure) ([facility])   . Dementia ([facility])   . GERD (gastroesophageal reflux disease)   . Hyperlipidemia   . Hypertension   . Osteoporosis   . Personal history of  other venous thrombosis and embolism   . 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   Weight as of this encounter: 89 kg. Advance diet Up with therapy D/C IV fluids Diet - Regular diet Follow up - in 2 weeks Activity - WBAT Disposition - Home Condition Upon Discharge - Stable DVT Prophylaxis - None  Reche Dixon, PA-C Orthopaedic Surgery 09/01/2018, 7:02 AM

## 2018-09-02 ENCOUNTER — Encounter: Payer: Self-pay | Admitting: Orthopedic Surgery

## 2018-11-10 ENCOUNTER — Other Ambulatory Visit: Payer: Self-pay

## 2018-11-10 ENCOUNTER — Inpatient Hospital Stay
Admission: EM | Admit: 2018-11-10 | Discharge: 2018-11-15 | DRG: 603 | Disposition: A | Discharge status: Home Health | Payer: Medicare Other | Source: Skilled Nursing Facility | Attending: Internal Medicine | Admitting: Internal Medicine

## 2018-11-10 ENCOUNTER — Encounter: Payer: Self-pay | Admitting: Emergency Medicine

## 2018-11-10 ENCOUNTER — Emergency Department: Payer: Medicare Other

## 2018-11-10 DIAGNOSIS — M810 Age-related osteoporosis without current pathological fracture: Secondary | ICD-10-CM | POA: Diagnosis present

## 2018-11-10 DIAGNOSIS — N179 Acute kidney failure, unspecified: Secondary | ICD-10-CM | POA: Diagnosis present

## 2018-11-10 DIAGNOSIS — R680 Hypothermia, not associated with low environmental temperature: Secondary | ICD-10-CM | POA: Diagnosis present

## 2018-11-10 DIAGNOSIS — Z7982 Long term (current) use of aspirin: Secondary | ICD-10-CM

## 2018-11-10 DIAGNOSIS — W19XXXA Unspecified fall, initial encounter: Secondary | ICD-10-CM | POA: Diagnosis present

## 2018-11-10 DIAGNOSIS — L03115 Cellulitis of right lower limb: Secondary | ICD-10-CM | POA: Diagnosis present

## 2018-11-10 DIAGNOSIS — Z8249 Family history of ischemic heart disease and other diseases of the circulatory system: Secondary | ICD-10-CM

## 2018-11-10 DIAGNOSIS — Z96641 Presence of right artificial hip joint: Secondary | ICD-10-CM | POA: Diagnosis present

## 2018-11-10 DIAGNOSIS — S62327A Displaced fracture of shaft of fifth metacarpal bone, left hand, initial encounter for closed fracture: Secondary | ICD-10-CM | POA: Diagnosis present

## 2018-11-10 DIAGNOSIS — E785 Hyperlipidemia, unspecified: Secondary | ICD-10-CM | POA: Diagnosis present

## 2018-11-10 DIAGNOSIS — I130 Hypertensive heart and chronic kidney disease with heart failure and stage 1 through stage 4 chronic kidney disease, or unspecified chronic kidney disease: Secondary | ICD-10-CM | POA: Diagnosis present

## 2018-11-10 DIAGNOSIS — X58XXXA Exposure to other specified factors, initial encounter: Secondary | ICD-10-CM | POA: Diagnosis present

## 2018-11-10 DIAGNOSIS — E876 Hypokalemia: Secondary | ICD-10-CM | POA: Diagnosis not present

## 2018-11-10 DIAGNOSIS — S80822A Blister (nonthermal), left lower leg, initial encounter: Secondary | ICD-10-CM | POA: Diagnosis present

## 2018-11-10 DIAGNOSIS — L03116 Cellulitis of left lower limb: Secondary | ICD-10-CM | POA: Diagnosis not present

## 2018-11-10 DIAGNOSIS — I5032 Chronic diastolic (congestive) heart failure: Secondary | ICD-10-CM | POA: Diagnosis present

## 2018-11-10 DIAGNOSIS — D649 Anemia, unspecified: Secondary | ICD-10-CM | POA: Diagnosis present

## 2018-11-10 DIAGNOSIS — S80221A Blister (nonthermal), right knee, initial encounter: Secondary | ICD-10-CM | POA: Diagnosis present

## 2018-11-10 DIAGNOSIS — Z79890 Hormone replacement therapy (postmenopausal): Secondary | ICD-10-CM

## 2018-11-10 DIAGNOSIS — S62613A Displaced fracture of proximal phalanx of left middle finger, initial encounter for closed fracture: Secondary | ICD-10-CM | POA: Diagnosis present

## 2018-11-10 DIAGNOSIS — Z20828 Contact with and (suspected) exposure to other viral communicable diseases: Secondary | ICD-10-CM | POA: Diagnosis present

## 2018-11-10 DIAGNOSIS — Z86718 Personal history of other venous thrombosis and embolism: Secondary | ICD-10-CM

## 2018-11-10 DIAGNOSIS — Z91040 Latex allergy status: Secondary | ICD-10-CM

## 2018-11-10 DIAGNOSIS — Z888 Allergy status to other drugs, medicaments and biological substances status: Secondary | ICD-10-CM

## 2018-11-10 DIAGNOSIS — S70321A Blister (nonthermal), right thigh, initial encounter: Secondary | ICD-10-CM | POA: Diagnosis present

## 2018-11-10 DIAGNOSIS — R296 Repeated falls: Secondary | ICD-10-CM | POA: Diagnosis present

## 2018-11-10 DIAGNOSIS — K219 Gastro-esophageal reflux disease without esophagitis: Secondary | ICD-10-CM | POA: Diagnosis present

## 2018-11-10 DIAGNOSIS — S62643A Nondisplaced fracture of proximal phalanx of left middle finger, initial encounter for closed fracture: Secondary | ICD-10-CM

## 2018-11-10 DIAGNOSIS — E039 Hypothyroidism, unspecified: Secondary | ICD-10-CM | POA: Diagnosis present

## 2018-11-10 DIAGNOSIS — Z880 Allergy status to penicillin: Secondary | ICD-10-CM

## 2018-11-10 DIAGNOSIS — N183 Chronic kidney disease, stage 3 (moderate): Secondary | ICD-10-CM | POA: Diagnosis present

## 2018-11-10 DIAGNOSIS — S62307A Unspecified fracture of fifth metacarpal bone, left hand, initial encounter for closed fracture: Secondary | ICD-10-CM

## 2018-11-10 DIAGNOSIS — F0390 Unspecified dementia without behavioral disturbance: Secondary | ICD-10-CM | POA: Diagnosis present

## 2018-11-10 DIAGNOSIS — L97929 Non-pressure chronic ulcer of unspecified part of left lower leg with unspecified severity: Secondary | ICD-10-CM | POA: Diagnosis present

## 2018-11-10 DIAGNOSIS — Z86711 Personal history of pulmonary embolism: Secondary | ICD-10-CM

## 2018-11-10 DIAGNOSIS — Z79899 Other long term (current) drug therapy: Secondary | ICD-10-CM

## 2018-11-10 DIAGNOSIS — R600 Localized edema: Secondary | ICD-10-CM

## 2018-11-10 DIAGNOSIS — Z66 Do not resuscitate: Secondary | ICD-10-CM | POA: Diagnosis present

## 2018-11-10 LAB — CBC WITH DIFFERENTIAL/PLATELET
Abs Immature Granulocytes: 0.02 10*3/uL (ref 0.00–0.07)
Basophils Absolute: 0 10*3/uL (ref 0.0–0.1)
Basophils Relative: 1 %
Eosinophils Absolute: 0.1 10*3/uL (ref 0.0–0.5)
Eosinophils Relative: 1 %
HCT: 31.5 % — ABNORMAL LOW (ref 36.0–46.0)
Hemoglobin: 9.8 g/dL — ABNORMAL LOW (ref 12.0–15.0)
Immature Granulocytes: 0 %
Lymphocytes Relative: 25 %
Lymphs Abs: 1.7 10*3/uL (ref 0.7–4.0)
MCH: 27 pg (ref 26.0–34.0)
MCHC: 31.1 g/dL (ref 30.0–36.0)
MCV: 86.8 fL (ref 80.0–100.0)
Monocytes Absolute: 0.7 10*3/uL (ref 0.1–1.0)
Monocytes Relative: 10 %
Neutro Abs: 4.1 10*3/uL (ref 1.7–7.7)
Neutrophils Relative %: 63 %
Platelets: 246 10*3/uL (ref 150–400)
RBC: 3.63 MIL/uL — ABNORMAL LOW (ref 3.87–5.11)
RDW: 23.8 % — ABNORMAL HIGH (ref 11.5–15.5)
WBC: 6.6 10*3/uL (ref 4.0–10.5)
nRBC: 0 % (ref 0.0–0.2)

## 2018-11-10 LAB — COMPREHENSIVE METABOLIC PANEL
ALT: 19 U/L (ref 0–44)
AST: 31 U/L (ref 15–41)
Albumin: 3.9 g/dL (ref 3.5–5.0)
Alkaline Phosphatase: 70 U/L (ref 38–126)
Anion gap: 9 (ref 5–15)
BUN: 29 mg/dL — ABNORMAL HIGH (ref 8–23)
CO2: 28 mmol/L (ref 22–32)
Calcium: 10 mg/dL (ref 8.9–10.3)
Chloride: 103 mmol/L (ref 98–111)
Creatinine, Ser: 1.32 mg/dL — ABNORMAL HIGH (ref 0.44–1.00)
GFR calc Af Amer: 41 mL/min — ABNORMAL LOW (ref >60)
GFR calc non Af Amer: 36 mL/min — ABNORMAL LOW (ref >60)
Glucose, Bld: 88 mg/dL (ref 70–99)
Potassium: 4.2 mmol/L (ref 3.5–5.1)
Sodium: 140 mmol/L (ref 135–145)
Total Bilirubin: 0.7 mg/dL (ref 0.3–1.2)
Total Protein: 6.8 g/dL (ref 6.5–8.1)

## 2018-11-10 LAB — SARS CORONAVIRUS 2 BY RT PCR (HOSPITAL ORDER, PERFORMED IN ~~LOC~~ HOSPITAL LAB): SARS Coronavirus 2: NEGATIVE

## 2018-11-10 MED ORDER — LEVOFLOXACIN IN D5W 750 MG/150ML IV SOLN
750.0000 mg | Freq: Once | INTRAVENOUS | Status: AC
  Administered 2018-11-10: 750 mg via INTRAVENOUS
  Filled 2018-11-10: qty 150

## 2018-11-10 MED ORDER — VANCOMYCIN HCL IN DEXTROSE 1-5 GM/200ML-% IV SOLN
1000.0000 mg | Freq: Once | INTRAVENOUS | Status: DC
  Filled 2018-11-10: qty 200

## 2018-11-10 MED ORDER — CALCIUM CARBONATE-VITAMIN D 500-200 MG-UNIT PO TABS
1.0000 | ORAL_TABLET | Freq: Two times a day (BID) | ORAL | Status: DC
  Administered 2018-11-11 – 2018-11-15 (×10): 1 via ORAL
  Filled 2018-11-10 (×10): qty 1

## 2018-11-10 MED ORDER — VANCOMYCIN HCL 1.5 G IV SOLR
1500.0000 mg | Freq: Once | INTRAVENOUS | Status: AC
  Administered 2018-11-10: 22:00:00 1500 mg via INTRAVENOUS
  Filled 2018-11-10: qty 1500

## 2018-11-10 MED ORDER — SENNOSIDES-DOCUSATE SODIUM 8.6-50 MG PO TABS
2.0000 | ORAL_TABLET | Freq: Every day | ORAL | Status: DC
  Administered 2018-11-11 – 2018-11-14 (×5): 2 via ORAL
  Filled 2018-11-10 (×5): qty 2

## 2018-11-10 MED ORDER — TRAZODONE HCL 100 MG PO TABS
100.0000 mg | ORAL_TABLET | Freq: Every day | ORAL | Status: DC
  Administered 2018-11-11 – 2018-11-14 (×5): 100 mg via ORAL
  Filled 2018-11-10 (×5): qty 1

## 2018-11-10 MED ORDER — VANCOMYCIN HCL IN DEXTROSE 1-5 GM/200ML-% IV SOLN: 1000.0000 mg | Freq: Once | INTRAVENOUS | Status: DC

## 2018-11-10 MED ORDER — ACETAMINOPHEN 500 MG PO TABS
1000.0000 mg | ORAL_TABLET | Freq: Two times a day (BID) | ORAL | Status: DC
  Administered 2018-11-11 – 2018-11-15 (×10): 1000 mg via ORAL
  Filled 2018-11-10 (×10): qty 2

## 2018-11-10 MED ORDER — WHITE PETROLATUM EX OINT
1.0000 "application " | TOPICAL_OINTMENT | Freq: Three times a day (TID) | CUTANEOUS | Status: DC
  Administered 2018-11-11 – 2018-11-15 (×13): 1 via TOPICAL
  Filled 2018-11-10 (×2): qty 5

## 2018-11-10 MED ORDER — PANTOPRAZOLE SODIUM 40 MG PO TBEC
40.0000 mg | DELAYED_RELEASE_TABLET | Freq: Two times a day (BID) | ORAL | Status: DC
  Administered 2018-11-11 – 2018-11-15 (×10): 40 mg via ORAL
  Filled 2018-11-10 (×10): qty 1

## 2018-11-10 MED ORDER — ASPIRIN EC 81 MG PO TBEC
81.0000 mg | DELAYED_RELEASE_TABLET | Freq: Every day | ORAL | Status: DC
  Administered 2018-11-11 – 2018-11-15 (×5): 81 mg via ORAL
  Filled 2018-11-10 (×5): qty 1

## 2018-11-10 MED ORDER — ENOXAPARIN SODIUM 30 MG/0.3ML ~~LOC~~ SOLN
30.0000 mg | SUBCUTANEOUS | Status: DC
  Administered 2018-11-11: 02:00:00 30 mg via SUBCUTANEOUS
  Filled 2018-11-10: qty 0.3

## 2018-11-10 MED ORDER — LEVOTHYROXINE SODIUM 50 MCG PO TABS
75.0000 ug | ORAL_TABLET | Freq: Every day | ORAL | Status: DC
  Administered 2018-11-11 – 2018-11-15 (×5): 75 ug via ORAL
  Filled 2018-11-10 (×5): qty 2

## 2018-11-10 MED ORDER — ESCITALOPRAM OXALATE 10 MG PO TABS
10.0000 mg | ORAL_TABLET | Freq: Every day | ORAL | Status: DC
  Administered 2018-11-11 – 2018-11-15 (×5): 10 mg via ORAL
  Filled 2018-11-10 (×5): qty 1

## 2018-11-10 MED ORDER — LAMOTRIGINE 25 MG PO TABS
50.0000 mg | ORAL_TABLET | Freq: Every day | ORAL | Status: DC
  Administered 2018-11-11 – 2018-11-14 (×5): 50 mg via ORAL
  Filled 2018-11-10 (×5): qty 2

## 2018-11-10 MED ORDER — BACITRACIN-NEOMYCIN-POLYMYXIN OINTMENT TUBE
1.0000 "application " | TOPICAL_OINTMENT | Freq: Every day | CUTANEOUS | Status: DC
  Administered 2018-11-11 – 2018-11-15 (×5): 1 via TOPICAL
  Filled 2018-11-10: qty 14.17

## 2018-11-10 MED ORDER — FERROUS SULFATE 325 (65 FE) MG PO TABS
325.0000 mg | ORAL_TABLET | Freq: Every day | ORAL | Status: DC
  Administered 2018-11-11 – 2018-11-15 (×5): 325 mg via ORAL
  Filled 2018-11-10 (×6): qty 1

## 2018-11-10 MED ORDER — SIMVASTATIN 40 MG PO TABS
40.0000 mg | ORAL_TABLET | Freq: Every day | ORAL | Status: DC
  Administered 2018-11-11 – 2018-11-14 (×5): 40 mg via ORAL
  Filled 2018-11-10 (×6): qty 1

## 2018-11-10 MED ORDER — LAMOTRIGINE 25 MG PO TABS
37.5000 mg | ORAL_TABLET | Freq: Every day | ORAL | Status: DC
  Administered 2018-11-11 – 2018-11-15 (×5): 37.5 mg via ORAL
  Filled 2018-11-10 (×5): qty 2

## 2018-11-10 MED ORDER — METOPROLOL SUCCINATE ER 25 MG PO TB24
25.0000 mg | ORAL_TABLET | Freq: Every day | ORAL | Status: DC
  Administered 2018-11-11 – 2018-11-15 (×5): 25 mg via ORAL
  Filled 2018-11-10 (×5): qty 1

## 2018-11-10 MED ORDER — POLYETHYLENE GLYCOL 3350 17 G PO PACK: 17.0000 g | PACK | Freq: Every day | ORAL | Status: DC | PRN

## 2018-11-10 NOTE — ED Provider Notes (Signed)
[facility] Emergency Department Provider Note   ____________________________________________   First MD Initiated Contact with Patient 11/10/18 1942     (approximate)  I have reviewed the triage vital signs and the nursing notes.   HISTORY  Chief Complaint Hand Pain    HPI Megan Donaldson is a 85 y.o. female here for evaluation after falling which she reports is a day ago.  She had an x-ray done demonstrate a fracture in her left hand.  She also had an x-ray of the left knee which did not show fracture.  She is also noticed a couple of blisters on her left leg and redness.  She reports that she does fall occasionally.  She fell did not strike her head or injure her neck.  She is not having any back pain chest pain or trouble breathing  She reports she is not any pain right now.  But she does note that there was an injury to her left hand and her told she to come have her fracture checked out.     Past Medical History:  Diagnosis Date  . CHF (congestive heart failure) (HCC)   . Dementia (HCC)   . GERD (gastroesophageal reflux disease)   . Hyperlipidemia   . Hypertension   . Osteoporosis   . Personal history of other venous thrombosis and embolism   . Personal history of PE (pulmonary embolism)     Patient Active Problem List   Diagnosis Date Noted  . Bilateral lower leg cellulitis 11/10/2018  . Acute gastric ulcer with hemorrhage 03/10/2018  . GI bleed 02/01/2018  . Altered mental status 09/17/2017  . Seizure (HCC) 08/07/2017  . 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 Gastroesophageal reflux disease 10/22/2015  . Mild depression (HCC) 10/22/2015  . H/O: GI bleed 07/30/2015  . History of deep venous thrombosis 07/30/2015  . History of pulmonary embolism 07/30/2015  . 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 S/P closed reduction of dislocated total hip prosthesis 03/19/2015  . Essential hypertension 03/23/2014  .  Hyperlipidemia 03/23/2014  . Hypothyroidism 03/23/2014    Past Surgical History:  Procedure Laterality Date  . ESOPHAGOGASTRODUODENOSCOPY N/A 02/02/2018   Procedure: ESOPHAGOGASTRODUODENOSCOPY (EGD);  Surgeon: Toledo, Boykin Nearing, MD;  Location: [facility] ENDOSCOPY;  Service: Gastroenterology;  Laterality: N/A;  . HIP CLOSED REDUCTION Right 12/21/2014   Procedure: CLOSED REDUCTION HIP;  Surgeon: Deeann Saint, MD;  Location: [facility] ORS;  Service: Orthopedics;  Laterality: Right;  . HIP CLOSED REDUCTION Right 09/01/2018   Procedure: CLOSED REDUCTION HIP;  Surgeon: Donato Heinz, MD;  Location: [facility] ORS;  Service: Orthopedics;  Laterality: Right;  . TOTAL HIP ARTHROPLASTY Right    x3    Prior to Admission medications   Medication Sig Start Date End Date Taking? Authorizing Provider  acetaminophen (TYLENOL) 500 MG tablet Take 1,000 mg by mouth 2 (two) times daily.    Yes [provider]  aspirin EC 81 MG tablet Take 81 mg by mouth daily.   Yes [provider]  Calcium Carbonate-Vitamin D3 (CALCIUM 600-D) 600-400 MG-UNIT TABS Take 1 tablet by mouth 2 (two) times daily.   Yes [provider]  escitalopram (LEXAPRO) 10 MG tablet Take 10 mg by mouth daily. 07/21/17  Yes [provider]  Ferrous Sulfate 142 (45 Fe) MG TBCR Take 45 mg by mouth daily.   Yes [provider]  furosemide (LASIX) 20 MG tablet Take 20 mg by mouth every other day.   Yes  [provider]  lamoTRIgine (LAMICTAL) 25 MG tablet Take 37.5 mg by mouth daily.  01/17/18 01/17/19 Yes [provider]  lamoTRIgine (LAMICTAL) 25 MG tablet Take 50 mg by mouth at bedtime.   Yes [provider]  levothyroxine (SYNTHROID, LEVOTHROID) 75 MCG tablet Take 75 mcg by mouth daily.   Yes [provider]  metoprolol succinate (TOPROL-XL) 25 MG 24 hr tablet Take 1 tablet (25 mg total) by mouth daily. 09/20/17  Yes Alford Highland, MD  neomycin-bacitracin-polymyxin (NEOSPORIN) OINT  Apply 1 application topically daily. (apply to left leg)   Yes [provider]  pantoprazole (PROTONIX) 40 MG tablet Take 40 mg by mouth 2 (two) times daily.   Yes [provider]  Polyethylene Glycol 3350 (PEG 3350) 17 g PACK Take 17 g by mouth daily as needed (moderate constipation).    Yes [provider]  senna-docusate (SENOKOT-S) 8.6-50 MG tablet Take 2 tablets by mouth at bedtime.   Yes [provider]  simvastatin (ZOCOR) 40 MG tablet Take 40 mg by mouth at bedtime. 05/04/17  Yes [provider]  traZODone (DESYREL) 100 MG tablet Take 100 mg by mouth at bedtime.   Yes [provider]  white petrolatum (VASELINE) GEL Apply 1 application topically 3 (three) times daily. Apply to lips   Yes [provider]    Allergies Amoxicillin, Latex, and Lisinopril  Family History  Problem Relation Age of Onset  . CAD Father     Social History Social History   Tobacco Use  . Smoking status: Never Smoker  . Smokeless tobacco: Never Used  Substance Use Topics  . Alcohol use: No  . Drug use: No    Review of Systems Constitutional: No fever/chills Eyes: No visual changes. ENT: No sore throat.  Denies neck pain. Cardiovascular: Denies chest pain. Respiratory: Denies shortness of breath. Gastrointestinal: No abdominal pain.   Genitourinary: Negative for dysuria. Musculoskeletal: Negative for back pain. Skin: Bruising over both of her hands and forearms.  Also redness and an open blister on her left lower leg that is been managed.  Additionally small blister on her right upper leg around the knee area as well Neurological: Negative for headaches, areas of focal weakness or numbness.    ____________________________________________   PHYSICAL EXAM:  VITAL SIGNS: ED Triage Vitals  Enc Vitals Group     BP 11/10/18 1810 (!) 147/68     Pulse Rate 11/10/18 1810 (!) 57     Resp 11/10/18 1810 18     Temp 11/10/18 1810 97.6 F  (36.4 C)     Temp Source 11/10/18 1810 Oral     SpO2 11/10/18 1805 95 %     Weight 11/10/18 1811 197 lb (89.4 kg)     Height 11/10/18 1811  (1.6 m)     Head Circumference --      Peak Flow --      Pain Score 11/10/18 1810 0     Pain Loc --      Pain Edu? --      Excl. in GC? --    Constitutional: Alert and oriented. Well appearing and in no acute distress.  She is quite pleasant. Eyes: Conjunctivae are normal. Head: Atraumatic. Nose: No congestion/rhinnorhea. Mouth/Throat: Mucous membranes are moist. Neck: No stridor.  Cardiovascular: Normal rate, regular rhythm. Grossly normal heart sounds.  Good peripheral circulation. Respiratory: Normal respiratory effort.  No retractions. Lungs CTAB. Gastrointestinal: Soft and nontender. No distention. Musculoskeletal: She has bruising on  the volar surfaces of both forearms, mild more distal than proximal.  No bony tenderness.  She does have arthritic appearing hands, but moves all fingers well but reports a little bit of tenderness over the left hand metacarpals.  No snuffbox tenderness.  No tenderness the right hand.  Lower extremities demonstrate significant erythema and warmth from about the left knee down to the ankle with a open sore that is draining clear fluid but erythematous around.  Right lower extremity also demonstrates a small open blister and blister overlying the right anterior knee.  There is redness not as pronounced but evident over the right lower leg as well from about the mid right thigh to the right proximal ankle region with warmth as well. Neurologic:  Normal speech and language. No gross focal neurologic deficits are appreciated.  Skin:  Skin is warm, dry and intact. No rash noted. Psychiatric: Mood and affect are normal. Speech and behavior are normal.  ____________________________________________   LABS (all labs ordered are listed, but only abnormal results are displayed)  Labs Reviewed  CBC WITH  DIFFERENTIAL/PLATELET - Abnormal; Notable for the following components:      Result Value   RBC 3.63 (*)    Hemoglobin 9.8 (*)    HCT 31.5 (*)    RDW 23.8 (*)    All other components within normal limits  COMPREHENSIVE METABOLIC PANEL - Abnormal; Notable for the following components:   BUN 29 (*)    Creatinine, Ser 1.32 (*)    GFR calc non Af Amer 36 (*)    GFR calc Af Amer 41 (*)    All other components within normal limits  SARS CORONAVIRUS 2 (HOSPITAL ORDER, [place] [facility])  CULTURE, BLOOD (ROUTINE X 2)  CULTURE, BLOOD (ROUTINE X 2)  URINALYSIS, COMPLETE (UACMP) WITH MICROSCOPIC  VANCOMYCIN, RANDOM  BASIC METABOLIC PANEL   ____________________________________________  EKG   ____________________________________________  RADIOLOGY  Dg Forearm Left  Result Date: 11/10/2018 CLINICAL DATA:  [place] a few days ago. Bruising to the hands and forearms. EXAM: LEFT FOREARM - 2 VIEW COMPARISON:  None. FINDINGS: There is deformity at the wrist with dorsal angulation of the distal radial articular surface. The ulna projects more volar than expected for normal anatomic alignment. These findings are likely chronic due to old fractures. No convincing acute fracture of the forearm. Elbow joint is normally aligned. There is dorsal subcutaneous soft tissue edema. IMPRESSION: No acute fracture or dislocation. Electronically Signed   By: Lajean Manes M.D.   On: 11/10/2018 20:23   Dg Forearm Right  Result Date: 11/10/2018 CLINICAL DATA:  85 year old female with a history of fall EXAM: RIGHT FOREARM - 2 VIEW COMPARISON:  None. FINDINGS: Osteopenia. No acute displaced fracture of the radius or ulna. Degenerative changes at the wrist. No radiopaque foreign body. IMPRESSION: Negative for acute bony abnormality. Osteopenia Electronically Signed   By: Corrie Mckusick D.O.   On: 11/10/2018 20:16   Dg Hand Complete Left  Result Date: 11/10/2018 CLINICAL DATA:  [place] a few days ago.  Bruising to the hands and forearms. EXAM: LEFT HAND - COMPLETE 3+ VIEW COMPARISON:  None. FINDINGS: Oblique fracture of the fifth metacarpal extending from the volar midshaft to the dorsal ulnar aspect of the base. No fracture comminution. Approximately 3 mm of ulna displacement of the distal fracture component. Small corner fracture from the volar radial aspect of the base of the proximal phalanx of the middle finger, which could be acute or chronic.  No other fractures.  No bone lesions. Skeletal structures diffusely demineralized. There are arthropathic changes with joint space narrowing, involving the interphalangeal joints and the metacarpal phalangeal joints as well as the scaphoid, trapezium and trapezium first metacarpal articulations. There is volar subluxation of the proximal phalanges of the index and middle fingers in relation to the metacarpal heads. Findings may reflect combination of osteoarthritis as well as an inflammatory arthritis such as rheumatoid arthritis. IMPRESSION: 1. Acute minimally displaced fracture of the fifth metacarpal. 2. Small corner fracture from the radial volar base of the proximal phalanx of the left middle finger which may be acute or chronic. 3. No other fractures or acute findings. Electronically Signed   By: Amie Portland M.D.   On: 11/10/2018 20:20   Dg Hand Complete Right  Result Date: 11/10/2018 CLINICAL DATA:  85 year old female with fall and right hand pain. EXAM: RIGHT HAND - COMPLETE 3+ VIEW COMPARISON:  None. FINDINGS: There is no acute fracture or dislocation. There is severe osteopenia. Degenerative changes of the base of the thumb and interphalangeal joints. There are arthritic changes of the second and third MCP joint with mild chronic volar subluxation. The soft tissues are grossly unremarkable. IMPRESSION: 1. No acute fracture or dislocation. 2. Osteopenia with degenerative changes. 3. Erosive changes of the second and third MCP joints. Electronically  Signed   By: Elgie Collard M.D.   On: 11/10/2018 20:18    ____________________________________________   PROCEDURES  Procedure(s) performed: None  Procedures  Critical Care performed: No  ____________________________________________   INITIAL IMPRESSION / ASSESSMENT AND PLAN / ED COURSE  Pertinent labs & imaging results that were available during my care of the patient were reviewed by me and considered in my medical decision making (see chart for details).   Patient comes for evaluation for a fall and left hand fracture.  Did not injure her head or neck.  Reassuring clinical examination except she does have evidence of apparent likely bilateral lower extremity cellulitis with a couple of open wounds that I suspect have led to this.  She has a documented penicillin allergy and based on our algorithm for treatment will start her on vancomycin and Levaquin.  Additionally, will obtain x-rays of both hands and both forearms.  She has a report indicating negative left knee imaging from her facility  We will check lab test blood culture I am concerned about the bilateral nature of cellulitic changes involving the lower legs.  Clinical Course as of Nov 09 2341  Thu Nov 10, 2018  1943 My initial evaluation somewhat delayed due to emergent case prior   [MQ]    Clinical Course User Index [MQ] Sharyn Creamer, MD    Megan Donaldson was evaluated in Emergency Department on 11/10/2018 for the symptoms described in the history of present illness. She was evaluated in the context of the global COVID-19 pandemic, which necessitated consideration that the patient might be at risk for infection with the SARS-CoV-2 virus that causes COVID-19. Institutional protocols and algorithms that pertain to the evaluation of patients at risk for COVID-19 are in a state of rapid change based on information released by regulatory bodies including the CDC and federal and state organizations. These policies and  algorithms were followed during the patient's care in the ED.  ____________________________________________   FINAL CLINICAL IMPRESSION(S) / ED DIAGNOSES  Final diagnoses:  Closed displaced fracture of fifth metacarpal bone of left hand, unspecified portion of metacarpal, initial encounter  Closed nondisplaced fracture of proximal  phalanx of left middle finger, initial encounter  Bilateral lower leg cellulitis        Note:  This document was prepared using Dragon voice recognition software and may include unintentional dictation errors       Sharyn CreamerQuale, Mark, MD 11/10/18 2343

## 2018-11-10 NOTE — ED Notes (Signed)
Assessed multiple skin tears on Left lower extremity and Left upper arm. Pt has two skin tears on anterior left shin, each approximately 5cm x 6cm. Cleaned each with chlorhexidine soap, rinsed with normal saline and patted dry. Wounds dressed with telfa non-stick dressings and secured with paper tape. Pt tolerated procedure well.

## 2018-11-10 NOTE — ED Notes (Signed)
Awaiting EDP. 

## 2018-11-10 NOTE — ED Notes (Addendum)
ED TO INPATIENT HANDOFF REPORT  ED Nurse Name and Phone #:   Gershon Mussel RN   #[phone]  S Name/Age/Gender Megan Donaldson 85 y.o. female Room/Bed: ED26A/ED26A  Code Status   Code Status: DNR  Home/SNF/Other Nursing Home Patient oriented to: self, place and time Is this baseline? Yes   Triage Complete: Triage complete  Chief Complaint Hand Fracture  Triage Note Patient arrives from [facility] following a fall 2 days ago with significant bruising to L hand- facility xray showed fracture of metacarpal. Patient also has several skin tears on body, particularly concerning tear on L shin.   Allergies Allergies  Allergen Reactions  . Amoxicillin     Has patient had a PCN reaction causing immediate rash, facial/tongue/throat swelling, SOB or lightheadedness with hypotension: Unknown Has patient had a PCN reaction causing severe rash involving mucus membranes or skin necrosis: Unknown Has patient had a PCN reaction that required hospitalization: Unknown Has patient had a PCN reaction occurring within the last 10 years: Unknown If all of the above answers are "NO", then may proceed with Cephalosporin use.   . Latex   . Lisinopril     Level of Care/Admitting Diagnosis ED Disposition    ED Disposition Condition [facility] Area: [place] [[id number]]  Level of Care: Med-Surg [16]  Covid Evaluation: Confirmed COVID Negative  Diagnosis: Bilateral lower leg cellulitis [[id number]]  Admitting Physician: Lang Snow [[id number]]  Attending Physician: Rufina Falco ACHIENG [[id number]]  Estimated length of stay: past midnight tomorrow  Certification:: I certify this patient will need inpatient services for at least 2 midnights  PT Class (Do Not Modify): Inpatient [101]  PT Acc Code (Do Not Modify): Private [1]       B Medical/Surgery History Past Medical History:  Diagnosis Date  . CHF (congestive heart failure) ([place])   . Dementia ([place])   .  GERD (gastroesophageal reflux disease)   . Hyperlipidemia   . Hypertension   . Osteoporosis   . Personal history of other venous thrombosis and embolism   . Personal history of PE (pulmonary embolism)    Past Surgical History:  Procedure Laterality Date  . ESOPHAGOGASTRODUODENOSCOPY N/A 02/02/2018   Procedure: ESOPHAGOGASTRODUODENOSCOPY (EGD);  Surgeon: Toledo, Benay Pike, MD;  Location: [facility] ENDOSCOPY;  Service: Gastroenterology;  Laterality: N/A;  . HIP CLOSED REDUCTION Right 12/21/2014   Procedure: CLOSED REDUCTION HIP;  Surgeon: Earnestine Leys, MD;  Location: [facility] ORS;  Service: Orthopedics;  Laterality: Right;  . HIP CLOSED REDUCTION Right 09/01/2018   Procedure: CLOSED REDUCTION HIP;  Surgeon: Dereck Leep, MD;  Location: [facility] ORS;  Service: Orthopedics;  Laterality: Right;  . TOTAL HIP ARTHROPLASTY Right    x3     A IV Location/Drains/Wounds Patient Lines/Drains/Airways Status   Active Line/Drains/Airways    Name:   Placement date:   Placement time:   Site:   Days:   Peripheral IV 11/10/18 Right Antecubital   11/10/18    2025    Antecubital   less than 1   Peripheral IV 11/10/18 Right Wrist   11/10/18    2025    Wrist   less than 1   Incision (Closed) 08/31/18 Hip Right   08/31/18    2344     71          Intake/Output Last 24 hours  Intake/Output Summary (Last 24 hours) at 11/10/2018 2346 Last data filed at 11/10/2018 2206 Gross per 24 hour  Intake 142.91 ml  Output -  Net 142.91 ml    Labs/Imaging Results for orders placed or performed during the hospital encounter of 11/10/18 (from the past 48 hour(s))  CBC WITH DIFFERENTIAL     Status: Abnormal   Collection Time: 11/10/18  7:53 PM  Result Value Ref Range   WBC 6.6 4.0 - 10.5 K/uL   RBC 3.63 (L) 3.87 - 5.11 MIL/uL   Hemoglobin 9.8 (L) 12.0 - 15.0 g/dL   HCT 19.1 (L) 47.8 - 29.5 %   MCV 86.8 80.0 - 100.0 fL   MCH 27.0 26.0 - 34.0 pg   MCHC 31.1 30.0 - 36.0 g/dL   RDW 62.1 (H) 30.8 - 65.7 %   Platelets 246  150 - 400 K/uL   nRBC 0.0 0.0 - 0.2 %   Neutrophils Relative % 63 %   Neutro Abs 4.1 1.7 - 7.7 K/uL   Lymphocytes Relative 25 %   Lymphs Abs 1.7 0.7 - 4.0 K/uL   Monocytes Relative 10 %   Monocytes Absolute 0.7 0.1 - 1.0 K/uL   Eosinophils Relative 1 %   Eosinophils Absolute 0.1 0.0 - 0.5 K/uL   Basophils Relative 1 %   Basophils Absolute 0.0 0.0 - 0.1 K/uL   Immature Granulocytes 0 %   Abs Immature Granulocytes 0.02 0.00 - 0.07 K/uL   Polychromasia PRESENT    Spherocytes PRESENT     Comment: Performed at [facility], [address] [place], [place] [address]  Comprehensive metabolic panel     Status: Abnormal   Collection Time: 11/10/18  7:53 PM  Result Value Ref Range   Sodium 140 135 - 145 mmol/L   Potassium 4.2 3.5 - 5.1 mmol/L   Chloride 103 98 - 111 mmol/L   CO2 28 22 - 32 mmol/L   Glucose, Bld 88 70 - 99 mg/dL   BUN 29 (H) 8 - 23 mg/dL   Creatinine, Ser 2.95 (H) 0.44 - 1.00 mg/dL   Calcium 28.4 8.9 - 13.2 mg/dL   Total Protein 6.8 6.5 - 8.1 g/dL   Albumin 3.9 3.5 - 5.0 g/dL   AST 31 15 - 41 U/L   ALT 19 0 - 44 U/L   Alkaline Phosphatase 70 38 - 126 U/L   Total Bilirubin 0.7 0.3 - 1.2 mg/dL   GFR calc non Af Amer 36 (L) >60 mL/min   GFR calc Af Amer 41 (L) >60 mL/min   Anion gap 9 5 - 15    Comment: Performed at [facility], [address] [place], [place] [address]  SARS Coronavirus 2 [facility] order, Performed in [facility] hospital lab) Nasopharyngeal Nasopharyngeal Swab     Status: None   Collection Time: 11/10/18  7:55 PM   Specimen: Nasopharyngeal Swab  Result Value Ref Range   SARS Coronavirus 2 NEGATIVE NEGATIVE    Comment: (NOTE) If result is NEGATIVE SARS-CoV-2 target nucleic acids are NOT DETECTED. The SARS-CoV-2 RNA is generally detectable in upper and lower  respiratory specimens during the acute phase of infection. The lowest  concentration of SARS-CoV-2 viral copies this assay can detect is 250  copies / mL. A negative  result does not preclude SARS-CoV-2 infection  and should not be used as the sole basis for treatment or other  patient management decisions.  A negative result may occur with  improper specimen collection / handling, submission of specimen other  than nasopharyngeal swab, presence of viral mutation(s) within the  areas targeted by this assay, and  inadequate number of viral copies  (<250 copies / mL). A negative result must be combined with clinical  observations, patient history, and epidemiological information. If result is POSITIVE SARS-CoV-2 target nucleic acids are DETECTED. The SARS-CoV-2 RNA is generally detectable in upper and lower  respiratory specimens dur ing the acute phase of infection.  Positive  results are indicative of active infection with SARS-CoV-2.  Clinical  correlation with patient history and other diagnostic information is  necessary to determine patient infection status.  Positive results do  not rule out bacterial infection or co-infection with other viruses. If result is PRESUMPTIVE POSTIVE SARS-CoV-2 nucleic acids MAY BE PRESENT.   A presumptive positive result was obtained on the submitted specimen  and confirmed on repeat testing.  While 2019 novel coronavirus  (SARS-CoV-2) nucleic acids may be present in the submitted sample  additional confirmatory testing may be necessary for epidemiological  and / or clinical management purposes  to differentiate between  SARS-CoV-2 and other Sarbecovirus currently known to infect humans.  If clinically indicated additional testing with an alternate test  methodology [phone]([id number]) is advised. The SARS-CoV-2 RNA is generally  detectable in upper and lower respiratory sp ecimens during the acute  phase of infection. The expected result is Negative. Fact Sheet for Patients:  BoilerBrush.com.cyhttps://www.fda.gov/media/136312/download Fact Sheet for Healthcare Providers: https://pope.com/https://www.fda.gov/media/136313/download This test is not yet  approved or cleared by the [place] FDA and has been authorized for detection and/or diagnosis of SARS-CoV-2 by FDA under an Emergency Use Authorization (EUA).  This EUA will remain in effect (meaning this test can be used) for the duration of the COVID-19 declaration under Section 564(b)(1) of the Act, 21 U.S.C. section 360bbb-3(b)(1), unless the authorization is terminated or revoked sooner. Performed at [facility], [address] [place], [place] [address]    Dg Forearm Left  Result Date: 11/10/2018 CLINICAL DATA:  Larey SeatFell a few days ago. Bruising to the hands and forearms. EXAM: LEFT FOREARM - 2 VIEW COMPARISON:  None. FINDINGS: There is deformity at the wrist with dorsal angulation of the distal radial articular surface. The ulna projects more volar than expected for normal anatomic alignment. These findings are likely chronic due to old fractures. No convincing acute fracture of the forearm. Elbow joint is normally aligned. There is dorsal subcutaneous soft tissue edema. IMPRESSION: No acute fracture or dislocation. Electronically Signed   By: Amie Portlandavid  Ormond M.D.   On: 11/10/2018 20:23   Dg Forearm Right  Result Date: 11/10/2018 CLINICAL DATA:  85 year old female with a history of fall EXAM: RIGHT FOREARM - 2 VIEW COMPARISON:  None. FINDINGS: Osteopenia. No acute displaced fracture of the radius or ulna. Degenerative changes at the wrist. No radiopaque foreign body. IMPRESSION: Negative for acute bony abnormality. Osteopenia Electronically Signed   By: Gilmer MorJaime  Wagner D.O.   On: 11/10/2018 20:16   Dg Hand Complete Left  Result Date: 11/10/2018 CLINICAL DATA:  Larey SeatFell a few days ago. Bruising to the hands and forearms. EXAM: LEFT HAND - COMPLETE 3+ VIEW COMPARISON:  None. FINDINGS: Oblique fracture of the fifth metacarpal extending from the volar midshaft to the dorsal ulnar aspect of the base. No fracture comminution. Approximately 3 mm of ulna displacement of the distal fracture  component. Small corner fracture from the volar radial aspect of the base of the proximal phalanx of the middle finger, which could be acute or chronic. No other fractures.  No bone lesions. Skeletal structures diffusely demineralized. There are arthropathic changes with joint space narrowing,  involving the interphalangeal joints and the metacarpal phalangeal joints as well as the scaphoid, trapezium and trapezium first metacarpal articulations. There is volar subluxation of the proximal phalanges of the index and middle fingers in relation to the metacarpal heads. Findings may reflect combination of osteoarthritis as well as an inflammatory arthritis such as rheumatoid arthritis. IMPRESSION: 1. Acute minimally displaced fracture of the fifth metacarpal. 2. Small corner fracture from the radial volar base of the proximal phalanx of the left middle finger which may be acute or chronic. 3. No other fractures or acute findings. Electronically Signed   By: Amie Portland M.D.   On: 11/10/2018 20:20   Dg Hand Complete Right  Result Date: 11/10/2018 CLINICAL DATA:  85 year old female with fall and right hand pain. EXAM: RIGHT HAND - COMPLETE 3+ VIEW COMPARISON:  None. FINDINGS: There is no acute fracture or dislocation. There is severe osteopenia. Degenerative changes of the base of the thumb and interphalangeal joints. There are arthritic changes of the second and third MCP joint with mild chronic volar subluxation. The soft tissues are grossly unremarkable. IMPRESSION: 1. No acute fracture or dislocation. 2. Osteopenia with degenerative changes. 3. Erosive changes of the second and third MCP joints. Electronically Signed   By: Elgie Collard M.D.   On: 11/10/2018 20:18    Pending Labs Unresulted Labs (From admission, onward)    Start     Ordered   11/11/18 0500  Vancomycin, random  Tomorrow morning,   STAT     11/10/18 2341   11/11/18 0500  Basic metabolic panel  Tomorrow morning,   STAT     11/10/18 2341    11/10/18 1953  Blood Culture (routine x 2)  BLOOD CULTURE X 2,   STAT     11/10/18 1953   11/10/18 1953  Urinalysis, Complete w Microscopic  ONCE - STAT,   STAT     11/10/18 1953          Vitals/Pain Today's Vitals   11/10/18 1830 11/10/18 2039 11/10/18 2155 11/10/18 2300  BP: (!) 149/77 (!) 163/84 (!) 144/78 (!) 148/73  Pulse: (!) 56 61 (!) 59 (!) 55  Resp:  18 18 18   Temp:      TempSrc:      SpO2: 98% 99% 99% 97%  Weight:      Height:      PainSc:        Isolation Precautions No active isolations  Medications Medications  vancomycin (VANCOCIN) 1,500 mg in sodium chloride 0.9 % 500 mL IVPB (1,500 mg Intravenous New Bag/Given 11/10/18 2210)  metoprolol succinate (TOPROL-XL) 24 hr tablet 25 mg (has no administration in time range)  aspirin EC tablet 81 mg (has no administration in time range)  acetaminophen (TYLENOL) tablet 1,000 mg (has no administration in time range)  simvastatin (ZOCOR) tablet 40 mg (has no administration in time range)  escitalopram (LEXAPRO) tablet 10 mg (has no administration in time range)  traZODone (DESYREL) tablet 100 mg (has no administration in time range)  levothyroxine (SYNTHROID) tablet 75 mcg (has no administration in time range)  pantoprazole (PROTONIX) EC tablet 40 mg (has no administration in time range)  PEG 3350 PACK 17 g (has no administration in time range)  senna-docusate (Senokot-S) tablet 2 tablet (has no administration in time range)  ferrous sulfate tablet 325 mg (has no administration in time range)  white petrolatum (VASELINE) gel 1 application (has no administration in time range)  lamoTRIgine (LAMICTAL) tablet 37.5 mg (has no administration  in time range)  lamoTRIgine (LAMICTAL) tablet 50 mg (has no administration in time range)  Calcium Carbonate-Vitamin D3 600-400 MG-UNIT TABS 1 tablet (has no administration in time range)  neomycin-bacitracin-polymyxin (NEOSPORIN) ointment 1 application (has no administration in time  range)  vancomycin (VANCOCIN) IVPB 1000 mg/200 mL premix (has no administration in time range)  enoxaparin (LOVENOX) injection 40 mg (has no administration in time range)  levofloxacin (LEVAQUIN) IVPB 750 mg (0 mg Intravenous Stopped 11/10/18 2206)    Mobility walks with device High fall risk   Focused Assessments Cardiac Assessment Handoff:    Lab Results  Component Value Date   TROPONINI <0.03 09/17/2017   No results found for: DDIMER Does the Patient currently have chest pain? No     R Recommendations: See Admitting Provider Note  Report given to:  Viviann Spare RN on [facility]  Additional Notes:

## 2018-11-10 NOTE — ED Notes (Signed)
Fall bracelet and yellow socks applied

## 2018-11-10 NOTE — ED Triage Notes (Signed)
Patient arrives from [facility] following a fall 2 days ago with significant bruising to L hand- facility xray showed fracture of metacarpal. Patient also has several skin tears on body, particularly concerning tear on L shin.

## 2018-11-10 NOTE — Consult Note (Signed)
PHARMACY -  BRIEF ANTIBIOTIC NOTE   Pharmacy has received consult(s) for vancomycin from an ED provider.  The patient's profile has been reviewed for ht/wt/allergies/indication/available labs.    One time order(s) placed for vancomycin 1500 mg  Further antibiotics/pharmacy consults should be ordered by admitting physician if indicated.                       Thank you,  Dallie Piles, PharmD 11/10/2018  8:08 PM

## 2018-11-10 NOTE — Progress Notes (Signed)
Pharmacy Antibiotic Note  Megan Donaldson is a 85 y.o. female admitted on 11/10/2018 with multiple skin tears on LUE and LLE on shins.  Pharmacy has been consulted for vancomcyin dosing.  Plan: Patient received a dose of vanc 1.5g IV load in ED.  Patient is currently in AKI (not clear if she has CKD b/l Scr 0.9 - 1.16), will check a vanc random w/ BMP w/ am labs. Goal random < 20 mcg/mL. If patient's renal function stabilizes to baseline will consider scheduled regimen. Will re-dose vanc 15 mg/kg if below goal and continue to monitor.  Height: 5\' 3"  (160 cm) Weight: 197 lb (89.4 kg) IBW/kg (Calculated) : 52.4  Temp (24hrs), Avg:97.6 F (36.4 C), Min:97.6 F (36.4 C), Max:97.6 F (36.4 C)  Recent Labs  Lab 11/10/18 1953  WBC 6.6  CREATININE 1.32*    Estimated Creatinine Clearance: 30.7 mL/min (A) (by C-G formula based on SCr of 1.32 mg/dL (H)).    Allergies  Allergen Reactions  . Amoxicillin     Has patient had a PCN reaction causing immediate rash, facial/tongue/throat swelling, SOB or lightheadedness with hypotension: Unknown Has patient had a PCN reaction causing severe rash involving mucus membranes or skin necrosis: Unknown Has patient had a PCN reaction that required hospitalization: Unknown Has patient had a PCN reaction occurring within the last 10 years: Unknown If all of the above answers are "NO", then may proceed with Cephalosporin use.   . Latex   . Lisinopril     Thank you for allowing pharmacy to be a part of this patient's care.  Tobie Lords, PharmD, BCPS Clinical Pharmacist 11/10/2018 11:42 PM

## 2018-11-10 NOTE — ED Notes (Signed)
In and out cath performed to obtain urine sample. Pt changed into hospital gown, hygiene and peri care performed. Pt placed in large diaper. Pt re-positioned in bed and resting comfortably.

## 2018-11-11 ENCOUNTER — Inpatient Hospital Stay: Payer: Medicare Other

## 2018-11-11 ENCOUNTER — Other Ambulatory Visit: Payer: Self-pay

## 2018-11-11 LAB — BASIC METABOLIC PANEL
Anion gap: 11 (ref 5–15)
BUN: 27 mg/dL — ABNORMAL HIGH (ref 8–23)
CO2: 26 mmol/L (ref 22–32)
Calcium: 9.4 mg/dL (ref 8.9–10.3)
Chloride: 105 mmol/L (ref 98–111)
Creatinine, Ser: 1.32 mg/dL — ABNORMAL HIGH (ref 0.44–1.00)
GFR calc Af Amer: 41 mL/min — ABNORMAL LOW (ref >60)
GFR calc non Af Amer: 36 mL/min — ABNORMAL LOW (ref >60)
Glucose, Bld: 86 mg/dL (ref 70–99)
Potassium: 3.7 mmol/L (ref 3.5–5.1)
Sodium: 142 mmol/L (ref 135–145)

## 2018-11-11 LAB — URINALYSIS, COMPLETE (UACMP) WITH MICROSCOPIC
Bacteria, UA: NONE SEEN
Bilirubin Urine: NEGATIVE
Glucose, UA: NEGATIVE mg/dL
Hgb urine dipstick: NEGATIVE
Ketones, ur: NEGATIVE mg/dL
Leukocytes,Ua: NEGATIVE
Nitrite: NEGATIVE
Protein, ur: NEGATIVE mg/dL
Specific Gravity, Urine: 1.008 (ref 1.005–1.030)
Squamous Epithelial / HPF: NONE SEEN (ref 0–5)
pH: 6 (ref 5.0–8.0)

## 2018-11-11 LAB — VANCOMYCIN, RANDOM: Vancomycin Rm: 21

## 2018-11-11 MED ORDER — VANCOMYCIN HCL IN DEXTROSE 1-5 GM/200ML-% IV SOLN
1000.0000 mg | Freq: Once | INTRAVENOUS | Status: AC
  Administered 2018-11-11: 09:00:00 1000 mg via INTRAVENOUS
  Filled 2018-11-11: qty 200

## 2018-11-11 MED ORDER — VANCOMYCIN VARIABLE DOSE PER UNSTABLE RENAL FUNCTION (PHARMACIST DOSING): Status: DC

## 2018-11-11 MED ORDER — ENOXAPARIN SODIUM 40 MG/0.4ML ~~LOC~~ SOLN
40.0000 mg | SUBCUTANEOUS | Status: DC
  Administered 2018-11-11: 40 mg via SUBCUTANEOUS
  Filled 2018-11-11: qty 0.4

## 2018-11-11 MED ORDER — VANCOMYCIN HCL IN DEXTROSE 1-5 GM/200ML-% IV SOLN: 1000.0000 mg | INTRAVENOUS | Status: DC

## 2018-11-11 NOTE — Progress Notes (Signed)
Primary nurse performed rectal temp on pt with a reading of 93.2. Primary nurse paged and spoke to [place]. Orders received to apply warm blankets first. If no results in one hour try bear hugger. Primary nurse to continue to monitor.

## 2018-11-11 NOTE — Progress Notes (Signed)
[employer] - [place] at [facility]                                                                                                                                                                                  Patient Demographics   Megan Donaldson, is a 85 y.o. female, DOB - 1927-08-13, BMW:[id number]  Admit date - 11/10/2018   Admitting Physician Jimmye Norman, NP  Outpatient Primary MD for the patient is [facility]   LOS - 1  Subjective: Patient admitted with cellulitis and hypothermia. Patient's temperature is improved.    Review of Systems:   CONSTITUTIONAL: No documented fever. No fatigue, weakness. No weight gain, no weight loss.  EYES: No blurry or double vision.  ENT: No tinnitus. No postnasal drip. No redness of the oropharynx.  RESPIRATORY: No cough, no wheeze, no hemoptysis. No dyspnea.  CARDIOVASCULAR: No chest pain. No orthopnea. No palpitations. No syncope.  GASTROINTESTINAL: No nausea, no vomiting or diarrhea. No abdominal pain. No melena or hematochezia.  GENITOURINARY: No dysuria or hematuria.  ENDOCRINE: No polyuria or nocturia. No heat or cold intolerance.  HEMATOLOGY: No anemia.  Positive bruising. No bleeding.  INTEGUMENTARY: Bilateral lower extremity redness and erythema MUSCULOSKELETAL: No arthritis. No swelling. No gout.  NEUROLOGIC: No numbness, tingling, or ataxia. No seizure-type activity.  PSYCHIATRIC: No anxiety. No insomnia. No ADD.    Vitals:   Vitals:   11/11/18 0743 11/11/18 0811 11/11/18 0941 11/11/18 1205  BP:      Pulse:  62    Resp:      Temp: (!) 94.6 F (34.8 C)  (!) 96.3 F (35.7 C) (!) 96.5 F (35.8 C)  TempSrc: Rectal  Rectal Rectal  SpO2:      Weight:      Height:        Wt Readings from Last 3 Encounters:  11/10/18 89.4 kg  09/01/18 89 kg  03/16/18 86.2 kg     Intake/Output Summary (Last 24 hours) at 11/11/2018 1258 Last data filed at 11/11/2018 0913 Gross per 24 hour  Intake  762.91 ml  Output -  Net 762.91 ml    Physical Exam:   GENERAL: Pleasant-appearing in no apparent distress.  HEAD, EYES, EARS, NOSE AND THROAT: Atraumatic, normocephalic. Extraocular muscles are intact. Pupils equal and reactive to light. Sclerae anicteric. No conjunctival injection. No oro-pharyngeal erythema.  NECK: Supple. There is no jugular venous distention. No bruits, no lymphadenopathy, no thyromegaly.  HEART: Regular rate and rhythm,. No murmurs, no rubs, no clicks.  LUNGS: Clear to auscultation bilaterally. No rales or rhonchi. No wheezes.  ABDOMEN: Soft, flat, nontender, nondistended. Has good bowel sounds. No hepatosplenomegaly appreciated.  EXTREMITIES: No evidence of any cyanosis, clubbing, or peripheral edema.  +2 pedal and radial pulses bilaterally.  NEUROLOGIC: The patient is alert, awake, and oriented x3 with no focal motor or sensory deficits appreciated bilaterally.  SKIN: Bilateral lower extremity swelling and redness present skin abrasions present as well Psych: Not anxious, depressed LN: No inguinal LN enlargement    Antibiotics   Anti-infectives (From admission, onward)   Start     Dose/Rate Route Frequency Ordered Stop   11/13/18 1000  vancomycin (VANCOCIN) IVPB 1000 mg/200 mL premix     1,000 mg 200 mL/hr over 60 Minutes Intravenous Every 48 hours 11/11/18 1019     11/11/18 0700  vancomycin (VANCOCIN) IVPB 1000 mg/200 mL premix     1,000 mg 200 mL/hr over 60 Minutes Intravenous  Once 11/11/18 0651 11/11/18 0945   11/11/18 0323  vancomycin variable dose per unstable renal function (pharmacist dosing)  Status:  Discontinued      Does not apply See admin instructions 11/11/18 0323 11/11/18 1019   11/10/18 2315  vancomycin (VANCOCIN) IVPB 1000 mg/200 mL premix  Status:  Discontinued     1,000 mg 200 mL/hr over 60 Minutes Intravenous  Once 11/10/18 2311 11/11/18 0012   11/10/18 2100  vancomycin (VANCOCIN) 1,500 mg in sodium chloride 0.9 % 500 mL IVPB     1,500  mg 250 mL/hr over 120 Minutes Intravenous  Once 11/10/18 2007 11/11/18 0010   11/10/18 2000  vancomycin (VANCOCIN) IVPB 1000 mg/200 mL premix  Status:  Discontinued     1,000 mg 200 mL/hr over 60 Minutes Intravenous  Once 11/10/18 1953 11/10/18 2007   11/10/18 2000  levofloxacin (LEVAQUIN) IVPB 750 mg     750 mg 100 mL/hr over 90 Minutes Intravenous  Once 11/10/18 1953 11/10/18 2206      Medications   Scheduled Meds: . acetaminophen  1,000 mg Oral BID  . aspirin EC  81 mg Oral Daily  . calcium-vitamin D  1 tablet Oral BID  . enoxaparin (LOVENOX) injection  40 mg Subcutaneous Q24H  . escitalopram  10 mg Oral Daily  . ferrous sulfate  325 mg Oral Q breakfast  . lamoTRIgine  37.5 mg Oral Daily  . lamoTRIgine  50 mg Oral QHS  . levothyroxine  75 mcg Oral Daily  . metoprolol succinate  25 mg Oral Daily  . neomycin-bacitracin-polymyxin  1 application Topical Daily  . pantoprazole  40 mg Oral BID  . senna-docusate  2 tablet Oral QHS  . simvastatin  40 mg Oral QHS  . traZODone  100 mg Oral QHS  . white petrolatum  1 application Topical TID   Continuous Infusions: . [START ON 11/13/2018] vancomycin     PRN Meds:.polyethylene glycol   Data Review:   Micro Results Recent Results (from the past 240 hour(s))  SARS Coronavirus 2 [facility](Hospital order, Per[facility]) Nasopharyngeal Nasopharyngeal Swab     Status: None   Collection Time: 11/10/18  7:55 PM   Specimen: Nasopharyngeal Swab  Result Value Ref Range Status   SARS Coronavirus 2 NEGATIVE NEGATIVE Final    Comment: (NOTE) If result is NEGATIVE SARS-CoV-2 target nucleic acids are NOT DETECTED. The SARS-CoV-2 RNA is generally detectable in upper and lower  respiratory specimens during the acute phase of infection. The lowest  concentration of SARS-CoV-2 viral copies this assay can detect is 250  copies / mL. A negative result does not preclude SARS-CoV-2 infection  and should not be used as the  sole basis  for treatment or other  patient management decisions.  A negative result may occur with  improper specimen collection / handling, submission of specimen other  than nasopharyngeal swab, presence of viral mutation(s) within the  areas targeted by this assay, and inadequate number of viral copies  (<250 copies / mL). A negative result must be combined with clinical  observations, patient history, and epidemiological information. If result is POSITIVE SARS-CoV-2 target nucleic acids are DETECTED. The SARS-CoV-2 RNA is generally detectable in upper and lower  respiratory specimens dur ing the acute phase of infection.  Positive  results are indicative of active infection with SARS-CoV-2.  Clinical  correlation with patient history and other diagnostic information is  necessary to determine patient infection status.  Positive results do  not rule out bacterial infection or co-infection with other viruses. If result is PRESUMPTIVE POSTIVE SARS-CoV-2 nucleic acids MAY BE PRESENT.   A presumptive positive result was obtained on the submitted specimen  and confirmed on repeat testing.  While 2019 novel coronavirus  (SARS-CoV-2) nucleic acids may be present in the submitted sample  additional confirmatory testing may be necessary for epidemiological  and / or clinical management purposes  to differentiate between  SARS-CoV-2 and other Sarbecovirus currently known to infect humans.  If clinically indicated additional testing with an alternate test  methodology [phone]) is advised. The SARS-CoV-2 RNA is generally  detectable in upper and lower respiratory sp ecimens during the acute  phase of infection. The expected result is Negative. Fact Sheet for Patients:  BoilerBrush.com.cy Fact Sheet for Healthcare Providers: https://pope.com/ This test is not yet approved or cleared by the [place] FDA and has been authorized for detection and/or  diagnosis of SARS-CoV-2 by FDA under an Emergency Use Authorization (EUA).  This EUA will remain in effect (meaning this test can be used) for the duration of the COVID-19 declaration under Section 564(b)(1) of the Act, 21 U.S.C. section 360bbb-3(b)(1), unless the authorization is terminated or revoked sooner. Performed at [facility], [address] [place], [place] [address]   Blood Culture (routine x 2)     Status: None (Preliminary result)   Collection Time: 11/10/18  8:23 PM   Specimen: BLOOD  Result Value Ref Range Status   Specimen Description BLOOD BLOOD RIGHT WRIST  Final   Special Requests   Final    BOTTLES DRAWN AEROBIC AND ANAEROBIC Blood Culture adequate volume   Culture   Final    NO GROWTH < 12 HOURS Performed at [facility], [address], [place], [place] [address]    Report Status PENDING  Incomplete  Blood Culture (routine x 2)     Status: None (Preliminary result)   Collection Time: 11/10/18  8:23 PM   Specimen: BLOOD  Result Value Ref Range Status   Specimen Description BLOOD RIGHT ANTECUBITAL  Final   Special Requests   Final    BOTTLES DRAWN AEROBIC AND ANAEROBIC Blood Culture adequate volume   Culture   Final    NO GROWTH < 12 HOURS Performed at [facility], [address], [place], [place] [address]    Report Status PENDING  Incomplete    Radiology Reports Dg Forearm Left  Result Date: 11/10/2018 CLINICAL DATA:  [place] a few days ago. Bruising to the hands and forearms. EXAM: LEFT FOREARM - 2 VIEW COMPARISON:  None. FINDINGS: There is deformity at the wrist with dorsal angulation of the distal radial articular surface. The ulna projects more  volar than expected for normal anatomic alignment. These findings are likely chronic due to old fractures. No convincing acute fracture of the forearm. Elbow joint is normally aligned. There is dorsal subcutaneous soft tissue edema. IMPRESSION: No acute fracture or dislocation.  Electronically Signed   By: Lajean Manes M.D.   On: 11/10/2018 20:23   Dg Forearm Right  Result Date: 11/10/2018 CLINICAL DATA:  85 year old female with a history of fall EXAM: RIGHT FOREARM - 2 VIEW COMPARISON:  None. FINDINGS: Osteopenia. No acute displaced fracture of the radius or ulna. Degenerative changes at the wrist. No radiopaque foreign body. IMPRESSION: Negative for acute bony abnormality. Osteopenia Electronically Signed   By: Corrie Mckusick D.O.   On: 11/10/2018 20:16   US Venous Img Lower Bilateral  Result Date: 11/11/2018 CLINICAL DATA:  85 year old female with bilateral lower extremity edema EXAM: BILATERAL LOWER EXTREMITY VENOUS DOPPLER ULTRASOUND TECHNIQUE: Gray-scale sonography with graded compression, as well as color Doppler and duplex ultrasound were performed to evaluate the lower extremity deep venous systems from the level of the common femoral vein and including the common femoral, femoral, profunda femoral, popliteal and calf veins including the posterior tibial, peroneal and gastrocnemius veins when visible. The superficial great saphenous vein was also interrogated. Spectral Doppler was utilized to evaluate flow at rest and with distal augmentation maneuvers in the common femoral, femoral and popliteal veins. COMPARISON:  None. FINDINGS: RIGHT LOWER EXTREMITY Common Femoral Vein: No evidence of thrombus. Normal compressibility, respiratory phasicity and response to augmentation. Saphenofemoral Junction: No evidence of thrombus. Normal compressibility and flow on color Doppler imaging. Profunda Femoral Vein: No evidence of thrombus. Normal compressibility and flow on color Doppler imaging. Femoral Vein: No evidence of thrombus. Normal compressibility, respiratory phasicity and response to augmentation. Popliteal Vein: No evidence of thrombus. Normal compressibility, respiratory phasicity and response to augmentation. Calf Veins: No evidence of thrombus. Normal compressibility and  flow on color Doppler imaging. Superficial Great Saphenous Vein: No evidence of thrombus. Normal compressibility. Venous Reflux:  None. Other Findings: Small fluid collection in the popliteal fossa measures 1.9 x 1.0 x 2.6 cm. Imaging findings are most consistent with a Baker's cyst. LEFT LOWER EXTREMITY Common Femoral Vein: No evidence of thrombus. Normal compressibility, respiratory phasicity and response to augmentation. Saphenofemoral Junction: No evidence of thrombus. Normal compressibility and flow on color Doppler imaging. Profunda Femoral Vein: No evidence of thrombus. Normal compressibility and flow on color Doppler imaging. Femoral Vein: No evidence of thrombus. Normal compressibility, respiratory phasicity and response to augmentation. Popliteal Vein: No evidence of thrombus. Normal compressibility, respiratory phasicity and response to augmentation. Calf Veins: No evidence of thrombus. Normal compressibility and flow on color Doppler imaging. Superficial Great Saphenous Vein: No evidence of thrombus. Normal compressibility. Venous Reflux:  None. Other Findings:  None. IMPRESSION: No evidence of deep venous thrombosis in either lower extremity. Small right-sided Baker's cyst. Electronically Signed   By: Jacqulynn Cadet M.D.   On: 11/11/2018 11:13   Dg Hand Complete Left  Result Date: 11/10/2018 CLINICAL DATA:  [place] a few days ago. Bruising to the hands and forearms. EXAM: LEFT HAND - COMPLETE 3+ VIEW COMPARISON:  None. FINDINGS: Oblique fracture of the fifth metacarpal extending from the volar midshaft to the dorsal ulnar aspect of the base. No fracture comminution. Approximately 3 mm of ulna displacement of the distal fracture component. Small corner fracture from the volar radial aspect of the base of the proximal phalanx of the middle finger, which could be acute or chronic. No  other fractures.  No bone lesions. Skeletal structures diffusely demineralized. There are arthropathic changes with  joint space narrowing, involving the interphalangeal joints and the metacarpal phalangeal joints as well as the scaphoid, trapezium and trapezium first metacarpal articulations. There is volar subluxation of the proximal phalanges of the index and middle fingers in relation to the metacarpal heads. Findings may reflect combination of osteoarthritis as well as an inflammatory arthritis such as rheumatoid arthritis. IMPRESSION: 1. Acute minimally displaced fracture of the fifth metacarpal. 2. Small corner fracture from the radial volar base of the proximal phalanx of the left middle finger which may be acute or chronic. 3. No other fractures or acute findings. Electronically Signed   By: Amie Portland M.D.   On: 11/10/2018 20:20   Dg Hand Complete Right  Result Date: 11/10/2018 CLINICAL DATA:  85 year old female with fall and right hand pain. EXAM: RIGHT HAND - COMPLETE 3+ VIEW COMPARISON:  None. FINDINGS: There is no acute fracture or dislocation. There is severe osteopenia. Degenerative changes of the base of the thumb and interphalangeal joints. There are arthritic changes of the second and third MCP joint with mild chronic volar subluxation. The soft tissues are grossly unremarkable. IMPRESSION: 1. No acute fracture or dislocation. 2. Osteopenia with degenerative changes. 3. Erosive changes of the second and third MCP joints. Electronically Signed   By: Elgie Collard M.D.   On: 11/10/2018 20:18     CBC Recent Labs  Lab 11/10/18 1953  WBC 6.6  HGB 9.8*  HCT 31.5*  PLT 246  MCV 86.8  MCH 27.0  MCHC 31.1  RDW 23.8*  LYMPHSABS 1.7  MONOABS 0.7  EOSABS 0.1  BASOSABS 0.0    Chemistries  Recent Labs  Lab 11/10/18 1953 11/11/18 0352  NA 140 142  K 4.2 3.7  CL 103 105  CO2 28 26  GLUCOSE 88 86  BUN 29* 27*  CREATININE 1.32* 1.32*  CALCIUM 10.0 9.4  AST 31  --   ALT 19  --   ALKPHOS 70  --   BILITOT 0.7  --     ------------------------------------------------------------------------------------------------------------------ estimated creatinine clearance is 30.7 mL/min (A) (by C-G formula based on SCr of 1.32 mg/dL (H)). ------------------------------------------------------------------------------------------------------------------ No results for input(s): HGBA1C in the last 72 hours. ------------------------------------------------------------------------------------------------------------------ No results for input(s): CHOL, HDL, LDLCALC, TRIG, CHOLHDL, LDLDIRECT in the last 72 hours. ------------------------------------------------------------------------------------------------------------------ No results for input(s): TSH, T4TOTAL, T3FREE, THYROIDAB in the last 72 hours.  Invalid input(s): FREET3 ------------------------------------------------------------------------------------------------------------------ No results for input(s): VITAMINB12, FOLATE, FERRITIN, TIBC, IRON, RETICCTPCT in the last 72 hours.  Coagulation profile No results for input(s): INR, PROTIME in the last 168 hours.  No results for input(s): DDIMER in the last 72 hours.  Cardiac Enzymes No results for input(s): CKMB, TROPONINI, MYOGLOBIN in the last 168 hours.  Invalid input(s): CK ------------------------------------------------------------------------------------------------------------------ Invalid input(s): POCBNP    Assessment & Plan   85 y.o. female female with pertinent past medical history of GI bleed, dementia, seizures, GERD, DVT, PE, right hip replacement, recurrent falls, hypothyroidism, hypertension, hyperlipidemia, and CHF presenting to the ED from [facility] assisted living due to falls with significant bruising to bilateral hands left greater than right  1. Bilateral lower extremity cellulitis -patient with recurrent falls and bruising - Discontinue vancomycin will start Unasyn   2.  Displaced fracture left hand -secondary to mechanical fall - Seen by Orthopedic Dr. Salomon Mast  3. Acute on chronic CKD -creatinine slightly elevated above baseline - Hold nephrotoxins - Continue to monitor renal  function  4. Seizures -no new seizure-like activity - Continue Lamictal  5. HTN  + Goal BP <130/80 - Continue metoprolol  6. HLD  - Simvastatin  PO qhs  7.  Hypothyroidism -continue Synthroid  8. DVT prophylaxis - Enoxaparin SubQ monitor for worsening  Bruising/hematoma      Code Status Orders  (From admission, onward)         Start     Ordered   11/10/18 2310  Do not attempt resuscitation (DNR)  Continuous    Question Answer Comment  In the event of cardiac or respiratory ARREST Do not call a "code blue"   In the event of cardiac or respiratory ARREST Do not perform Intubation, CPR, defibrillation or ACLS   In the event of cardiac or respiratory ARREST Use medication by any route, position, wound care, and other measures to relive pain and suffering. May use oxygen, suction and manual treatment of airway obstruction as needed for comfort.      11/10/18 2311        Code Status History    Date Active Date Inactive Code Status Order ID Comments User Context   09/01/2018 0231 09/01/2018 2045 DNR [id number]  Donato Heinz, MD Inpatient   02/01/2018 2107 02/03/2018 2129 DNR [id number]  Ihor Austin, MD Inpatient   09/17/2017 2300 09/19/2017 1904 DNR [id number]  Cammy Copa, MD Inpatient   08/07/2017 1553 08/08/2017 1938 DNR [id number]  Milagros Loll, MD ED   08/07/2017 1528 08/07/2017 1553 DNR [id number]  Willy Eddy, MD ED   Advance Care Planning Activity    Advance Directive Documentation     Most Recent Value  Type of Advance Directive  Out of facility DNR (pink MOST or yellow form)  Pre-existing out of facility DNR order (yellow form or pink MOST form)  Pink MOST form placed in chart (order not valid for inpatient use)  "MOST" Form in Place?  -            Consults orthopedic  DVT Prophylaxis  Lovenox Lab Results  Component Value Date   PLT 246 11/10/2018     Time Spent in minutes   Greater than 50% of time spent in care coordination and counseling patient regarding the condition and plan of care.   Auburn Bilberry M.D on 11/11/2018 at 12:58 PM  Between 7am to 6pm - Pager - [phone]  After 6pm go to [email or web address] - Social research officer, government  [employer]   Office  [phone]

## 2018-11-11 NOTE — Progress Notes (Signed)
PHARMACIST - PHYSICIAN COMMUNICATION  CONCERNING:  Enoxaparin (Lovenox) for DVT Prophylaxis   RECOMMENDATION: Patient was prescribed enoxaprin 30mg  q24 hours for VTE prophylaxis.   Filed Weights   11/10/18 1811  Weight: 197 lb (89.4 kg)    Body mass index is 34.9 kg/m.  Estimated Creatinine Clearance: 30.7 mL/min (A) (by C-G formula based on SCr of 1.32 mg/dL (H)).  Patient is candidate for enoxaparin 40mg  every 24 hours based on CrCl >42ml/min AND Weight > 45kg   DESCRIPTION: Pharmacy has adjusted enoxaparin dose per [facility] policy.  Patient is now receiving enoxaparin 40mg  every 24 hours.   Lu Duffel, PharmD, BCPS Clinical Pharmacist 11/11/2018 10:22 AM

## 2018-11-11 NOTE — H&P (Signed)
[facility] [place] [facility]   PATIENT NAME: Megan Donaldson    MR#:  [id number]  DATE OF BIRTH:  [birth year]  DATE OF ADMISSION:  11/10/2018  PRIMARY CARE PHYSICIAN: [facility]   REQUESTING/REFERRING PHYSICIAN: Sharyn CreamerQuale, Mark, MD  CHIEF COMPLAINT:   Chief Complaint  Patient presents with   Hand Pain    HISTORY OF PRESENT ILLNESS:  85 y.o. female with pertinent past medical history of GI bleed, dementia, seizures, GERD, DVT, PE, right hip replacement, recurrent falls, hypothyroidism, hypertension, hyperlipidemia, and CHF [facility] ED [facility] due to falls with significant bruising to bilateral hands left greater than right.  Patient is not a good historian due to underlying dementia, history therefore obtained from patient's chart.  Per ED reports, patient had a fall 3 days ago and was brought in a.m. after x-ray done in the facility showed fracture of fifth metacarpal.  Unclear cause of her falls.  She was also noted to have several skin bruising, lesions anterior and bilateral cellulitis to lower legs.    On arrival to the ED, she was afebrile with blood pressure 147/68 mm Hg and pulse rate 57 beats/min. There were no focal neurological deficits; she was alert and oriented to person and place only.  Initial labs revealed unremarkable CBC, BUN 29, creatinine 1.32, COVID 19 neg. x-ray of left hand showed acute displaced fracture of the fifth metacarpal and proximal phalanx of the left middle finger.  Hospitalist asked to admit for further management.  PAST MEDICAL HISTORY:   Past Medical History:  Diagnosis Date   CHF (congestive heart failure) (HCC)    Dementia (HCC)    GERD (gastroesophageal reflux disease)    Hyperlipidemia    Hypertension    Osteoporosis    Personal history of other venous thrombosis and embolism    Personal history of PE (pulmonary embolism)     PAST SURGICAL HISTORY:   Past  Surgical History:  Procedure Laterality Date   ESOPHAGOGASTRODUODENOSCOPY N/A 02/02/2018   Procedure: ESOPHAGOGASTRODUODENOSCOPY (EGD);  Surgeon: Toledo, Boykin Nearingeodoro K, MD;  Location: [facility] ENDOSCOPY;  Service: Gastroenterology;  Laterality: N/A;   HIP CLOSED REDUCTION Right 12/21/2014   Procedure: CLOSED REDUCTION HIP;  Surgeon: Deeann SaintHoward Miller, MD;  Location: [facility] ORS;  Service: Orthopedics;  Laterality: Right;   HIP CLOSED REDUCTION Right 09/01/2018   Procedure: CLOSED REDUCTION HIP;  Surgeon: Donato HeinzHooten, James P, MD;  Location: [facility] ORS;  Service: Orthopedics;  Laterality: Right;   TOTAL HIP ARTHROPLASTY Right    x3    SOCIAL HISTORY:   Social History   Tobacco Use   Smoking status: Never Smoker   Smokeless tobacco: Never Used  Substance Use Topics   Alcohol use: No    FAMILY HISTORY:   Family History  Problem Relation Age of Onset   CAD Father     DRUG ALLERGIES:   Allergies  Allergen Reactions   Amoxicillin     Has patient had a PCN reaction causing immediate rash, facial/tongue/throat swelling, SOB or lightheadedness with hypotension: Unknown Has patient had a PCN reaction causing severe rash involving mucus membranes or skin necrosis: Unknown Has patient had a PCN reaction that required hospitalization: Unknown Has patient had a PCN reaction occurring within the last 10 years: Unknown If all of the above answers are "NO", then may proceed with Cephalosporin use.    Latex    Lisinopril     REVIEW OF SYSTEMS:   ROS Unable to  obtain due to underlying dementia  MEDICATIONS AT HOME:   Prior to Admission medications   Medication Sig Start Date End Date Taking? Authorizing Provider  acetaminophen (TYLENOL) 500 MG tablet Take 1,000 mg by mouth 2 (two) times daily.    Yes [provider]  aspirin EC 81 MG tablet Take 81 mg by mouth daily.   Yes [provider]  Calcium Carbonate-Vitamin D3 (CALCIUM 600-D) 600-400 MG-UNIT TABS Take 1 tablet by  mouth 2 (two) times daily.   Yes [provider]  escitalopram (LEXAPRO) 10 MG tablet Take 10 mg by mouth daily. 07/21/17  Yes [provider]  Ferrous Sulfate 142 (45 Fe) MG TBCR Take 45 mg by mouth daily.   Yes [provider]  furosemide (LASIX) 20 MG tablet Take 20 mg by mouth every other day.   Yes [provider]  lamoTRIgine (LAMICTAL) 25 MG tablet Take 37.5 mg by mouth daily.  01/17/18 01/17/19 Yes [provider]  lamoTRIgine (LAMICTAL) 25 MG tablet Take 50 mg by mouth at bedtime.   Yes [provider]  levothyroxine (SYNTHROID, LEVOTHROID) 75 MCG tablet Take 75 mcg by mouth daily.   Yes [provider]  metoprolol succinate (TOPROL-XL) 25 MG 24 hr tablet Take 1 tablet (25 mg total) by mouth daily. 09/20/17  Yes Loletha Grayer, MD  neomycin-bacitracin-polymyxin (NEOSPORIN) OINT Apply 1 application topically daily. (apply to left leg)   Yes [provider]  pantoprazole (PROTONIX) 40 MG tablet Take 40 mg by mouth 2 (two) times daily.   Yes [provider]  Polyethylene Glycol 3350 (PEG 3350) 17 g PACK Take 17 g by mouth daily as needed (moderate constipation).    Yes [provider]  senna-docusate (SENOKOT-S) 8.6-50 MG tablet Take 2 tablets by mouth at bedtime.   Yes [provider]  simvastatin (ZOCOR) 40 MG tablet Take 40 mg by mouth at bedtime. 05/04/17  Yes [provider]  traZODone (DESYREL) 100 MG tablet Take 100 mg by mouth at bedtime.   Yes [provider]  white petrolatum (VASELINE) GEL Apply 1 application topically 3 (three) times daily. Apply to lips   Yes [provider]      VITAL SIGNS:  Blood pressure 139/77, pulse (!) 54, temperature 97.6 F (36.4 C), temperature source Oral, resp. rate 20, height 5\' 3"  (1.6 m), weight 89.4 kg, SpO2 99 %.  PHYSICAL EXAMINATION:   Physical Exam  GENERAL:  85 y.o.-year-old patient lying in the bed with no acute  distress.  EYES: Pupils equal, round, reactive to light and accommodation. No scleral icterus. Extraocular muscles intact.  HEENT: Head atraumatic, normocephalic. Oropharynx and nasopharynx clear.  NECK:  Supple, no jugular venous distention. No thyroid enlargement, no tenderness.  LUNGS: Normal breath sounds bilaterally, no wheezing, rales,rhonchi or crepitation. No use of accessory muscles of respiration.  CARDIOVASCULAR: S1, S2 normal. No murmurs, rubs, or gallops.  ABDOMEN: Soft, nontender, nondistended. Bowel sounds present. No organomegaly or mass.  EXTREMITIES: No pedal edema, cyanosis, or clubbing.  NEUROLOGIC: Cranial nerves II through XII are intact. Muscle strength 5/5 in all extremities. Sensation intact. Gait not checked.  PSYCHIATRIC: The patient is alert and oriented x 3.  SKIN: Multiple skin bruising, lesion, ulcers and discoloration as below         DATA REVIEWED:  LABORATORY PANEL:   CBC Recent Labs  Lab 11/10/18 1953  WBC 6.6  HGB 9.8*  HCT 31.5*  PLT 246   ------------------------------------------------------------------------------------------------------------------  Chemistries  Recent Labs  Lab 11/10/18 1953  NA 140  K 4.2  CL 103  CO2 28  GLUCOSE 88  BUN 29*  CREATININE 1.32*  CALCIUM 10.0  AST 31  ALT 19  ALKPHOS 70  BILITOT 0.7   ------------------------------------------------------------------------------------------------------------------  Cardiac Enzymes No results for input(s): TROPONINI in the last 168 hours. ------------------------------------------------------------------------------------------------------------------  RADIOLOGY:  Dg Forearm Left  Result Date: 11/10/2018 CLINICAL DATA:  Larey Seat a few days ago. Bruising to the hands and forearms. EXAM: LEFT FOREARM - 2 VIEW COMPARISON:  None. FINDINGS: There is deformity at the wrist with dorsal angulation of the distal radial articular surface. The ulna projects more  volar than expected for normal anatomic alignment. These findings are likely chronic due to old fractures. No convincing acute fracture of the forearm. Elbow joint is normally aligned. There is dorsal subcutaneous soft tissue edema. IMPRESSION: No acute fracture or dislocation. Electronically Signed   By: Amie Portland M.D.   On: 11/10/2018 20:23   Dg Forearm Right  Result Date: 11/10/2018 CLINICAL DATA:  85 year old female with a history of fall EXAM: RIGHT FOREARM - 2 VIEW COMPARISON:  None. FINDINGS: Osteopenia. No acute displaced fracture of the radius or ulna. Degenerative changes at the wrist. No radiopaque foreign body. IMPRESSION: Negative for acute bony abnormality. Osteopenia Electronically Signed   By: Gilmer Mor D.O.   On: 11/10/2018 20:16   Dg Hand Complete Left  Result Date: 11/10/2018 CLINICAL DATA:  Larey Seat a few days ago. Bruising to the hands and forearms. EXAM: LEFT HAND - COMPLETE 3+ VIEW COMPARISON:  None. FINDINGS: Oblique fracture of the fifth metacarpal extending from the volar midshaft to the dorsal ulnar aspect of the base. No fracture comminution. Approximately 3 mm of ulna displacement of the distal fracture component. Small corner fracture from the volar radial aspect of the base of the proximal phalanx of the middle finger, which could be acute or chronic. No other fractures.  No bone lesions. Skeletal structures diffusely demineralized. There are arthropathic changes with joint space narrowing, involving the interphalangeal joints and the metacarpal phalangeal joints as well as the scaphoid, trapezium and trapezium first metacarpal articulations. There is volar subluxation of the proximal phalanges of the index and middle fingers in relation to the metacarpal heads. Findings may reflect combination of osteoarthritis as well as an inflammatory arthritis such as rheumatoid arthritis. IMPRESSION: 1. Acute minimally displaced fracture of the fifth metacarpal. 2. Small corner  fracture from the radial volar base of the proximal phalanx of the left middle finger which may be acute or chronic. 3. No other fractures or acute findings. Electronically Signed   By: Amie Portland M.D.   On: 11/10/2018 20:20   Dg Hand Complete Right  Result Date: 11/10/2018 CLINICAL DATA:  85 year old female with fall and right hand pain. EXAM: RIGHT HAND - COMPLETE 3+ VIEW COMPARISON:  None. FINDINGS: There is no acute fracture or dislocation. There is severe osteopenia. Degenerative changes of the base of the thumb and interphalangeal joints. There are arthritic changes of the second and third MCP joint with mild chronic volar subluxation. The soft tissues are grossly unremarkable. IMPRESSION: 1. No acute fracture or dislocation. 2. Osteopenia with degenerative changes. 3. Erosive changes of the second and third MCP joints. Electronically Signed   By: Elgie Collard M.D.   On: 11/10/2018 20:18    EKG:  EKG: there are no previous tracings available.  IMPRESSION AND PLAN:   85 y.o. female female with pertinent past medical history  of GI bleed, dementia, seizures, GERD, DVT, PE, right hip replacement, recurrent falls, hypothyroidism, hypertension, hyperlipidemia, and CHF presenting to the ED from [facility] assisted living due to falls with significant bruising to bilateral hands left greater than right  1. Bilateral lower extremity cellulitis -patient with recurrent falls and bruising - Admit to MedSurg unit - Blood cultures pending - Empiric antibiotics with vancomycin pending cultures - We will obtain ultrasound lower extremities eval for DVT - Wound consult placed  2. Displaced fracture left hand -secondary to mechanical fall - Apply splint - Orthopedic consult, message sent via Ha[facility] to Dr. Salomon Mast  3. Acute on chronic CKD -creatinine slightly elevated above baseline - Hold nephrotoxins - Continue to monitor renal function  4. Seizures -no new seizure-like activity - Continue  Lamictal  5. HTN  + Goal BP <130/80 - Continue metoprolol  6. HLD  - Simvastatin 40mg  PO qhs  7.  Hypothyroidism -continue Synthroid  8. DVT prophylaxis - Enoxaparin SubQ monitor for worsening  Bruising/hematoma   All the records are reviewed and case discussed with ED provider. Management plans discussed with the patient, family and they are in agreement.  CODE STATUS: DNR  TOTAL TIME TAKING CARE OF THIS PATIENT: 50 minutes.    on 11/11/2018 at 2:19 AM   11/13/2018, DNP, FNP-BC [facility] Nurse Practitioner Between 7am to 6pm - Pager [phone]  After 6pm go to [email or web address] - - [phone]  [facility]  Office  [phone]  CC: Primary care physician; [facility]

## 2018-11-11 NOTE — Progress Notes (Signed)
Pharmacy Antibiotic Note  Megan Donaldson is a 85 y.o. female admitted on 11/10/2018 with multiple skin tears on LUE and LLE on shins.  Pharmacy has been consulted for vancomcyin dosing.  Vancomycin Dose Hx: 9/24 2210 1500mg  9/25 0352 VR 21ug/ml 9/25 0845 1000mg   Plan: Vancomycin initially dosed per level, will now schedule as renal function seems to have stabilized (Scr 1.32 x 2)  Vancomycin 1000 mg IV Q 48 hrs. Goal AUC 400-550. Expected AUC: 461.5 SCr used: 1.32  Height: 5\' 3"  (160 cm) Weight: 197 lb (89.4 kg) IBW/kg (Calculated) : 52.4  Temp (24hrs), Avg:95 F (35 C), Min:93.2 F (34 C), Max:97.6 F (36.4 C)  Recent Labs  Lab 11/10/18 1953 11/11/18 0352  WBC 6.6  --   CREATININE 1.32* 1.32*  VANCORANDOM  --  21    Estimated Creatinine Clearance: 30.7 mL/min (A) (by C-G formula based on SCr of 1.32 mg/dL (H)).    Allergies  Allergen Reactions  . Amoxicillin     Has patient had a PCN reaction causing immediate rash, facial/tongue/throat swelling, SOB or lightheadedness with hypotension: Unknown Has patient had a PCN reaction causing severe rash involving mucus membranes or skin necrosis: Unknown Has patient had a PCN reaction that required hospitalization: Unknown Has patient had a PCN reaction occurring within the last 10 years: Unknown If all of the above answers are "NO", then may proceed with Cephalosporin use.   . Latex   . Lisinopril     Thank you for allowing pharmacy to be a part of this patient's care.  Lu Duffel, PharmD, BCPS Clinical Pharmacist 11/11/2018 10:17 AM

## 2018-11-11 NOTE — Consult Note (Addendum)
[facility] Nurse wound consult note Reason for Consult: Consult requested for bilat arms and legs.  Reviewed photos and progress notes in the EMR.  Pt appears to have very thin fragile skin with multiple bruises and full thickness abrasions to bilat arms and legs. Wound beds are red and moist with small amt bloody drainage.  Skin with generalized erythremia and edema. Dressing procedure/placement/frequency: Topical treatment orders provided for bedside nurses to perform daily: Apply xeroform gauze to bilat arm and leg abrasions Q day, then cover with ABD pads and kerlex to promote moist healing and minimize discomfort and adherence to the wound bed with dressing changes.  Please re-consult if further assistance is needed.  Thank-you,  Julien Girt MSN, [place], [place], [place], [place]

## 2018-11-11 NOTE — Evaluation (Signed)
Physical Therapy Evaluation Patient Details Name: Megan Donaldson MRN: [id number] DOB: [birth year] Today's Date: 11/11/2018   History of Present Illness  Pt is an 85 y/o F admitted for cellulitis in bilateral LE's. Pt has non-surgical fracture of L hand 2/2 fall at home. Ortho cleared to work with PT; use of platform walker for weight bearing restrictions. PMH includes HTN, HLD, CHF, dementia, seizures, GERD, GI bleed, DVT, R THA.   Clinical Impression  Pt is a pleasant 85 year old F who was admitted for cellulitis of bilateral LE's. Upon entry, pt is having bandages changed. She is not oriented to her date of birth and cannot immediately recall how she fractured her hand. Pt performs bed mobility and transfers with mod/max assist. She requires the use of UE and feet on floor to hold upright balance. Pt was able to stand using platform walker, but froze when attempt to amb.  Able to tolerate therex in supine, but requires min assist for SLR. Pt will benefit from PT to address deficits with strength, mobility, and activity tolerance. Current recommendation for follow-up care is SNF due to mobility status.     Follow Up Recommendations SNF    Equipment Recommendations  Rolling walker with 5" wheels    Recommendations for Other Services       Precautions / Restrictions Precautions Precautions: Fall Restrictions Weight Bearing Restrictions: Yes LUE Weight Bearing: (weight bear through forearm only)      Mobility  Bed Mobility Overal bed mobility: Needs Assistance Bed Mobility: Supine to Sit;Sit to Supine     Supine to sit: Max assist Sit to supine: Mod assist   General bed mobility comments: Pt able to reach edge of bed with mod assist from PT behind shoulders and holding PT hand. To return to bed, pt required assistance to bring bilateral LE back to bed and assist trunk to proper lying position.   Transfers Overall transfer level: Needs assistance Equipment used: Left platform  walker Transfers: Sit to/from Stand Sit to Stand: Max assist;+2 physical assistance         General transfer comment: Pt able to reach stand, but freezes when upright and states that she doesn't think she can walk today. Pt appeared nervous regarding the use of the platform walker.   Ambulation/Gait             General Gait Details: Unable  Stairs            Wheelchair Mobility    Modified Rankin (Stroke Patients Only)       Balance Overall balance assessment: Needs assistance Sitting-balance support: Feet unsupported;Single extremity supported Sitting balance-Leahy Scale: Fair Sitting balance - Comments: Needed assistance from at least one UE to hold balance in sitting to remain stable   Standing balance support: Bilateral upper extremity supported Standing balance-Leahy Scale: Poor Standing balance comment: +2 assist required to stand patient                             Pertinent Vitals/Pain Pain Assessment: Faces Faces Pain Scale: Hurts a little bit Pain Location: bilateral LE Pain Descriptors / Indicators: Discomfort Pain Intervention(s): Limited activity within patient's tolerance    Home Living Family/patient expects to be discharged to:: Assisted living               Home Equipment: Walker - 4 wheels Additional Comments: amb small distances in facility using walker    Prior Function Level of  Independence: Needs assistance         Comments: pt had some assistance with bathing and dressing, management of home     Hand Dominance   Dominant Hand: Right    Extremity/Trunk Assessment   Upper Extremity Assessment Upper Extremity Assessment: Generalized weakness    Lower Extremity Assessment Lower Extremity Assessment: Generalized weakness    Cervical / Trunk Assessment Cervical / Trunk Assessment: Kyphotic  Communication   Communication: HOH  Cognition Arousal/Alertness: Awake/alert Behavior During Therapy: WFL for  tasks assessed/performed Overall Cognitive Status: History of cognitive impairments - at baseline                                 General Comments: Pt had difficulty recalling what happened to her arm. Able to state month and day of birth but unable to recall year. Knows current month and year.       General Comments General comments (skin integrity, edema, etc.): Pt has several skin tears on bilateral LE's    Exercises Other Exercises Other Exercises: Supine, 10 reps: AP, SLR, SQ, GS; supervision from PT, min assist required for SLR   Assessment/Plan    PT Assessment Patient needs continued PT services  PT Problem List Decreased strength;Decreased activity tolerance;Decreased balance;Decreased mobility;Pain       PT Treatment Interventions Gait training;Therapeutic activities;Therapeutic exercise;Balance training    PT Goals (Current goals can be found in the Care Plan section)  Acute Rehab PT Goals Patient Stated Goal: to return home PT Goal Formulation: With patient Time For Goal Achievement: 11/25/18 Potential to Achieve Goals: Fair    Frequency Min 2X/week   Barriers to discharge        Co-evaluation               AM-PAC PT "6 Clicks" Mobility  Outcome Measure Help needed turning from your back to your side while in a flat bed without using bedrails?: A Little Help needed moving from lying on your back to sitting on the side of a flat bed without using bedrails?: A Lot Help needed moving to and from a bed to a chair (including a wheelchair)?: A Lot Help needed standing up from a chair using your arms (e.g., wheelchair or bedside chair)?: A Lot Help needed to walk in hospital room?: A Lot Help needed climbing 3-5 steps with a railing? : Total 6 Click Score: 12    End of Session Equipment Utilized During Treatment: Gait belt Activity Tolerance: Patient limited by fatigue;Patient limited by lethargy Patient left: in bed;with call bell/phone  within reach;with bed alarm set Nurse Communication: Mobility status(bleeding IV) PT Visit Diagnosis: Unsteadiness on feet (R26.81);Muscle weakness (generalized) (M62.81);History of falling (Z91.81);Pain Pain - Right/Left: Right(both) Pain - part of body: Leg    Time: 1610-9604 PT Time Calculation (min) (ACUTE ONLY): 41 min   Charges:   PT Evaluation $PT Eval Low Complexity: 1 Low PT Treatments $Therapeutic Exercise: 23-37 mins       Attie Nawabi, SPT   Bobbe Quilter 11/11/2018, 5:56 PM

## 2018-11-11 NOTE — Progress Notes (Signed)
Pharmacy Antibiotic Note  Megan Donaldson is a 85 y.o. female admitted on 11/10/2018 with multiple skin tears on LUE and LLE on shins.  Pharmacy has been consulted for vancomcyin dosing.  Plan: 09/25 @ 0400 VR 21 mcg/mL. Level appropriate will administer vanc 1g IV x 1 and check random level w/ BMP w/ am labs, renal function appears to be elevated but stable, will continue to monitor.  Height: 5\' 3"  (160 cm) Weight: 197 lb (89.4 kg) IBW/kg (Calculated) : 52.4  Temp (24hrs), Avg:94.7 F (34.8 C), Min:93.2 F (34 C), Max:97.6 F (36.4 C)  Recent Labs  Lab 11/10/18 1953 11/11/18 0352  WBC 6.6  --   CREATININE 1.32* 1.32*  VANCORANDOM  --  21    Estimated Creatinine Clearance: 30.7 mL/min (A) (by C-G formula based on SCr of 1.32 mg/dL (H)).    Allergies  Allergen Reactions  . Amoxicillin     Has patient had a PCN reaction causing immediate rash, facial/tongue/throat swelling, SOB or lightheadedness with hypotension: Unknown Has patient had a PCN reaction causing severe rash involving mucus membranes or skin necrosis: Unknown Has patient had a PCN reaction that required hospitalization: Unknown Has patient had a PCN reaction occurring within the last 10 years: Unknown If all of the above answers are "NO", then may proceed with Cephalosporin use.   . Latex   . Lisinopril     Thank you for allowing pharmacy to be a part of this patient's care.  Tobie Lords, PharmD, BCPS Clinical Pharmacist 11/11/2018 6:51 AM

## 2018-11-11 NOTE — Consult Note (Signed)
ORTHOPAEDIC CONSULTATION  REQUESTING PHYSICIAN: Auburn Bilberry, MD  Chief Complaint:   Left hand pain and swelling.  History of Present Illness: Megan Donaldson is a 85 y.o. female with a history of hypertension, hyperlipidemia, congestive heart failure, gastroesophageal reflux disease, osteoporosis, and dementia who lives in an assisted living facility.  The patient apparently falls frequently.  She fell 3 days ago, injuring her left hand.  X-rays were taken at that time at the assisted living facility which demonstrated a mildly displaced fracture of the fifth metacarpal.  The patient apparently was brought to the emergency room yesterday afternoon and subsequently was admitted for further evaluation and treatment.  The patient denies any significant pain in the hand, and denies any numbness or paresthesias to her fingers.  Past Medical History:  Diagnosis Date  . CHF (congestive heart failure) (HCC)   . Dementia (HCC)   . GERD (gastroesophageal reflux disease)   . Hyperlipidemia   . Hypertension   . Osteoporosis   . Personal history of other venous thrombosis and embolism   . Personal history of PE (pulmonary embolism)    Past Surgical History:  Procedure Laterality Date  . ESOPHAGOGASTRODUODENOSCOPY N/A 02/02/2018   Procedure: ESOPHAGOGASTRODUODENOSCOPY (EGD);  Surgeon: Toledo, Boykin Nearing, MD;  Location: [facility];  Service: Gastroenterology;  Laterality: N/A;  . HIP CLOSED REDUCTION Right 12/21/2014   Procedure: CLOSED REDUCTION HIP;  Surgeon: Deeann Saint, MD;  Location: [facility];  Service: Orthopedics;  Laterality: Right;  . HIP CLOSED REDUCTION Right 09/01/2018   Procedure: CLOSED REDUCTION HIP;  Surgeon: Donato Heinz, MD;  Location: [facility] OR[facility];  Service: Orthopedics;  Laterality: Right;  . TOTAL HIP ARTHROPLASTY Right    x3   Social History   Socioeconomic History  . Marital status: Widowed    Spouse name:  Not on file  . Number of children: Not on file  . Years of education: Not on file  . Highest education level: Not on file  Occupational History  . Occupation: retired  Engineer, production  . Financial resource strain: Not on file  . Food insecurity    Worry: Not on file    Inability: Not on file  . Transportation needs    Medical: Not on file    Non-medical: Not on file  Tobacco Use  . Smoking status: Never Smoker  . Smokeless tobacco: Never Used  Substance and Sexual Activity  . Alcohol use: No  . Drug use: No  . Sexual activity: Not Currently  Lifestyle  . Physical activity    Days per week: Not on file    Minutes per session: Not on file  . Stress: Not on file  Relationships  . Social Musician on phone: Not on file    Gets together: Not on file    Attends religious service: Not on file    Active member of club or organization: Not on file    Attends meetings of clubs or organizations: Not on file    Relationship status: Not on file  Other Topics Concern  . Not on file  Social History Narrative  . Not on file   Family History  Problem Relation Age of Onset  . CAD Father    Allergies  Allergen Reactions  . Amoxicillin     Has patient had a PCN reaction causing immediate rash, facial/tongue/throat swelling, SOB or lightheadedness with hypotension: Unknown Has patient had a PCN reaction causing severe rash involving mucus membranes or skin necrosis:  Unknown Has patient had a PCN reaction that required hospitalization: Unknown Has patient had a PCN reaction occurring within the last 10 years: Unknown If all of the above answers are "NO", then may proceed with Cephalosporin use.   . Latex   . Lisinopril    Prior to Admission medications   Medication Sig Start Date End Date Taking? Authorizing Provider  acetaminophen (TYLENOL) 500 MG tablet Take 1,000 mg by mouth 2 (two) times daily.    Yes [provider]  aspirin EC 81 MG tablet Take 81 mg by  mouth daily.   Yes [provider]  Calcium Carbonate-Vitamin D3 (CALCIUM 600-D) 600-400 MG-UNIT TABS Take 1 tablet by mouth 2 (two) times daily.   Yes [provider]  escitalopram (LEXAPRO) 10 MG tablet Take 10 mg by mouth daily. 07/21/17  Yes [provider]  Ferrous Sulfate 142 (45 Fe) MG TBCR Take 45 mg by mouth daily.   Yes [provider]  furosemide (LASIX) 20 MG tablet Take 20 mg by mouth every other day.   Yes [provider]  lamoTRIgine (LAMICTAL) 25 MG tablet Take 37.5 mg by mouth daily.  01/17/18 01/17/19 Yes [provider]  lamoTRIgine (LAMICTAL) 25 MG tablet Take 50 mg by mouth at bedtime.   Yes [provider]  levothyroxine (SYNTHROID, LEVOTHROID) 75 MCG tablet Take 75 mcg by mouth daily.   Yes [provider]  metoprolol succinate (TOPROL-XL) 25 MG 24 hr tablet Take 1 tablet (25 mg total) by mouth daily. 09/20/17  Yes Alford Highland, MD  neomycin-bacitracin-polymyxin (NEOSPORIN) OINT Apply 1 application topically daily. (apply to left leg)   Yes [provider]  pantoprazole (PROTONIX) 40 MG tablet Take 40 mg by mouth 2 (two) times daily.   Yes [provider]  Polyethylene Glycol 3350 (PEG 3350) 17 g PACK Take 17 g by mouth daily as needed (moderate constipation).    Yes [provider]  senna-docusate (SENOKOT-S) 8.6-50 MG tablet Take 2 tablets by mouth at bedtime.   Yes [provider]  simvastatin (ZOCOR) 40 MG tablet Take 40 mg by mouth at bedtime. 05/04/17  Yes [provider]  traZODone (DESYREL) 100 MG tablet Take 100 mg by mouth at bedtime.   Yes [provider]  white petrolatum (VASELINE) GEL Apply 1 application topically 3 (three) times daily. Apply to lips   Yes [provider]   Dg Forearm Left  Result Date: 11/10/2018 CLINICAL DATA:  Larey Seat a few days ago. Bruising to the hands and forearms. EXAM: LEFT FOREARM - 2 VIEW COMPARISON:  None.  FINDINGS: There is deformity at the wrist with dorsal angulation of the distal radial articular surface. The ulna projects more volar than expected for normal anatomic alignment. These findings are likely chronic due to old fractures. No convincing acute fracture of the forearm. Elbow joint is normally aligned. There is dorsal subcutaneous soft tissue edema. IMPRESSION: No acute fracture or dislocation. Electronically Signed   By: Amie Portland M.D.   On: 11/10/2018 20:23   Dg Forearm Right  Result Date: 11/10/2018 CLINICAL DATA:  85 year old female with a history of fall EXAM: RIGHT FOREARM - 2 VIEW COMPARISON:  None. FINDINGS: Osteopenia. No acute displaced fracture of the radius or ulna. Degenerative changes at the wrist. No radiopaque foreign body. IMPRESSION: Negative for acute bony abnormality. Osteopenia Electronically Signed   By: Gilmer Mor D.O.   On: 11/10/2018 20:16   Dg Hand Complete Left  Result Date: 11/10/2018  CLINICAL DATA:  Larey SeatFell a few days ago. Bruising to the hands and forearms. EXAM: LEFT HAND - COMPLETE 3+ VIEW COMPARISON:  None. FINDINGS: Oblique fracture of the fifth metacarpal extending from the volar midshaft to the dorsal ulnar aspect of the base. No fracture comminution. Approximately 3 mm of ulna displacement of the distal fracture component. Small corner fracture from the volar radial aspect of the base of the proximal phalanx of the middle finger, which could be acute or chronic. No other fractures.  No bone lesions. Skeletal structures diffusely demineralized. There are arthropathic changes with joint space narrowing, involving the interphalangeal joints and the metacarpal phalangeal joints as well as the scaphoid, trapezium and trapezium first metacarpal articulations. There is volar subluxation of the proximal phalanges of the index and middle fingers in relation to the metacarpal heads. Findings may reflect combination of osteoarthritis as well as an inflammatory arthritis  such as rheumatoid arthritis. IMPRESSION: 1. Acute minimally displaced fracture of the fifth metacarpal. 2. Small corner fracture from the radial volar base of the proximal phalanx of the left middle finger which may be acute or chronic. 3. No other fractures or acute findings. Electronically Signed   By: Amie Portlandavid  Ormond M.D.   On: 11/10/2018 20:20   Dg Hand Complete Right  Result Date: 11/10/2018 CLINICAL DATA:  85 year old female with fall and right hand pain. EXAM: RIGHT HAND - COMPLETE 3+ VIEW COMPARISON:  None. FINDINGS: There is no acute fracture or dislocation. There is severe osteopenia. Degenerative changes of the base of the thumb and interphalangeal joints. There are arthritic changes of the second and third MCP joint with mild chronic volar subluxation. The soft tissues are grossly unremarkable. IMPRESSION: 1. No acute fracture or dislocation. 2. Osteopenia with degenerative changes. 3. Erosive changes of the second and third MCP joints. Electronically Signed   By: Elgie CollardArash  Radparvar M.D.   On: 11/10/2018 20:18    Positive ROS: All other systems have been reviewed and were otherwise negative with the exception of those mentioned in the HPI and as above.  Physical Exam: General:  Alert, no acute distress Psychiatric:  Patient is not clearly competent for consent, but exhibits normal mood and affect   Cardiovascular:  No pedal edema Respiratory:  No wheezing, non-labored breathing GI:  Abdomen is soft and non-tender Skin:  No lesions in the area of chief complaint Neurologic:  Sensation intact distally Lymphatic:  No axillary or cervical lymphadenopathy  Orthopedic Exam:  Orthopedic examination is limited to the left hand.  The patient is in a volar splint which extends to the distal palmar flexion crease.  Moderate swelling and ecchymosis is noted involving all digits, but there does not appear to be any lacerations, abrasions, or other skin abnormalities identified.  All digits appear to  be well aligned clinically.  She is able to actively flex and extend all digits without obvious pain, although motion is limited due to being in the splint.  She is neurovascularly intact to all digits.  X-rays:  X-rays of the left hand are available for review and have been reviewed by myself.  These films demonstrate an oblique fracture through the proximal portion of the fifth metacarpal shaft with minimal displacement and overall excellent alignment.  There also appears to be an essentially nondisplaced avulsion fracture off the volar base of the proximal phalanx of the long finger.  Moderate degenerative changes of multiple joints in the hand are noted, especially the thumb CMC joint, which demonstrate severe degenerative changes.  Assessment: Minimally displaced fractures of the fifth metacarpal shaft and base of the long proximal phalanx, left hand.  Plan: The treatment options have been discussed with the patient.  Given the patient's age and level of activity, I feel that this injury can be managed nonsurgically.  At right now, her splint appears to be in excellent condition and her fingers are well aligned clinically.  Therefore, she is to remain in the splint.  She may weight-bear on the forearm, but is not to weight-bear through the hand.  Thank you for asking me to participate in the care of this most pleasant and unfortunate woman.  We will be happy to follow her with you.  If she is discharged over the weekend, please make arrangements for her to follow-up in our office in 7-10 days.   Pascal Lux, MD  Beeper #:  [phone]  11/11/2018 7:51 AM

## 2018-11-12 ENCOUNTER — Inpatient Hospital Stay: Payer: Medicare Other

## 2018-11-12 LAB — CBC
HCT: 27.4 % — ABNORMAL LOW (ref 36.0–46.0)
Hemoglobin: 8.6 g/dL — ABNORMAL LOW (ref 12.0–15.0)
MCH: 27 pg (ref 26.0–34.0)
MCHC: 31.4 g/dL (ref 30.0–36.0)
MCV: 85.9 fL (ref 80.0–100.0)
Platelets: 229 10*3/uL (ref 150–400)
RBC: 3.19 MIL/uL — ABNORMAL LOW (ref 3.87–5.11)
RDW: 23.9 % — ABNORMAL HIGH (ref 11.5–15.5)
WBC: 5.9 10*3/uL (ref 4.0–10.5)
nRBC: 0 % (ref 0.0–0.2)

## 2018-11-12 LAB — BASIC METABOLIC PANEL
Anion gap: 10 (ref 5–15)
BUN: 24 mg/dL — ABNORMAL HIGH (ref 8–23)
CO2: 26 mmol/L (ref 22–32)
Calcium: 9.5 mg/dL (ref 8.9–10.3)
Chloride: 104 mmol/L (ref 98–111)
Creatinine, Ser: 1.44 mg/dL — ABNORMAL HIGH (ref 0.44–1.00)
GFR calc Af Amer: 37 mL/min — ABNORMAL LOW (ref >60)
GFR calc non Af Amer: 32 mL/min — ABNORMAL LOW (ref >60)
Glucose, Bld: 84 mg/dL (ref 70–99)
Potassium: 3.9 mmol/L (ref 3.5–5.1)
Sodium: 140 mmol/L (ref 135–145)

## 2018-11-12 MED ORDER — ENOXAPARIN SODIUM 30 MG/0.3ML ~~LOC~~ SOLN
30.0000 mg | SUBCUTANEOUS | Status: DC
  Administered 2018-11-12: 30 mg via SUBCUTANEOUS
  Filled 2018-11-12: qty 0.3

## 2018-11-12 MED ORDER — SODIUM CHLORIDE 0.9 % IV SOLN
3.0000 g | Freq: Two times a day (BID) | INTRAVENOUS | Status: DC
  Administered 2018-11-12 (×2): 3 g via INTRAVENOUS
  Filled 2018-11-12: qty 3
  Filled 2018-11-12: qty 8
  Filled 2018-11-12: qty 3

## 2018-11-12 MED ORDER — SODIUM CHLORIDE 0.9 % IV SOLN
INTRAVENOUS | Status: AC
  Administered 2018-11-12: 13:00:00 via INTRAVENOUS

## 2018-11-12 NOTE — Consult Note (Signed)
Pharmacy Antibiotic Note  Megan Donaldson is a 85 y.o. female admitted on 11/10/2018 with bilateral lower extremity cellulitis.  Pharmacy has been consulted for Unasyn dosing. She was originally started on vancomycin. Her renal function is slightly lower than what appears to be her baseline. This is day # 2 of antibiotic therapy, no leukocytosis or recent fevers. She shows an allergic reaction to amoxicillin but the reaction is documented as vomiting.  Plan:  Start Unasyn 3 grams IV every 12 hours  Height: 5\' 3"  (160 cm) Weight: 197 lb (89.4 kg) IBW/kg (Calculated) : 52.4  Temp (24hrs), Avg:96.9 F (36.1 C), Min:94.6 F (34.8 C), Max:98.9 F (37.2 C)  Recent Labs  Lab 11/10/18 1953 11/11/18 0352 11/12/18 0448  WBC 6.6  --  5.9  CREATININE 1.32* 1.32* 1.44*  VANCORANDOM  --  21  --     Estimated Creatinine Clearance: 28.1 mL/min (A) (by C-G formula based on SCr of 1.44 mg/dL (H)).    Allergies  Allergen Reactions  . Amoxicillin     Has patient had a PCN reaction causing immediate rash, facial/tongue/throat swelling, SOB or lightheadedness with hypotension: Unknown Has patient had a PCN reaction causing severe rash involving mucus membranes or skin necrosis: Unknown Has patient had a PCN reaction that required hospitalization: Unknown Has patient had a PCN reaction occurring within the last 10 years: Unknown If all of the above answers are "NO", then may proceed with Cephalosporin use.   . Latex   . Lisinopril     Antimicrobials this admission: vancomycin 9/25 >> 9/26 Unasyn 9/26 >>   Microbiology results: 9/24 BCx: NGTD 9/24 SARS CoV-2: negative   Thank you for allowing pharmacy to be a part of this patient's care.  Dallie Piles, PharmD 11/12/2018 7:36 AM

## 2018-11-12 NOTE — Progress Notes (Signed)
[facility]                                                                                                                                                                                  Patient Demographics   Megan Donaldson, is a 85 y.o. female, DOB - 05-Jul-1927, ZOX:[id number]  Admit date - 11/10/2018   Admitting Physician Jimmye Norman, NP  Outpatient Primary MD for the patient is [facility]   LOS - 2  Subjective: Patient states she is feeling fine today.  She has no concerns this morning.  She states her left hand is hurting, but that the pain medicines are helping.  Review of Systems:   CONSTITUTIONAL: No documented fever. No fatigue, weakness. No weight gain, no weight loss.  EYES: No blurry or double vision.  ENT: No tinnitus. No postnasal drip. No redness of the oropharynx.  RESPIRATORY: No cough, no wheeze, no hemoptysis. No dyspnea.  CARDIOVASCULAR: No chest pain. No orthopnea. No palpitations. No syncope.  GASTROINTESTINAL: No nausea, no vomiting or diarrhea. No abdominal pain. No melena or hematochezia.  GENITOURINARY: No dysuria or hematuria.  ENDOCRINE: No polyuria or nocturia. No heat or cold intolerance.  HEMATOLOGY: No anemia.  Positive bruising. No bleeding.  INTEGUMENTARY: Bilateral lower extremity redness and erythema MUSCULOSKELETAL: No arthritis. No swelling. No gout. +left hand pain NEUROLOGIC: No numbness, tingling, or ataxia. No seizure-type activity.  PSYCHIATRIC: No anxiety. No insomnia. No ADD.   Vitals:   Vitals:   11/11/18 1344 11/11/18 1913 11/12/18 0504 11/12/18 1018  BP: (!) 110/57 (!) 112/57 (!) 114/48 119/63  Pulse: 70 69 69 70  Resp: Temp: 97.8 F (36.6 C) (!) 97.4 F (36.3 C) 98.9 F (37.2 C)   TempSrc: Oral Oral Oral   SpO2: 93% 93% 93%   Weight:      Height:        Wt Readings from Last 3 Encounters:  11/10/18 89.4 kg  09/01/18 89 kg  03/16/18 86.2 kg      Intake/Output Summary (Last 24 hours) at 11/12/2018 1153 Last data filed at 11/12/2018 1031 Gross per 24 hour  Intake 481.24 ml  Output 1350 ml  Net -868.76 ml    Physical Exam:   GENERAL: Pleasant-appearing in no apparent distress.  HEENT: Atraumatic, normocephalic. Extraocular muscles are intact. Pupils equal and reactive to light. Sclerae anicteric. No conjunctival injection. No oropharyngeal erythema.  NECK: Supple. There is no jugular venous distention. No bruits, no lymphadenopathy, no thyromegaly.  HEART: Regular rate and rhythm,. No murmurs, no rubs, no clicks.  LUNGS: Clear to auscultation bilaterally. No rales or rhonchi.  No wheezes.  ABDOMEN: Soft, flat, nontender, nondistended. Has good bowel sounds. No hepatosplenomegaly appreciated.  EXTREMITIES: No evidence of any cyanosis, clubbing, or peripheral edema.  +2 pedal and radial pulses bilaterally. Left hand with splint and bandage in place. NEUROLOGIC: The patient is alert, awake, and oriented x3 with no focal motor or sensory deficits appreciated bilaterally.  SKIN: Bilateral lower extremity swelling and redness present skin abrasions present as well Psych: Not anxious, depressed    Antibiotics   Anti-infectives (From admission, onward)   Start     Dose/Rate Route Frequency Ordered Stop   11/13/18 1000  vancomycin (VANCOCIN) IVPB 1000 mg/200 mL premix  Status:  Discontinued     1,000 mg 200 mL/hr over 60 Minutes Intravenous Every 48 hours 11/11/18 1019 11/11/18 1302   11/13/18 1000  vancomycin (VANCOCIN) IVPB 1000 mg/200 mL premix  Status:  Discontinued     1,000 mg 200 mL/hr over 60 Minutes Intravenous Every 48 hours 11/11/18 1333 11/12/18 0729   11/12/18 1000  Ampicillin-Sulbactam (UNASYN) 3 g in sodium chloride 0.9 % 100 mL IVPB     3 g 200 mL/hr over 30 Minutes Intravenous Every 12 hours 11/12/18 0747     11/11/18 0700  vancomycin (VANCOCIN) IVPB 1000 mg/200 mL premix     1,000 mg 200 mL/hr over 60 Minutes  Intravenous  Once 11/11/18 0651 11/11/18 1100   11/11/18 0323  vancomycin variable dose per unstable renal function (pharmacist dosing)  Status:  Discontinued      Does not apply See admin instructions 11/11/18 0323 11/11/18 1019   11/10/18 2315  vancomycin (VANCOCIN) IVPB 1000 mg/200 mL premix  Status:  Discontinued     1,000 mg 200 mL/hr over 60 Minutes Intravenous  Once 11/10/18 2311 11/11/18 0012   11/10/18 2100  vancomycin (VANCOCIN) 1,500 mg in sodium chloride 0.9 % 500 mL IVPB     1,500 mg 250 mL/hr over 120 Minutes Intravenous  Once 11/10/18 2007 11/11/18 0010   11/10/18 2000  vancomycin (VANCOCIN) IVPB 1000 mg/200 mL premix  Status:  Discontinued     1,000 mg 200 mL/hr over 60 Minutes Intravenous  Once 11/10/18 1953 11/10/18 2007   11/10/18 2000  levofloxacin (LEVAQUIN) IVPB 750 mg     750 mg 100 mL/hr over 90 Minutes Intravenous  Once 11/10/18 1953 11/10/18 2206      Medications   Scheduled Meds: . acetaminophen  1,000 mg Oral BID  . aspirin EC  81 mg Oral Daily  . calcium-vitamin D  1 tablet Oral BID  . enoxaparin (LOVENOX) injection  30 mg Subcutaneous Q24H  . escitalopram  10 mg Oral Daily  . ferrous sulfate  325 mg Oral Q breakfast  . lamoTRIgine  37.5 mg Oral Daily  . lamoTRIgine  50 mg Oral QHS  . levothyroxine  75 mcg Oral Daily  . metoprolol succinate  25 mg Oral Daily  . neomycin-bacitracin-polymyxin  1 application Topical Daily  . pantoprazole  40 mg Oral BID  . senna-docusate  2 tablet Oral QHS  . simvastatin  40 mg Oral QHS  . traZODone  100 mg Oral QHS  . white petrolatum  1 application Topical TID   Continuous Infusions: . ampicillin-sulbactam (UNASYN) IV 200 mL/hr at 11/12/18 1031   PRN Meds:.polyethylene glycol   Data Review:   Micro Results Recent Results (from the past 240 hour(s))  SARS Coronavirus 2 [facility] order, Performed in [facility] hospital lab) Nasopharyngeal Nasopharyngeal Swab     Status: None  Collection Time: 11/10/18  7:55  PM   Specimen: Nasopharyngeal Swab  Result Value Ref Range Status   SARS Coronavirus 2 NEGATIVE NEGATIVE Final    Comment: (NOTE) If result is NEGATIVE SARS-CoV-2 target nucleic acids are NOT DETECTED. The SARS-CoV-2 RNA is generally detectable in upper and lower  respiratory specimens during the acute phase of infection. The lowest  concentration of SARS-CoV-2 viral copies this assay can detect is 250  copies / mL. A negative result does not preclude SARS-CoV-2 infection  and should not be used as the sole basis for treatment or other  patient management decisions.  A negative result may occur with  improper specimen collection / handling, submission of specimen other  than nasopharyngeal swab, presence of viral mutation(s) within the  areas targeted by this assay, and inadequate number of viral copies  (<250 copies / mL). A negative result must be combined with clinical  observations, patient history, and epidemiological information. If result is POSITIVE SARS-CoV-2 target nucleic acids are DETECTED. The SARS-CoV-2 RNA is generally detectable in upper and lower  respiratory specimens dur ing the acute phase of infection.  Positive  results are indicative of active infection with SARS-CoV-2.  Clinical  correlation with patient history and other diagnostic information is  necessary to determine patient infection status.  Positive results do  not rule out bacterial infection or co-infection with other viruses. If result is PRESUMPTIVE POSTIVE SARS-CoV-2 nucleic acids MAY BE PRESENT.   A presumptive positive result was obtained on the submitted specimen  and confirmed on repeat testing.  While 2019 novel coronavirus  (SARS-CoV-2) nucleic acids may be present in the submitted sample  additional confirmatory testing may be necessary for epidemiological  and / or clinical management purposes  to differentiate between  SARS-CoV-2 and other Sarbecovirus currently known to infect humans.   If clinically indicated additional testing with an alternate test  methodology [phone]) is advised. The SARS-CoV-2 RNA is generally  detectable in upper and lower respiratory sp ecimens during the acute  phase of infection. The expected result is Negative. Fact Sheet for Patients:  BoilerBrush.com.cy Fact Sheet for Healthcare Providers: https://pope.com/ This test is not yet approved or cleared by the [place] FDA and has been authorized for detection and/or diagnosis of SARS-CoV-2 by FDA under an Emergency Use Authorization (EUA).  This EUA will remain in effect (meaning this test can be used) for the duration of the COVID-19 declaration under Section 564(b)(1) of the Act, 21 U.S.C. section 360bbb-3(b)(1), unless the authorization is terminated or revoked sooner. Performed at [facility], [address] [place], [place] [address]   Blood Culture (routine x 2)     Status: None (Preliminary result)   Collection Time: 11/10/18  8:23 PM   Specimen: BLOOD  Result Value Ref Range Status   Specimen Description BLOOD BLOOD RIGHT WRIST  Final   Special Requests   Final    BOTTLES DRAWN AEROBIC AND ANAEROBIC Blood Culture adequate volume   Culture   Final    NO GROWTH 2 DAYS Performed at [facility], [address], [place], [place] [address]    Report Status PENDING  Incomplete  Blood Culture (routine x 2)     Status: None (Preliminary result)   Collection Time: 11/10/18  8:23 PM   Specimen: BLOOD  Result Value Ref Range Status   Specimen Description BLOOD RIGHT ANTECUBITAL  Final   Special Requests   Final    BOTTLES DRAWN AEROBIC AND ANAEROBIC  Blood Culture adequate volume   Culture   Final    NO GROWTH 2 DAYS Performed at [facility], [address] [place], [place] [address]    Report Status PENDING  Incomplete    Radiology Reports Dg Forearm Left  Result Date: 11/10/2018 CLINICAL  DATA:  Larey Seat a few days ago. Bruising to the hands and forearms. EXAM: LEFT FOREARM - 2 VIEW COMPARISON:  None. FINDINGS: There is deformity at the wrist with dorsal angulation of the distal radial articular surface. The ulna projects more volar than expected for normal anatomic alignment. These findings are likely chronic due to old fractures. No convincing acute fracture of the forearm. Elbow joint is normally aligned. There is dorsal subcutaneous soft tissue edema. IMPRESSION: No acute fracture or dislocation. Electronically Signed   By: Amie Portland M.D.   On: 11/10/2018 20:23   Dg Forearm Right  Result Date: 11/10/2018 CLINICAL DATA:  85 year old female with a history of fall EXAM: RIGHT FOREARM - 2 VIEW COMPARISON:  None. FINDINGS: Osteopenia. No acute displaced fracture of the radius or ulna. Degenerative changes at the wrist. No radiopaque foreign body. IMPRESSION: Negative for acute bony abnormality. Osteopenia Electronically Signed   By: Gilmer Mor D.O.   On: 11/10/2018 20:16   US Renal  Result Date: 11/12/2018 CLINICAL DATA:  Acute kidney injury. EXAM: RENAL / URINARY TRACT ULTRASOUND COMPLETE COMPARISON:  None. FINDINGS: Right Kidney: Renal measurements: 9.2 x 4.0 x 3.8 cm = volume: 74 mL. Cortical thinning with increased echogenicity. No hydronephrosis. Left Kidney: Renal measurements: 7.9 x 4.6 x 5.5 cm = volume: 106 mL. Cortical thinning with increased echogenicity. 2.2 cm cystic lesion in the upper pole has increased only minimally compared to a CT scan of 04/23/2010 when it measured 1.6 cm. Bladder: Appears normal for degree of bladder distention. IMPRESSION: Cortical thinning with increased cortical echogenicity in the kidneys bilaterally, findings consistent with medical renal disease. No hydronephrosis. Electronically Signed   By: Kennith Center M.D.   On: 11/12/2018 11:29   US Venous Img Lower Bilateral  Result Date: 11/11/2018 CLINICAL DATA:  85 year old female with bilateral  lower extremity edema EXAM: BILATERAL LOWER EXTREMITY VENOUS DOPPLER ULTRASOUND TECHNIQUE: Gray-scale sonography with graded compression, as well as color Doppler and duplex ultrasound were performed to evaluate the lower extremity deep venous systems from the level of the common femoral vein and including the common femoral, femoral, profunda femoral, popliteal and calf veins including the posterior tibial, peroneal and gastrocnemius veins when visible. The superficial great saphenous vein was also interrogated. Spectral Doppler was utilized to evaluate flow at rest and with distal augmentation maneuvers in the common femoral, femoral and popliteal veins. COMPARISON:  None. FINDINGS: RIGHT LOWER EXTREMITY Common Femoral Vein: No evidence of thrombus. Normal compressibility, respiratory phasicity and response to augmentation. Saphenofemoral Junction: No evidence of thrombus. Normal compressibility and flow on color Doppler imaging. Profunda Femoral Vein: No evidence of thrombus. Normal compressibility and flow on color Doppler imaging. Femoral Vein: No evidence of thrombus. Normal compressibility, respiratory phasicity and response to augmentation. Popliteal Vein: No evidence of thrombus. Normal compressibility, respiratory phasicity and response to augmentation. Calf Veins: No evidence of thrombus. Normal compressibility and flow on color Doppler imaging. Superficial Great Saphenous Vein: No evidence of thrombus. Normal compressibility. Venous Reflux:  None. Other Findings: Small fluid collection in the popliteal fossa measures 1.9 x 1.0 x 2.6 cm. Imaging findings are most consistent with a Baker's cyst. LEFT LOWER EXTREMITY Common Femoral Vein: No evidence  of thrombus. Normal compressibility, respiratory phasicity and response to augmentation. Saphenofemoral Junction: No evidence of thrombus. Normal compressibility and flow on color Doppler imaging. Profunda Femoral Vein: No evidence of thrombus. Normal  compressibility and flow on color Doppler imaging. Femoral Vein: No evidence of thrombus. Normal compressibility, respiratory phasicity and response to augmentation. Popliteal Vein: No evidence of thrombus. Normal compressibility, respiratory phasicity and response to augmentation. Calf Veins: No evidence of thrombus. Normal compressibility and flow on color Doppler imaging. Superficial Great Saphenous Vein: No evidence of thrombus. Normal compressibility. Venous Reflux:  None. Other Findings:  None. IMPRESSION: No evidence of deep venous thrombosis in either lower extremity. Small right-sided Baker's cyst. Electronically Signed   By: Malachy MoanHeath  McCullough M.D.   On: 11/11/2018 11:13   Dg Hand Complete Left  Result Date: 11/10/2018 CLINICAL DATA:  Larey SeatFell a few days ago. Bruising to the hands and forearms. EXAM: LEFT HAND - COMPLETE 3+ VIEW COMPARISON:  None. FINDINGS: Oblique fracture of the fifth metacarpal extending from the volar midshaft to the dorsal ulnar aspect of the base. No fracture comminution. Approximately 3 mm of ulna displacement of the distal fracture component. Small corner fracture from the volar radial aspect of the base of the proximal phalanx of the middle finger, which could be acute or chronic. No other fractures.  No bone lesions. Skeletal structures diffusely demineralized. There are arthropathic changes with joint space narrowing, involving the interphalangeal joints and the metacarpal phalangeal joints as well as the scaphoid, trapezium and trapezium first metacarpal articulations. There is volar subluxation of the proximal phalanges of the index and middle fingers in relation to the metacarpal heads. Findings may reflect combination of osteoarthritis as well as an inflammatory arthritis such as rheumatoid arthritis. IMPRESSION: 1. Acute minimally displaced fracture of the fifth metacarpal. 2. Small corner fracture from the radial volar base of the proximal phalanx of the left middle finger  which may be acute or chronic. 3. No other fractures or acute findings. Electronically Signed   By: Amie Portlandavid  Ormond M.D.   On: 11/10/2018 20:20   Dg Hand Complete Right  Result Date: 11/10/2018 CLINICAL DATA:  85 year old female with fall and right hand pain. EXAM: RIGHT HAND - COMPLETE 3+ VIEW COMPARISON:  None. FINDINGS: There is no acute fracture or dislocation. There is severe osteopenia. Degenerative changes of the base of the thumb and interphalangeal joints. There are arthritic changes of the second and third MCP joint with mild chronic volar subluxation. The soft tissues are grossly unremarkable. IMPRESSION: 1. No acute fracture or dislocation. 2. Osteopenia with degenerative changes. 3. Erosive changes of the second and third MCP joints. Electronically Signed   By: Elgie CollardArash  Radparvar M.D.   On: 11/10/2018 20:18     CBC Recent Labs  Lab 11/10/18 1953 11/12/18 0448  WBC 6.6 5.9  HGB 9.8* 8.6*  HCT 31.5* 27.4*  PLT 246 229  MCV 86.8 85.9  MCH 27.0 27.0  MCHC 31.1 31.4  RDW 23.8* 23.9*  LYMPHSABS 1.7  --   MONOABS 0.7  --   EOSABS 0.1  --   BASOSABS 0.0  --     Chemistries  Recent Labs  Lab 11/10/18 1953 11/11/18 0352 11/12/18 0448  NA 140 142 140  K 4.2 3.7 3.9  CL 103 105 104  CO2 28 26 26   GLUCOSE 88 86 84  BUN 29* 27* 24*  CREATININE 1.32* 1.32* 1.44*  CALCIUM 10.0 9.4 9.5  AST 31  --   --   ALT  19  --   --   ALKPHOS 70  --   --   BILITOT 0.7  --   --    ------------------------------------------------------------------------------------------------------------------ estimated creatinine clearance is 28.1 mL/min (A) (by C-G formula based on SCr of 1.44 mg/dL (H)). ------------------------------------------------------------------------------------------------------------------ No results for input(s): HGBA1C in the last 72 hours. ------------------------------------------------------------------------------------------------------------------ No results for  input(s): CHOL, HDL, LDLCALC, TRIG, CHOLHDL, LDLDIRECT in the last 72 hours. ------------------------------------------------------------------------------------------------------------------ No results for input(s): TSH, T4TOTAL, T3FREE, THYROIDAB in the last 72 hours.  Invalid input(s): FREET3 ------------------------------------------------------------------------------------------------------------------ No results for input(s): VITAMINB12, FOLATE, FERRITIN, TIBC, IRON, RETICCTPCT in the last 72 hours.  Coagulation profile No results for input(s): INR, PROTIME in the last 168 hours.  No results for input(s): DDIMER in the last 72 hours.  Cardiac Enzymes No results for input(s): CKMB, TROPONINI, MYOGLOBIN in the last 168 hours.  Invalid input(s): CK ------------------------------------------------------------------------------------------------------------------ Invalid input(s): POCBNP    Assessment & Plan   Bilateral lower extremity cellulitis- improving.  She was hypothermic on admission, but not meeting sepsis criteria.  Hypothermia has resolved. -Stop vancomycin and transition to unasyn due to worsening renal function -Blood cultures with no growth -Local wound care  Displaced fracture left hand- secondary to mechanical fall -Splint in place -Seen by orthopedic surgery- plan for nonsurgical management.  Recommended splint and nonweightbearing through the hand. -Will need to follow-up with orthopedic surgery in 7-10 days -PT consult- recommending SNF  Acute on chronic CKD III- creatinine worsening, may be due to vancomycin -Will give some gentle IVFs until tomorrow morning -Avoid nephrotoxic agents -Check renal ultrasound -Stop vancomycin  Seizures- no seizure-like activity this admission -Continue Lamictal  Hypertension- BP well controlled -Continue metoprolol  Hyperlipidemia -Continue home simvastatin  Hypothyroidism  -Continue home  Synthroid  Chronic normocytic anemia- hemoglobin at baseline. No active bleeding. -Monitor  DVT prophylaxis-Lovenox  Daughter, Lesle Reek, updated on the plan.      Code Status Orders  (From admission, onward)         Start     Ordered   11/10/18 2310  Do not attempt resuscitation (DNR)  Continuous    Question Answer Comment  In the event of cardiac or respiratory ARREST Do not call a "code blue"   In the event of cardiac or respiratory ARREST Do not perform Intubation, CPR, defibrillation or ACLS   In the event of cardiac or respiratory ARREST Use medication by any route, position, wound care, and other measures to relive pain and suffering. May use oxygen, suction and manual treatment of airway obstruction as needed for comfort.      11/10/18 2311        Code Status History    Date Active Date Inactive Code Status Order ID Comments User Context   09/01/2018 0231 09/01/2018 2045 DNR [id number]  Donato Heinz, MD Inpatient   02/01/2018 2107 02/03/2018 2129 DNR [id number]  Ihor Austin, MD Inpatient   09/17/2017 2300 09/19/2017 1904 DNR [id number]  Cammy Copa, MD Inpatient   08/07/2017 1553 08/08/2017 1938 DNR [id number]  Milagros Loll, MD ED   08/07/2017 1528 08/07/2017 1553 DNR [id number]  Willy Eddy, MD ED   Advance Care Planning Activity    Advance Directive Documentation     Most Recent Value  Type of Advance Directive  Out of facility DNR (pink MOST or yellow form)  Pre-existing out of facility DNR order (yellow form or pink MOST form)  Pink MOST form placed in chart (order not valid for inpatient use)  "MOST" Form in  Place?  -      Consults orthopedic  DVT Prophylaxis  Lovenox Lab Results  Component Value Date   PLT 229 11/12/2018     Time Spent in minutes   40 min Greater than 50% of time spent in care coordination and counseling patient regarding the condition and plan of care.   Jinny Blossom Klaire Court M.D on 11/12/2018 at 11:53 AM  Between 7am to 6pm - Pager -  [phone]  After 6pm go to [email or web address] - Social research officer, government  [employer]   Office  [phone]

## 2018-11-13 LAB — GLUCOSE, CAPILLARY: Glucose-Capillary: 86 mg/dL (ref 70–99)

## 2018-11-13 LAB — CBC
HCT: 28.4 % — ABNORMAL LOW (ref 36.0–46.0)
Hemoglobin: 8.8 g/dL — ABNORMAL LOW (ref 12.0–15.0)
MCH: 26.7 pg (ref 26.0–34.0)
MCHC: 31 g/dL (ref 30.0–36.0)
MCV: 86.3 fL (ref 80.0–100.0)
Platelets: 229 10*3/uL (ref 150–400)
RBC: 3.29 MIL/uL — ABNORMAL LOW (ref 3.87–5.11)
RDW: 23.3 % — ABNORMAL HIGH (ref 11.5–15.5)
WBC: 5.8 10*3/uL (ref 4.0–10.5)
nRBC: 0 % (ref 0.0–0.2)

## 2018-11-13 LAB — BASIC METABOLIC PANEL
Anion gap: 8 (ref 5–15)
BUN: 19 mg/dL (ref 8–23)
CO2: 26 mmol/L (ref 22–32)
Calcium: 9.1 mg/dL (ref 8.9–10.3)
Chloride: 107 mmol/L (ref 98–111)
Creatinine, Ser: 1.24 mg/dL — ABNORMAL HIGH (ref 0.44–1.00)
GFR calc Af Amer: 45 mL/min — ABNORMAL LOW (ref >60)
GFR calc non Af Amer: 38 mL/min — ABNORMAL LOW (ref >60)
Glucose, Bld: 87 mg/dL (ref 70–99)
Potassium: 3.4 mmol/L — ABNORMAL LOW (ref 3.5–5.1)
Sodium: 141 mmol/L (ref 135–145)

## 2018-11-13 MED ORDER — ENOXAPARIN SODIUM 40 MG/0.4ML ~~LOC~~ SOLN
40.0000 mg | SUBCUTANEOUS | Status: DC
  Administered 2018-11-13: 21:00:00 40 mg via SUBCUTANEOUS
  Filled 2018-11-13: qty 0.4

## 2018-11-13 MED ORDER — SODIUM CHLORIDE 0.9 % IV SOLN
3.0000 g | Freq: Four times a day (QID) | INTRAVENOUS | Status: DC
  Administered 2018-11-13 – 2018-11-15 (×10): 3 g via INTRAVENOUS
  Filled 2018-11-13 (×3): qty 8
  Filled 2018-11-13 (×2): qty 3
  Filled 2018-11-13 (×2): qty 8
  Filled 2018-11-13 (×4): qty 3
  Filled 2018-11-13: qty 8
  Filled 2018-11-13: qty 3
  Filled 2018-11-13: qty 8
  Filled 2018-11-13: qty 3

## 2018-11-13 MED ORDER — POTASSIUM CHLORIDE CRYS ER 20 MEQ PO TBCR
40.0000 meq | EXTENDED_RELEASE_TABLET | Freq: Once | ORAL | Status: AC
  Administered 2018-11-13: 10:00:00 40 meq via ORAL
  Filled 2018-11-13: qty 2

## 2018-11-13 MED ORDER — SODIUM CHLORIDE 0.9 % IV SOLN
INTRAVENOUS | Status: DC | PRN
  Administered 2018-11-13: 10 mL via INTRAVENOUS
  Administered 2018-11-14: 1000 mL via INTRAVENOUS
  Administered 2018-11-14 (×2): 10 mL via INTRAVENOUS

## 2018-11-13 NOTE — [place] FL2 (Signed)
[place] MEDICAID FL2 LEVEL OF CARE SCREENING TOOL     IDENTIFICATION  Patient Name: Megan Donaldson Birthdate: 1927/10/19 Sex: female Admission Date (Current Location): 11/10/2018  [place] and [place] Number:  Chiropodist and Address:  [facility], [place], [address] [place], [place] [address]      Provider Number: [id number]  Attending Physician Name and Address:  Campbell Stall, MD  Relative Name and Phone Number:  Barton Fanny Daughter [phone]  [phone]    Current Level of Care: Hospital Recommended Level of Care: Skilled Nursing Facility Prior Approval Number:    Date Approved/Denied:   PASRR Number: [id number]  Discharge Plan: SNF    Current Diagnoses: Patient Active Problem List   Diagnosis Date Noted  . Bilateral lower leg cellulitis 11/10/2018  . Acute gastric ulcer with hemorrhage 03/10/2018  . GI bleed 02/01/2018  . Altered mental status 09/17/2017  . Seizure (HCC) 08/07/2017  . 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 Gastroesophageal reflux disease 10/22/2015  . Mild depression (HCC) 10/22/2015  . H/O: GI bleed 07/30/2015  . History of deep venous thrombosis 07/30/2015  . History of pulmonary embolism 07/30/2015  . 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 S/P closed reduction of dislocated total hip prosthesis 03/19/2015  . Essential hypertension 03/23/2014  . Hyperlipidemia 03/23/2014  . Hypothyroidism 03/23/2014    Orientation RESPIRATION BLADDER Height & Weight     Self, Time, Situation, Place  Normal Continent Weight: 89.4 kg Height:  5\' 3"  (160 cm)  BEHAVIORAL SYMPTOMS/MOOD NEUROLOGICAL BOWEL NUTRITION STATUS      Continent Diet  AMBULATORY STATUS COMMUNICATION OF NEEDS Skin   Extensive Assist Verbally Skin abrasions, Other (Comment)(bilateral cellulitis wounds to lower extremities)                       Personal Care Assistance Level of Assistance  Bathing, Feeding,  Dressing, Total care Bathing Assistance: Limited assistance Feeding assistance: Independent Dressing Assistance: Maximum assistance Total Care Assistance: Maximum assistance   Functional Limitations Info  Sight, Hearing, Speech Sight Info: Adequate Hearing Info: Adequate Speech Info: Adequate    SPECIAL CARE FACTORS FREQUENCY  PT (By licensed PT), OT (By licensed OT)     PT Frequency: 5x per week OT Frequency: 5x per week            Contractures Contractures Info: Not present    Additional Factors Info  Allergies, Code Status Code Status Info: DNR Allergies Info: amoxicillin, latex, lisinopril           Current Medications (11/13/2018):  This is the current hospital active medication list Current Facility-Administered Medications  Medication Dose Route Frequency Provider Last Rate Last Dose  . 0.9 %  sodium chloride infusion   Intravenous PRN 11/15/2018, MD 10 mL/hr at 11/13/18 0949 10 mL at 11/13/18 0949  . acetaminophen (TYLENOL) tablet 1,000 mg  1,000 mg Oral BID 11/15/18, NP   1,000 mg at 11/13/18 1019  . Ampicillin-Sulbactam (UNASYN) 3 g in sodium chloride 0.9 % 100 mL IVPB  3 g Intravenous Q6H Mayo, 11/15/18, MD 200 mL/hr at 11/13/18 1244 3 g at 11/13/18 1244  . aspirin EC tablet 81 mg  81 mg Oral Daily 11/15/18, NP   81 mg at 11/13/18 1011  . calcium-vitamin D (OSCAL WITH D) 500-200 MG-UNIT per tablet 1 tablet  1 tablet Oral BID 11/15/18, NP   1 tablet at 11/13/18 1015  .  enoxaparin (LOVENOX) injection 40 mg  40 mg Subcutaneous Q24H Dallie Piles, RPH      . escitalopram (LEXAPRO) tablet 10 mg  10 mg Oral Daily Lang Snow, NP   10 mg at 11/13/18 1016  . ferrous sulfate tablet 325 mg  325 mg Oral Q breakfast Lang Snow, NP   325 mg at 11/13/18 1016  . lamoTRIgine (LAMICTAL) tablet 37.5 mg  37.5 mg Oral Daily Lang Snow, NP   37.5 mg at 11/13/18 1014  . lamoTRIgine (LAMICTAL)  tablet 50 mg  50 mg Oral QHS Lang Snow, NP   50 mg at 11/12/18 2145  . levothyroxine (SYNTHROID) tablet 75 mcg  75 mcg Oral Daily Lang Snow, NP   75 mcg at 11/13/18 0646  . metoprolol succinate (TOPROL-XL) 24 hr tablet 25 mg  25 mg Oral Daily Lang Snow, NP   25 mg at 11/13/18 1015  . neomycin-bacitracin-polymyxin (NEOSPORIN) ointment 1 application  1 application Topical Daily Lang Snow, NP   1 application at 59/16/38 1015  . pantoprazole (PROTONIX) EC tablet 40 mg  40 mg Oral BID Lang Snow, NP   40 mg at 11/13/18 1015  . polyethylene glycol (MIRALAX / GLYCOLAX) packet 17 g  17 g Oral Daily PRN Lang Snow, NP      . senna-docusate (Senokot-S) tablet 2 tablet  2 tablet Oral QHS Lang Snow, NP   2 tablet at 11/12/18 2145  . simvastatin (ZOCOR) tablet 40 mg  40 mg Oral QHS Lang Snow, NP   40 mg at 11/12/18 2145  . traZODone (DESYREL) tablet 100 mg  100 mg Oral QHS Lang Snow, NP   100 mg at 11/12/18 2145  . white petrolatum (VASELINE) gel 1 application  1 application Topical TID Lang Snow, NP   1 application at 46/65/99 1250     Discharge Medications: Please see discharge summary for a list of discharge medications.  Relevant Imaging Results:  Relevant Lab Results:   Additional Information ss [id number]  Latanya Maudlin, RN

## 2018-11-13 NOTE — TOC Initial Note (Signed)
Transition of Care [facility]) - Initial/Assessment Note    Patient Details  Name: Megan Donaldson MRN: [id number] Date of Birth: 04/03/1927  [facility]) CM/SW Contact:    Latanya Maudlin, RN Phone Number: 11/13/2018, 12:40 PM  Clinical Narrative: [facility] team consulted to assist with disposition. Patient is a resident at [facility] living. Patients preference would be to return there. She receives therapy through the facility and uses a bedside commode and rolling walker. PT is recommending SNF. Patient unsure if she would be agreeable but give [facility] team permission to send out for offers in case she cannot immediately return to [facility]. Patient suffered a fall prior to admission. Patient also being followed by wound care nurse here related to BLE wounds. Patient insists that she get PT through [facility] but if dressing changes are needed and cannot be accomodated through facility we can try to secure home health.                 Expected Discharge Plan: Assisted Living Barriers to Discharge: Continued Medical Work up   Patient Goals and CMS Choice        Expected Discharge Plan and Services Expected Discharge Plan: Assisted Living   Discharge Planning Services: CM Consult   Living arrangements for the past 2 months: [place] Expected Discharge Date: 11/13/18                                    Prior Living Arrangements/Services Living arrangements for the past 2 months: [place] Lives with:: Facility Resident Patient language and need for interpreter reviewed:: Yes        Need for Family Participation in Patient Care: No (Comment)   Current home services: DME Criminal Activity/Legal Involvement Pertinent to Current Situation/Hospitalization: No - Comment as needed  Activities of Daily Living Home Assistive Devices/Equipment: Environmental consultant (specify type) ADL Screening (condition at time of admission) Patient's cognitive  ability adequate to safely complete daily activities?: No Is the patient deaf or have difficulty hearing?: Yes Does the patient have difficulty seeing, even when wearing glasses/contacts?: No Does the patient have difficulty concentrating, remembering, or making decisions?: Yes Patient able to express need for assistance with ADLs?: Yes Does the patient have difficulty dressing or bathing?: Yes Independently performs ADLs?: No Communication: Independent Dressing (OT): Needs assistance Is this a change from baseline?: Pre-admission baseline Grooming: Needs assistance Feeding: Independent Bathing: Needs assistance Is this a change from baseline?: Pre-admission baseline Toileting: Needs assistance Is this a change from baseline?: Pre-admission baseline In/Out Bed: Needs assistance Is this a change from baseline?: Pre-admission baseline Walks in Home: Needs assistance Is this a change from baseline?: Pre-admission baseline Does the patient have difficulty walking or climbing stairs?: Yes Weakness of Legs: Both Weakness of Arms/Hands: Both  Permission Sought/Granted                  Emotional Assessment Appearance:: Appears stated age Attitude/Demeanor/Rapport: Gracious Affect (typically observed): Pleasant Orientation: : Oriented to Self, Oriented to Place, Oriented to  Time, Oriented to Situation      Admission diagnosis:  Bilateral lower leg cellulitis [L03.116, L03.115] Closed nondisplaced fracture of proximal phalanx of left middle finger, initial encounter [S62.643A] Closed displaced fracture of fifth metacarpal bone of left hand, unspecified portion of metacarpal, initial encounter [S62.307A] Patient Active Problem List   Diagnosis Date Noted  . Bilateral lower  leg cellulitis 11/10/2018  . Acute gastric ulcer with hemorrhage 03/10/2018  . GI bleed 02/01/2018  . Altered mental status 09/17/2017  . Seizure (HCC) 08/07/2017  . Osteoporosis, post-menopausal 01/23/2016   . Gastroesophageal reflux disease 10/22/2015  . Mild depression (HCC) 10/22/2015  . H/O: GI bleed 07/30/2015  . History of deep venous thrombosis 07/30/2015  . History of pulmonary embolism 07/30/2015  . 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 S/P closed reduction of dislocated total hip prosthesis 03/19/2015  . Essential hypertension 03/23/2014  . Hyperlipidemia 03/23/2014  . Hypothyroidism 03/23/2014   PCP:  [employer]:   [facility] - [place], [place] - [address] [place] [address] Phone: [phone] Fax: [phone]     Social Determinants of Health (SDOH) Interventions    Readmission Risk Interventions Readmission Risk Prevention Plan 11/13/2018  Medication Screening Complete  Transportation Screening Complete  Some recent data might be hidden

## 2018-11-13 NOTE — Consult Note (Signed)
Pharmacy Antibiotic Note  Megan Donaldson is a 85 y.o. female admitted on 11/10/2018 with bilateral lower extremity cellulitis.  Pharmacy has been consulted for Unasyn dosing. She was originally started on vancomycin. Her renal function is slightly lower than what appears to be her baseline. This is day # 2 of antibiotic therapy, no leukocytosis or recent fevers. She shows an allergic reaction to amoxicillin but the reaction is documented as vomiting.  Plan: 09/27 @ 0500 CrCl 32.7 ml/min will readjust Unasyn dose to 3g IV q6h will continue to monitor.  Height: 5\' 3"  (160 cm) Weight: 197 lb (89.4 kg) IBW/kg (Calculated) : 52.4  Temp (24hrs), Avg:98 F (36.7 C), Min:97.5 F (36.4 C), Max:98.8 F (37.1 C)  Recent Labs  Lab 11/10/18 1953 11/11/18 0352 11/12/18 0448 11/13/18 0508  WBC 6.6  --  5.9 5.8  CREATININE 1.32* 1.32* 1.44* 1.24*  VANCORANDOM  --  21  --   --     Estimated Creatinine Clearance: 32.6 mL/min (A) (by C-G formula based on SCr of 1.24 mg/dL (H)).    Allergies  Allergen Reactions  . Amoxicillin     Has patient had a PCN reaction causing immediate rash, facial/tongue/throat swelling, SOB or lightheadedness with hypotension: Unknown Has patient had a PCN reaction causing severe rash involving mucus membranes or skin necrosis: Unknown Has patient had a PCN reaction that required hospitalization: Unknown Has patient had a PCN reaction occurring within the last 10 years: Unknown If all of the above answers are "NO", then may proceed with Cephalosporin use.   . Latex   . Lisinopril     Antimicrobials this admission: vancomycin 9/25 >> 9/26 Unasyn 9/26 >>   Microbiology results: 9/24 BCx: NGTD 9/24 SARS CoV-2: negative   Thank you for allowing pharmacy to be a part of this patient's care.  Tobie Lords, PharmD 11/13/2018 7:07 AM

## 2018-11-13 NOTE — Progress Notes (Addendum)
[employer] - [place] at [facility]                                                                                                                                                                                  Patient Demographics   Megan ShihRuth Donaldson, is a 85 y.o. female, DOB - 1927-12-17, YNW:[id number]  Admit date - 11/10/2018   Admitting Physician Jimmye NormanElizabeth Achieng Ouma, NP  Outpatient Primary MD for the patient is [facility]   LOS - 3  Subjective: Doing well this morning.  She is more alert and talkative.  States her left hand is not painful currently.  Review of Systems:   CONSTITUTIONAL: No documented fever. No fatigue, weakness. No weight gain, no weight loss.  EYES: No blurry or double vision.  ENT: No tinnitus. No postnasal drip. No redness of the oropharynx.  RESPIRATORY: No cough, no wheeze, no hemoptysis. No dyspnea.  CARDIOVASCULAR: No chest pain. No orthopnea. No palpitations. No syncope.  GASTROINTESTINAL: No nausea, no vomiting or diarrhea. No abdominal pain. No melena or hematochezia.  GENITOURINARY: No dysuria or hematuria.  ENDOCRINE: No polyuria or nocturia. No heat or cold intolerance.  HEMATOLOGY: No anemia.  Positive bruising. No bleeding.  INTEGUMENTARY: Bilateral lower extremity redness and erythema MUSCULOSKELETAL: No arthritis. No swelling. No gout. +left hand pain NEUROLOGIC: No numbness, tingling, or ataxia. No seizure-type activity.  PSYCHIATRIC: No anxiety. No insomnia. No ADD.   Vitals:   Vitals:   11/12/18 1347 11/12/18 2029 11/13/18 0502 11/13/18 0900  BP: 124/74 (!) 143/77 (!) 160/82 125/65  Pulse: 79 81 74   Resp: 16 18 20 18   Temp: 98.8 F (37.1 C) (!) 97.5 F (36.4 C) 97.6 F (36.4 C) (!) 97.4 F (36.3 C)  TempSrc: Oral Oral Oral Oral  SpO2: 94% 93% 91% 99%  Weight:      Height:        Wt Readings from Last 3 Encounters:  11/10/18 89.4 kg  09/01/18 89 kg  03/16/18 86.2 kg     Intake/Output Summary  (Last 24 hours) at 11/13/2018 1025 Last data filed at 11/13/2018 0608 Gross per 24 hour  Intake 1258.92 ml  Output 850 ml  Net 408.92 ml    Physical Exam:   GENERAL: Pleasant-appearing in no apparent distress.  HEENT: Atraumatic, normocephalic. Extraocular muscles are intact. Pupils equal and reactive to light. Sclerae anicteric. No conjunctival injection. No oropharyngeal erythema.  NECK: Supple. There is no jugular venous distention. No bruits, no lymphadenopathy, no thyromegaly.  HEART: Regular rate and rhythm,. No murmurs, no rubs, no clicks.  LUNGS: Clear to auscultation bilaterally. No rales or rhonchi. No wheezes.  ABDOMEN: Soft, flat,  nontender, nondistended. Has good bowel sounds. No hepatosplenomegaly appreciated.  EXTREMITIES: No evidence of any cyanosis, clubbing, or peripheral edema.  +2 pedal and radial pulses bilaterally. Left hand with splint and bandage in place. NEUROLOGIC: The patient is alert, awake, and oriented x3 with no focal motor or sensory deficits appreciated bilaterally.  SKIN: Bilateral lower extremity swelling and redness present.  Dry dressing in place over left lower extremity. Psych: Not anxious, depressed    Antibiotics   Anti-infectives (From admission, onward)   Start     Dose/Rate Route Frequency Ordered Stop   11/13/18 1000  vancomycin (VANCOCIN) IVPB 1000 mg/200 mL premix  Status:  Discontinued     1,000 mg 200 mL/hr over 60 Minutes Intravenous Every 48 hours 11/11/18 1019 11/11/18 1302   11/13/18 1000  vancomycin (VANCOCIN) IVPB 1000 mg/200 mL premix  Status:  Discontinued     1,000 mg 200 mL/hr over 60 Minutes Intravenous Every 48 hours 11/11/18 1333 11/12/18 0729   11/13/18 0715  Ampicillin-Sulbactam (UNASYN) 3 g in sodium chloride 0.9 % 100 mL IVPB     3 g 200 mL/hr over 30 Minutes Intravenous Every 6 hours 11/13/18 0707     11/12/18 1000  Ampicillin-Sulbactam (UNASYN) 3 g in sodium chloride 0.9 % 100 mL IVPB  Status:  Discontinued     3  g 200 mL/hr over 30 Minutes Intravenous Every 12 hours 11/12/18 0747 11/13/18 0707   11/11/18 0700  vancomycin (VANCOCIN) IVPB 1000 mg/200 mL premix     1,000 mg 200 mL/hr over 60 Minutes Intravenous  Once 11/11/18 0651 11/11/18 1100   11/11/18 0323  vancomycin variable dose per unstable renal function (pharmacist dosing)  Status:  Discontinued      Does not apply See admin instructions 11/11/18 0323 11/11/18 1019   11/10/18 2315  vancomycin (VANCOCIN) IVPB 1000 mg/200 mL premix  Status:  Discontinued     1,000 mg 200 mL/hr over 60 Minutes Intravenous  Once 11/10/18 2311 11/11/18 0012   11/10/18 2100  vancomycin (VANCOCIN) 1,500 mg in sodium chloride 0.9 % 500 mL IVPB     1,500 mg 250 mL/hr over 120 Minutes Intravenous  Once 11/10/18 2007 11/11/18 0010   11/10/18 2000  vancomycin (VANCOCIN) IVPB 1000 mg/200 mL premix  Status:  Discontinued     1,000 mg 200 mL/hr over 60 Minutes Intravenous  Once 11/10/18 1953 11/10/18 2007   11/10/18 2000  levofloxacin (LEVAQUIN) IVPB 750 mg     750 mg 100 mL/hr over 90 Minutes Intravenous  Once 11/10/18 1953 11/10/18 2206      Medications   Scheduled Meds: . acetaminophen  1,000 mg Oral BID  . aspirin EC  81 mg Oral Daily  . calcium-vitamin D  1 tablet Oral BID  . enoxaparin (LOVENOX) injection  40 mg Subcutaneous Q24H  . escitalopram  10 mg Oral Daily  . ferrous sulfate  325 mg Oral Q breakfast  . lamoTRIgine  37.5 mg Oral Daily  . lamoTRIgine  50 mg Oral QHS  . levothyroxine  75 mcg Oral Daily  . metoprolol succinate  25 mg Oral Daily  . neomycin-bacitracin-polymyxin  1 application Topical Daily  . pantoprazole  40 mg Oral BID  . senna-docusate  2 tablet Oral QHS  . simvastatin  40 mg Oral QHS  . traZODone  100 mg Oral QHS  . white petrolatum  1 application Topical TID   Continuous Infusions: . sodium chloride 10 mL (11/13/18 0949)  . ampicillin-sulbactam (UNASYN) IV 3  g (11/13/18 1010)   PRN Meds:.sodium chloride, polyethylene  glycol   Data Review:   Micro Results Recent Results (from the past 240 hour(s))  SARS Coronavirus 2 [facility] order, Performed in [facility] hospital lab) Nasopharyngeal Nasopharyngeal Swab     Status: None   Collection Time: 11/10/18  7:55 PM   Specimen: Nasopharyngeal Swab  Result Value Ref Range Status   SARS Coronavirus 2 NEGATIVE NEGATIVE Final    Comment: (NOTE) If result is NEGATIVE SARS-CoV-2 target nucleic acids are NOT DETECTED. The SARS-CoV-2 RNA is generally detectable in upper and lower  respiratory specimens during the acute phase of infection. The lowest  concentration of SARS-CoV-2 viral copies this assay can detect is 250  copies / mL. A negative result does not preclude SARS-CoV-2 infection  and should not be used as the sole basis for treatment or other  patient management decisions.  A negative result may occur with  improper specimen collection / handling, submission of specimen other  than nasopharyngeal swab, presence of viral mutation(s) within the  areas targeted by this assay, and inadequate number of viral copies  (<250 copies / mL). A negative result must be combined with clinical  observations, patient history, and epidemiological information. If result is POSITIVE SARS-CoV-2 target nucleic acids are DETECTED. The SARS-CoV-2 RNA is generally detectable in upper and lower  respiratory specimens dur ing the acute phase of infection.  Positive  results are indicative of active infection with SARS-CoV-2.  Clinical  correlation with patient history and other diagnostic information is  necessary to determine patient infection status.  Positive results do  not rule out bacterial infection or co-infection with other viruses. If result is PRESUMPTIVE POSTIVE SARS-CoV-2 nucleic acids MAY BE PRESENT.   A presumptive positive result was obtained on the submitted specimen  and confirmed on repeat testing.  While 2019 novel coronavirus  (SARS-CoV-2) nucleic  acids may be present in the submitted sample  additional confirmatory testing may be necessary for epidemiological  and / or clinical management purposes  to differentiate between  SARS-CoV-2 and other Sarbecovirus currently known to infect humans.  If clinically indicated additional testing with an alternate test  methodology [id number]) is advised. The SARS-CoV-2 RNA is generally  detectable in upper and lower respiratory sp ecimens during the acute  phase of infection. The expected result is Negative. Fact Sheet for Patients:  StrictlyIdeas.no Fact Sheet for Healthcare Providers: BankingDealers.co.za This test is not yet approved or cleared by the [place] FDA and has been authorized for detection and/or diagnosis of SARS-CoV-2 by FDA under an Emergency Use Authorization (EUA).  This EUA will remain in effect (meaning this test can be used) for the duration of the COVID-19 declaration under Section 564(b)(1) of the Act, 21 U.S.C. section 360bbb-3(b)(1), unless the authorization is terminated or revoked sooner. Performed at [facility], [address] [place], [place] [address]   Blood Culture (routine x 2)     Status: None (Preliminary result)   Collection Time: 11/10/18  8:23 PM   Specimen: BLOOD  Result Value Ref Range Status   Specimen Description BLOOD BLOOD RIGHT WRIST  Final   Special Requests   Final    BOTTLES DRAWN AEROBIC AND ANAEROBIC Blood Culture adequate volume   Culture   Final    NO GROWTH 3 DAYS Performed at [facility], [address] [place], [place] [address]    Report Status PENDING  Incomplete  Blood Culture (routine x 2)  Status: None (Preliminary result)   Collection Time: 11/10/18  8:23 PM   Specimen: BLOOD  Result Value Ref Range Status   Specimen Description BLOOD RIGHT ANTECUBITAL  Final   Special Requests   Final    BOTTLES DRAWN AEROBIC AND ANAEROBIC Blood Culture  adequate volume   Culture   Final    NO GROWTH 3 DAYS Performed at [facility], [address] [place], [place] [address]    Report Status PENDING  Incomplete    Radiology Reports Dg Forearm Left  Result Date: 11/10/2018 CLINICAL DATA:  Larey Seat a few days ago. Bruising to the hands and forearms. EXAM: LEFT FOREARM - 2 VIEW COMPARISON:  None. FINDINGS: There is deformity at the wrist with dorsal angulation of the distal radial articular surface. The ulna projects more volar than expected for normal anatomic alignment. These findings are likely chronic due to old fractures. No convincing acute fracture of the forearm. Elbow joint is normally aligned. There is dorsal subcutaneous soft tissue edema. IMPRESSION: No acute fracture or dislocation. Electronically Signed   By: Amie Portland M.D.   On: 11/10/2018 20:23   Dg Forearm Right  Result Date: 11/10/2018 CLINICAL DATA:  85 year old female with a history of fall EXAM: RIGHT FOREARM - 2 VIEW COMPARISON:  None. FINDINGS: Osteopenia. No acute displaced fracture of the radius or ulna. Degenerative changes at the wrist. No radiopaque foreign body. IMPRESSION: Negative for acute bony abnormality. Osteopenia Electronically Signed   By: Gilmer Mor D.O.   On: 11/10/2018 20:16   US Renal  Result Date: 11/12/2018 CLINICAL DATA:  Acute kidney injury. EXAM: RENAL / URINARY TRACT ULTRASOUND COMPLETE COMPARISON:  None. FINDINGS: Right Kidney: Renal measurements: 9.2 x 4.0 x 3.8 cm = volume: 74 mL. Cortical thinning with increased echogenicity. No hydronephrosis. Left Kidney: Renal measurements: 7.9 x 4.6 x 5.5 cm = volume: 106 mL. Cortical thinning with increased echogenicity. 2.2 cm cystic lesion in the upper pole has increased only minimally compared to a CT scan of 04/23/2010 when it measured 1.6 cm. Bladder: Appears normal for degree of bladder distention. IMPRESSION: Cortical thinning with increased cortical echogenicity in the kidneys bilaterally,  findings consistent with medical renal disease. No hydronephrosis. Electronically Signed   By: Kennith Center M.D.   On: 11/12/2018 11:29   US Venous Img Lower Bilateral  Result Date: 11/11/2018 CLINICAL DATA:  85 year old female with bilateral lower extremity edema EXAM: BILATERAL LOWER EXTREMITY VENOUS DOPPLER ULTRASOUND TECHNIQUE: Gray-scale sonography with graded compression, as well as color Doppler and duplex ultrasound were performed to evaluate the lower extremity deep venous systems from the level of the common femoral vein and including the common femoral, femoral, profunda femoral, popliteal and calf veins including the posterior tibial, peroneal and gastrocnemius veins when visible. The superficial great saphenous vein was also interrogated. Spectral Doppler was utilized to evaluate flow at rest and with distal augmentation maneuvers in the common femoral, femoral and popliteal veins. COMPARISON:  None. FINDINGS: RIGHT LOWER EXTREMITY Common Femoral Vein: No evidence of thrombus. Normal compressibility, respiratory phasicity and response to augmentation. Saphenofemoral Junction: No evidence of thrombus. Normal compressibility and flow on color Doppler imaging. Profunda Femoral Vein: No evidence of thrombus. Normal compressibility and flow on color Doppler imaging. Femoral Vein: No evidence of thrombus. Normal compressibility, respiratory phasicity and response to augmentation. Popliteal Vein: No evidence of thrombus. Normal compressibility, respiratory phasicity and response to augmentation. Calf Veins: No evidence of thrombus. Normal compressibility and flow on color Doppler imaging. Superficial  Great Saphenous Vein: No evidence of thrombus. Normal compressibility. Venous Reflux:  None. Other Findings: Small fluid collection in the popliteal fossa measures 1.9 x 1.0 x 2.6 cm. Imaging findings are most consistent with a Baker's cyst. LEFT LOWER EXTREMITY Common Femoral Vein: No evidence of thrombus.  Normal compressibility, respiratory phasicity and response to augmentation. Saphenofemoral Junction: No evidence of thrombus. Normal compressibility and flow on color Doppler imaging. Profunda Femoral Vein: No evidence of thrombus. Normal compressibility and flow on color Doppler imaging. Femoral Vein: No evidence of thrombus. Normal compressibility, respiratory phasicity and response to augmentation. Popliteal Vein: No evidence of thrombus. Normal compressibility, respiratory phasicity and response to augmentation. Calf Veins: No evidence of thrombus. Normal compressibility and flow on color Doppler imaging. Superficial Great Saphenous Vein: No evidence of thrombus. Normal compressibility. Venous Reflux:  None. Other Findings:  None. IMPRESSION: No evidence of deep venous thrombosis in either lower extremity. Small right-sided Baker's cyst. Electronically Signed   By: Malachy Moan M.D.   On: 11/11/2018 11:13   Dg Hand Complete Left  Result Date: 11/10/2018 CLINICAL DATA:  Larey Seat a few days ago. Bruising to the hands and forearms. EXAM: LEFT HAND - COMPLETE 3+ VIEW COMPARISON:  None. FINDINGS: Oblique fracture of the fifth metacarpal extending from the volar midshaft to the dorsal ulnar aspect of the base. No fracture comminution. Approximately 3 mm of ulna displacement of the distal fracture component. Small corner fracture from the volar radial aspect of the base of the proximal phalanx of the middle finger, which could be acute or chronic. No other fractures.  No bone lesions. Skeletal structures diffusely demineralized. There are arthropathic changes with joint space narrowing, involving the interphalangeal joints and the metacarpal phalangeal joints as well as the scaphoid, trapezium and trapezium first metacarpal articulations. There is volar subluxation of the proximal phalanges of the index and middle fingers in relation to the metacarpal heads. Findings may reflect combination of osteoarthritis as  well as an inflammatory arthritis such as rheumatoid arthritis. IMPRESSION: 1. Acute minimally displaced fracture of the fifth metacarpal. 2. Small corner fracture from the radial volar base of the proximal phalanx of the left middle finger which may be acute or chronic. 3. No other fractures or acute findings. Electronically Signed   By: Amie Portland M.D.   On: 11/10/2018 20:20   Dg Hand Complete Right  Result Date: 11/10/2018 CLINICAL DATA:  85 year old female with fall and right hand pain. EXAM: RIGHT HAND - COMPLETE 3+ VIEW COMPARISON:  None. FINDINGS: There is no acute fracture or dislocation. There is severe osteopenia. Degenerative changes of the base of the thumb and interphalangeal joints. There are arthritic changes of the second and third MCP joint with mild chronic volar subluxation. The soft tissues are grossly unremarkable. IMPRESSION: 1. No acute fracture or dislocation. 2. Osteopenia with degenerative changes. 3. Erosive changes of the second and third MCP joints. Electronically Signed   By: Elgie Collard M.D.   On: 11/10/2018 20:18     CBC Recent Labs  Lab 11/10/18 1953 11/12/18 0448 11/13/18 0508  WBC 6.6 5.9 5.8  HGB 9.8* 8.6* 8.8*  HCT 31.5* 27.4* 28.4*  PLT 246 229 229  MCV 86.8 85.9 86.3  MCH 27.0 27.0 26.7  MCHC 31.1 31.4 31.0  RDW 23.8* 23.9* 23.3*  LYMPHSABS 1.7  --   --   MONOABS 0.7  --   --   EOSABS 0.1  --   --   BASOSABS 0.0  --   --  Chemistries  Recent Labs  Lab 11/10/18 1953 11/11/18 0352 11/12/18 0448 11/13/18 0508  NA 140 142 140 141  K 4.2 3.7 3.9 3.4*  CL 103 105 104 107  CO2 28 26 26 26   GLUCOSE 88 86 84 87  BUN 29* 27* 24* 19  CREATININE 1.32* 1.32* 1.44* 1.24*  CALCIUM 10.0 9.4 9.5 9.1  AST 31  --   --   --   ALT 19  --   --   --   ALKPHOS 70  --   --   --   BILITOT 0.7  --   --   --    ------------------------------------------------------------------------------------------------------------------ estimated creatinine  clearance is 32.6 mL/min (A) (by C-G formula based on SCr of 1.24 mg/dL (H)). ------------------------------------------------------------------------------------------------------------------ No results for input(s): HGBA1C in the last 72 hours. ------------------------------------------------------------------------------------------------------------------ No results for input(s): CHOL, HDL, LDLCALC, TRIG, CHOLHDL, LDLDIRECT in the last 72 hours. ------------------------------------------------------------------------------------------------------------------ No results for input(s): TSH, T4TOTAL, T3FREE, THYROIDAB in the last 72 hours.  Invalid input(s): FREET3 ------------------------------------------------------------------------------------------------------------------ No results for input(s): VITAMINB12, FOLATE, FERRITIN, TIBC, IRON, RETICCTPCT in the last 72 hours.  Coagulation profile No results for input(s): INR, PROTIME in the last 168 hours.  No results for input(s): DDIMER in the last 72 hours.  Cardiac Enzymes No results for input(s): CKMB, TROPONINI, MYOGLOBIN in the last 168 hours.  Invalid input(s): CK ------------------------------------------------------------------------------------------------------------------ Invalid input(s): POCBNP    Assessment & Plan   Bilateral lower extremity cellulitis- improving.  She was hypothermic on admission, but not meeting sepsis criteria.  Hypothermia has resolved. -Continue Unasyn -Blood cultures with no growth -Local wound care  Displaced fracture left hand- secondary to mechanical fall -Splint in place -Seen by orthopedic surgery- plan for nonsurgical management.  Recommended splint and nonweightbearing through the hand. -Will need to follow-up with orthopedic surgery in 7-10 days -PT consult- recommending SNF  Hypokalemia -Replete and recheck  Acute on chronic CKD III- creatinine improved today after stopping  vancomycin -Received some IV fluids overnight -Avoid nephrotoxic agents -Renal ultrasound with medical renal disease  Seizures- no seizure-like activity this admission -Continue Lamictal  Hypertension- BP well controlled -Continue metoprolol  Hyperlipidemia -Continue home simvastatin  Hypothyroidism  -Continue home Synthroid  Chronic normocytic anemia- hemoglobin is low but at baseline. No active bleeding. -Monitor  DVT prophylaxis-Lovenox  Daughter, 9-10, updated on the plan.      Code Status Orders  (From admission, onward)         Start     Ordered   11/10/18 2310  Do not attempt resuscitation (DNR)  Continuous    Question Answer Comment  In the event of cardiac or respiratory ARREST Do not call a "code blue"   In the event of cardiac or respiratory ARREST Do not perform Intubation, CPR, defibrillation or ACLS   In the event of cardiac or respiratory ARREST Use medication by any route, position, wound care, and other measures to relive pain and suffering. May use oxygen, suction and manual treatment of airway obstruction as needed for comfort.      11/10/18 2311        Code Status History    Date Active Date Inactive Code Status Order ID Comments User Context   09/01/2018 0231 09/01/2018 2045 DNR 2046  [id number], MD Inpatient   02/01/2018 2107 02/03/2018 2129 DNR 2130  [id number], MD Inpatient   09/17/2017 2300 09/19/2017 1904 DNR 11/19/2017  [id number], MD Inpatient   08/07/2017 1553 08/08/2017 1938 DNR 08/10/2017  Milagros Loll, MD ED   08/07/2017 1528 08/07/2017 1553 DNR [id number]  Willy Eddy, MD ED   Advance Care Planning Activity    Advance Directive Documentation     Most Recent Value  Type of Advance Directive  Out of facility DNR (pink MOST or yellow form)  Pre-existing out of facility DNR order (yellow form or pink MOST form)  Pink MOST form placed in chart (order not valid for inpatient use)  "MOST" Form in Place?  -       Consults orthopedic surgery  DVT Prophylaxis  Lovenox Lab Results  Component Value Date   PLT 229 11/13/2018     Time Spent in minutes   38 min Greater than 50% of time spent in care coordination and counseling patient regarding the condition and plan of care.   Jinny Blossom Mayo M.D on 11/13/2018 at 10:25 AM  Between 7am to 6pm - Pager - [phone]  After 6pm go to [email or web address] - Social research officer, government  [employer]   Office  [phone]

## 2018-11-14 LAB — NOVEL CORONAVIRUS, NAA (HOSP ORDER, SEND-OUT TO REF LAB; TAT 18-24 HRS): SARS-CoV-2, NAA: NOT DETECTED

## 2018-11-14 LAB — MRSA PCR SCREENING: MRSA by PCR: NEGATIVE

## 2018-11-14 LAB — BASIC METABOLIC PANEL
Anion gap: 8 (ref 5–15)
BUN: 18 mg/dL (ref 8–23)
CO2: 28 mmol/L (ref 22–32)
Calcium: 9.4 mg/dL (ref 8.9–10.3)
Chloride: 106 mmol/L (ref 98–111)
Creatinine, Ser: 1.35 mg/dL — ABNORMAL HIGH (ref 0.44–1.00)
GFR calc Af Amer: 40 mL/min — ABNORMAL LOW (ref >60)
GFR calc non Af Amer: 35 mL/min — ABNORMAL LOW (ref >60)
Glucose, Bld: 88 mg/dL (ref 70–99)
Potassium: 3.5 mmol/L (ref 3.5–5.1)
Sodium: 142 mmol/L (ref 135–145)

## 2018-11-14 MED ORDER — LABETALOL HCL 5 MG/ML IV SOLN: 5.0000 mg | Freq: Four times a day (QID) | INTRAVENOUS | Status: DC | PRN

## 2018-11-14 MED ORDER — ENOXAPARIN SODIUM 30 MG/0.3ML ~~LOC~~ SOLN
30.0000 mg | SUBCUTANEOUS | Status: DC
  Administered 2018-11-14: 30 mg via SUBCUTANEOUS
  Filled 2018-11-14: qty 0.3

## 2018-11-14 NOTE — Care Management Important Message (Signed)
Important Message  Patient Details  Name: Megan Donaldson MRN: [id number] Date of Birth: February 03, 1928   Medicare Important Message Given:  Yes     Dannette Barbara 11/14/2018, 12:09 PM

## 2018-11-14 NOTE — Progress Notes (Signed)
[employer] [facility] [place] [facility]                                                                                                                                                                                  Patient Demographics   Megan Donaldson, is a 85 y.o. female, DOB - 06-25-1927, WUJ:[id number]  Admit date - 11/10/2018   Admitting Physician Jimmye Norman, NP  Outpatient Primary MD for the patient is [facility]   LOS - 4  Subjective: Patient has no concerns this morning.  She denies any shortness of breath or cough.  Per RN, she did better working with PT this morning compared to previous evaluations.  Review of Systems:   CONSTITUTIONAL: No documented fever. No fatigue, weakness. No weight gain, no weight loss.  EYES: No blurry or double vision.  ENT: No tinnitus. No postnasal drip. No redness of the oropharynx.  RESPIRATORY: No cough, no wheeze, no hemoptysis. No dyspnea.  CARDIOVASCULAR: No chest pain. No orthopnea. No palpitations. No syncope.  GASTROINTESTINAL: No nausea, no vomiting or diarrhea. No abdominal pain. No melena or hematochezia.  GENITOURINARY: No dysuria or hematuria.  ENDOCRINE: No polyuria or nocturia. No heat or cold intolerance.  HEMATOLOGY: No anemia.  Positive bruising. No bleeding.  INTEGUMENTARY: Bilateral lower extremity redness and erythema MUSCULOSKELETAL: No arthritis. No swelling. No gout. +left hand pain NEUROLOGIC: No numbness, tingling, or ataxia. No seizure-type activity.  PSYCHIATRIC: No anxiety. No insomnia. No ADD.   Vitals:   Vitals:   11/13/18 1244 11/13/18 2013 11/14/18 0519 11/14/18 0900  BP: 135/68 (!) 171/83 (!) 182/82 (!) 118/107  Pulse: 63 64 80 75  Resp: Temp: 97.6 F (36.4 C) 97.7 F (36.5 C) 97.8 F (36.6 C) (!) 97.5 F (36.4 C)  TempSrc: Oral Oral Oral Oral  SpO2: 97% 99% 95% 100%  Weight:      Height:        Wt Readings from Last 3 Encounters:  11/10/18 89.4  kg  09/01/18 89 kg  03/16/18 86.2 kg     Intake/Output Summary (Last 24 hours) at 11/14/2018 1259 Last data filed at 11/14/2018 0357 Gross per 24 hour  Intake 747.53 ml  Output 500 ml  Net 247.53 ml    Physical Exam:   GENERAL: Pleasant-appearing in no apparent distress.  HEENT: Atraumatic, normocephalic. Extraocular muscles are intact. Pupils equal and reactive to light. Sclerae anicteric. No conjunctival injection. No oropharyngeal erythema.  NECK: Supple. There is no jugular venous distention. No bruits, no lymphadenopathy, no thyromegaly.  HEART: Regular rate and rhythm,. No murmurs, no rubs, no clicks.  LUNGS: Clear to auscultation bilaterally.  No rales or rhonchi. No wheezes.  ABDOMEN: Soft, flat, nontender, nondistended. Has good bowel sounds. No hepatosplenomegaly appreciated.  EXTREMITIES: No evidence of any cyanosis, clubbing, or peripheral edema.  +2 pedal and radial pulses bilaterally. Left hand with splint and bandage in place. NEUROLOGIC: The patient is alert, awake, and oriented x3 with no focal motor or sensory deficits appreciated bilaterally.  SKIN: Bilateral lower extremity swelling and redness present.  Dry dressing in place over left lower extremity. Psych: Not anxious, depressed    Antibiotics   Anti-infectives (From admission, onward)   Start     Dose/Rate Route Frequency Ordered Stop   11/13/18 1000  vancomycin (VANCOCIN) IVPB 1000 mg/200 mL premix  Status:  Discontinued     1,000 mg 200 mL/hr over 60 Minutes Intravenous Every 48 hours 11/11/18 1019 11/11/18 1302   11/13/18 1000  vancomycin (VANCOCIN) IVPB 1000 mg/200 mL premix  Status:  Discontinued     1,000 mg 200 mL/hr over 60 Minutes Intravenous Every 48 hours 11/11/18 1333 11/12/18 0729   11/13/18 0715  Ampicillin-Sulbactam (UNASYN) 3 g in sodium chloride 0.9 % 100 mL IVPB     3 g 200 mL/hr over 30 Minutes Intravenous Every 6 hours 11/13/18 0707     11/12/18 1000  Ampicillin-Sulbactam (UNASYN) 3 g  in sodium chloride 0.9 % 100 mL IVPB  Status:  Discontinued     3 g 200 mL/hr over 30 Minutes Intravenous Every 12 hours 11/12/18 0747 11/13/18 0707   11/11/18 0700  vancomycin (VANCOCIN) IVPB 1000 mg/200 mL premix     1,000 mg 200 mL/hr over 60 Minutes Intravenous  Once 11/11/18 0651 11/11/18 1100   11/11/18 0323  vancomycin variable dose per unstable renal function (pharmacist dosing)  Status:  Discontinued      Does not apply See admin instructions 11/11/18 0323 11/11/18 1019   11/10/18 2315  vancomycin (VANCOCIN) IVPB 1000 mg/200 mL premix  Status:  Discontinued     1,000 mg 200 mL/hr over 60 Minutes Intravenous  Once 11/10/18 2311 11/11/18 0012   11/10/18 2100  vancomycin (VANCOCIN) 1,500 mg in sodium chloride 0.9 % 500 mL IVPB     1,500 mg 250 mL/hr over 120 Minutes Intravenous  Once 11/10/18 2007 11/11/18 0010   11/10/18 2000  vancomycin (VANCOCIN) IVPB 1000 mg/200 mL premix  Status:  Discontinued     1,000 mg 200 mL/hr over 60 Minutes Intravenous  Once 11/10/18 1953 11/10/18 2007   11/10/18 2000  levofloxacin (LEVAQUIN) IVPB 750 mg     750 mg 100 mL/hr over 90 Minutes Intravenous  Once 11/10/18 1953 11/10/18 2206      Medications   Scheduled Meds: . acetaminophen  1,000 mg Oral BID  . aspirin EC  81 mg Oral Daily  . calcium-vitamin D  1 tablet Oral BID  . enoxaparin (LOVENOX) injection  30 mg Subcutaneous Q24H  . escitalopram  10 mg Oral Daily  . ferrous sulfate  325 mg Oral Q breakfast  . lamoTRIgine  37.5 mg Oral Daily  . lamoTRIgine  50 mg Oral QHS  . levothyroxine  75 mcg Oral Daily  . metoprolol succinate  25 mg Oral Daily  . neomycin-bacitracin-polymyxin  1 application Topical Daily  . pantoprazole  40 mg Oral BID  . senna-docusate  2 tablet Oral QHS  . simvastatin  40 mg Oral QHS  . traZODone  100 mg Oral QHS  . white petrolatum  1 application Topical TID   Continuous Infusions: . sodium chloride  1,000 mL (11/14/18 1103)  . ampicillin-sulbactam (UNASYN) IV  3 g (11/14/18 0623)   PRN Meds:.sodium chloride, labetalol, polyethylene glycol   Data Review:   Micro Results Recent Results (from the past 240 hour(s))  SARS Coronavirus 2 [facility] order, Performed in [facility] hospital lab) Nasopharyngeal Nasopharyngeal Swab     Status: None   Collection Time: 11/10/18  7:55 PM   Specimen: Nasopharyngeal Swab  Result Value Ref Range Status   SARS Coronavirus 2 NEGATIVE NEGATIVE Final    Comment: (NOTE) If result is NEGATIVE SARS-CoV-2 target nucleic acids are NOT DETECTED. The SARS-CoV-2 RNA is generally detectable in upper and lower  respiratory specimens during the acute phase of infection. The lowest  concentration of SARS-CoV-2 viral copies this assay can detect is 250  copies / mL. A negative result does not preclude SARS-CoV-2 infection  and should not be used as the sole basis for treatment or other  patient management decisions.  A negative result may occur with  improper specimen collection / handling, submission of specimen other  than nasopharyngeal swab, presence of viral mutation(s) within the  areas targeted by this assay, and inadequate number of viral copies  (<250 copies / mL). A negative result must be combined with clinical  observations, patient history, and epidemiological information. If result is POSITIVE SARS-CoV-2 target nucleic acids are DETECTED. The SARS-CoV-2 RNA is generally detectable in upper and lower  respiratory specimens dur ing the acute phase of infection.  Positive  results are indicative of active infection with SARS-CoV-2.  Clinical  correlation with patient history and other diagnostic information is  necessary to determine patient infection status.  Positive results do  not rule out bacterial infection or co-infection with other viruses. If result is PRESUMPTIVE POSTIVE SARS-CoV-2 nucleic acids MAY BE PRESENT.   A presumptive positive result was obtained on the submitted specimen  and confirmed  on repeat testing.  While 2019 novel coronavirus  (SARS-CoV-2) nucleic acids may be present in the submitted sample  additional confirmatory testing may be necessary for epidemiological  and / or clinical management purposes  to differentiate between  SARS-CoV-2 and other Sarbecovirus currently known to infect humans.  If clinically indicated additional testing with an alternate test  methodology [phone]) is advised. The SARS-CoV-2 RNA is generally  detectable in upper and lower respiratory sp ecimens during the acute  phase of infection. The expected result is Negative. Fact Sheet for Patients:  BoilerBrush.com.cy Fact Sheet for Healthcare Providers: https://pope.com/ This test is not yet approved or cleared by the [place] FDA and has been authorized for detection and/or diagnosis of SARS-CoV-2 by FDA under an Emergency Use Authorization (EUA).  This EUA will remain in effect (meaning this test can be used) for the duration of the COVID-19 declaration under Section 564(b)(1) of the Act, 21 U.S.C. section 360bbb-3(b)(1), unless the authorization is terminated or revoked sooner. Performed at [facility], [address] [place], [place] [address]   Blood Culture (routine x 2)     Status: None (Preliminary result)   Collection Time: 11/10/18  8:23 PM   Specimen: BLOOD  Result Value Ref Range Status   Specimen Description BLOOD BLOOD RIGHT WRIST  Final   Special Requests   Final    BOTTLES DRAWN AEROBIC AND ANAEROBIC Blood Culture adequate volume   Culture   Final    NO GROWTH 4 DAYS Performed at [facility], [address], [place], [place] [address]    Report Status  PENDING  Incomplete  Blood Culture (routine x 2)     Status: None (Preliminary result)   Collection Time: 11/10/18  8:23 PM   Specimen: BLOOD  Result Value Ref Range Status   Specimen Description BLOOD RIGHT ANTECUBITAL  Final   Special  Requests   Final    BOTTLES DRAWN AEROBIC AND ANAEROBIC Blood Culture adequate volume   Culture   Final    NO GROWTH 4 DAYS Performed at [facility], [address], [place], [place] [address]    Report Status PENDING  Incomplete  Novel Coronavirus, NAA (Hosp order, Send-out to Ref Lab; TAT 18-24 hrs     Status: None   Collection Time: 11/13/18  2:09 PM   Specimen: Nasopharyngeal Swab; Respiratory  Result Value Ref Range Status   SARS-CoV-2, NAA NOT DETECTED NOT DETECTED Final    Comment: (NOTE) This nucleic acid amplification test was developed and its performance characteristics determined by [employer]. Nucleic acid amplification tests include PCR and TMA. This test has not been FDA cleared or approved. This test has been authorized by FDA under an Emergency Use Authorization (EUA). This test is only authorized for the duration of time the declaration that circumstances exist justifying the authorization of the emergency use of in vitro diagnostic tests for detection of SARS-CoV-2 virus and/or diagnosis of COVID-19 infection under section 564(b)(1) of the Act, 21 U.S.C. 371GGY-6(R) (1), unless the authorization is terminated or revoked sooner. When diagnostic testing is negative, the possibility of a false negative result should be considered in the context of a patient's recent exposures and the presence of clinical signs and symptoms consistent with COVID-19. An individual without symptoms of COVID- 19 and who is not shedding SARS-CoV-2 vi rus would expect to have a negative (not detected) result in this assay. Performed At: [facility] [address] [place], [place] [id number] Rush Farmer MD JK:[id number]    [place]  Final    Comment: Performed at [facility], [address] [place], [place] [address]  MRSA PCR Screening     Status: None   Collection Time: 11/13/18 11:15 PM   Specimen: Nasal Mucosa;  Nasopharyngeal  Result Value Ref Range Status   MRSA by PCR NEGATIVE NEGATIVE Final    Comment:        The GeneXpert MRSA Assay (FDA approved for NASAL specimens only), is one component of a comprehensive MRSA colonization surveillance program. It is not intended to diagnose MRSA infection nor to guide or monitor treatment for MRSA infections. Performed at [facility], [address], [place],  [address]     Radiology Reports Dg Forearm Left  Result Date: 11/10/2018 CLINICAL DATA:  [place] a few days ago. Bruising to the hands and forearms. EXAM: LEFT FOREARM - 2 VIEW COMPARISON:  None. FINDINGS: There is deformity at the wrist with dorsal angulation of the distal radial articular surface. The ulna projects more volar than expected for normal anatomic alignment. These findings are likely chronic due to old fractures. No convincing acute fracture of the forearm. Elbow joint is normally aligned. There is dorsal subcutaneous soft tissue edema. IMPRESSION: No acute fracture or dislocation. Electronically Signed   By: Lajean Manes M.D.   On: 11/10/2018 20:23   Dg Forearm Right  Result Date: 11/10/2018 CLINICAL DATA:  85 year old female with a history of fall EXAM: RIGHT FOREARM - 2 VIEW COMPARISON:  None. FINDINGS: Osteopenia. No acute displaced fracture of the radius or ulna. Degenerative changes  at the wrist. No radiopaque foreign body. IMPRESSION: Negative for acute bony abnormality. Osteopenia Electronically Signed   By: Gilmer Mor D.O.   On: 11/10/2018 20:16   US Renal  Result Date: 11/12/2018 CLINICAL DATA:  Acute kidney injury. EXAM: RENAL / URINARY TRACT ULTRASOUND COMPLETE COMPARISON:  None. FINDINGS: Right Kidney: Renal measurements: 9.2 x 4.0 x 3.8 cm = volume: 74 mL. Cortical thinning with increased echogenicity. No hydronephrosis. Left Kidney: Renal measurements: 7.9 x 4.6 x 5.5 cm = volume: 106 mL. Cortical thinning with increased echogenicity. 2.2 cm cystic  lesion in the upper pole has increased only minimally compared to a CT scan of 04/23/2010 when it measured 1.6 cm. Bladder: Appears normal for degree of bladder distention. IMPRESSION: Cortical thinning with increased cortical echogenicity in the kidneys bilaterally, findings consistent with medical renal disease. No hydronephrosis. Electronically Signed   By: Kennith Center M.D.   On: 11/12/2018 11:29   US Venous Img Lower Bilateral  Result Date: 11/11/2018 CLINICAL DATA:  85 year old female with bilateral lower extremity edema EXAM: BILATERAL LOWER EXTREMITY VENOUS DOPPLER ULTRASOUND TECHNIQUE: Gray-scale sonography with graded compression, as well as color Doppler and duplex ultrasound were performed to evaluate the lower extremity deep venous systems from the level of the common femoral vein and including the common femoral, femoral, profunda femoral, popliteal and calf veins including the posterior tibial, peroneal and gastrocnemius veins when visible. The superficial great saphenous vein was also interrogated. Spectral Doppler was utilized to evaluate flow at rest and with distal augmentation maneuvers in the common femoral, femoral and popliteal veins. COMPARISON:  None. FINDINGS: RIGHT LOWER EXTREMITY Common Femoral Vein: No evidence of thrombus. Normal compressibility, respiratory phasicity and response to augmentation. Saphenofemoral Junction: No evidence of thrombus. Normal compressibility and flow on color Doppler imaging. Profunda Femoral Vein: No evidence of thrombus. Normal compressibility and flow on color Doppler imaging. Femoral Vein: No evidence of thrombus. Normal compressibility, respiratory phasicity and response to augmentation. Popliteal Vein: No evidence of thrombus. Normal compressibility, respiratory phasicity and response to augmentation. Calf Veins: No evidence of thrombus. Normal compressibility and flow on color Doppler imaging. Superficial Great Saphenous Vein: No evidence of  thrombus. Normal compressibility. Venous Reflux:  None. Other Findings: Small fluid collection in the popliteal fossa measures 1.9 x 1.0 x 2.6 cm. Imaging findings are most consistent with a Baker's cyst. LEFT LOWER EXTREMITY Common Femoral Vein: No evidence of thrombus. Normal compressibility, respiratory phasicity and response to augmentation. Saphenofemoral Junction: No evidence of thrombus. Normal compressibility and flow on color Doppler imaging. Profunda Femoral Vein: No evidence of thrombus. Normal compressibility and flow on color Doppler imaging. Femoral Vein: No evidence of thrombus. Normal compressibility, respiratory phasicity and response to augmentation. Popliteal Vein: No evidence of thrombus. Normal compressibility, respiratory phasicity and response to augmentation. Calf Veins: No evidence of thrombus. Normal compressibility and flow on color Doppler imaging. Superficial Great Saphenous Vein: No evidence of thrombus. Normal compressibility. Venous Reflux:  None. Other Findings:  None. IMPRESSION: No evidence of deep venous thrombosis in either lower extremity. Small right-sided Baker's cyst. Electronically Signed   By: Malachy Moan M.D.   On: 11/11/2018 11:13   Dg Hand Complete Left  Result Date: 11/10/2018 CLINICAL DATA:  [place] a few days ago. Bruising to the hands and forearms. EXAM: LEFT HAND - COMPLETE 3+ VIEW COMPARISON:  None. FINDINGS: Oblique fracture of the fifth metacarpal extending from the volar midshaft to the dorsal ulnar aspect of the base. No fracture comminution. Approximately 3 mm  of ulna displacement of the distal fracture component. Small corner fracture from the volar radial aspect of the base of the proximal phalanx of the middle finger, which could be acute or chronic. No other fractures.  No bone lesions. Skeletal structures diffusely demineralized. There are arthropathic changes with joint space narrowing, involving the interphalangeal joints and the metacarpal  phalangeal joints as well as the scaphoid, trapezium and trapezium first metacarpal articulations. There is volar subluxation of the proximal phalanges of the index and middle fingers in relation to the metacarpal heads. Findings may reflect combination of osteoarthritis as well as an inflammatory arthritis such as rheumatoid arthritis. IMPRESSION: 1. Acute minimally displaced fracture of the fifth metacarpal. 2. Small corner fracture from the radial volar base of the proximal phalanx of the left middle finger which may be acute or chronic. 3. No other fractures or acute findings. Electronically Signed   By: Amie Portland M.D.   On: 11/10/2018 20:20   Dg Hand Complete Right  Result Date: 11/10/2018 CLINICAL DATA:  85 year old female with fall and right hand pain. EXAM: RIGHT HAND - COMPLETE 3+ VIEW COMPARISON:  None. FINDINGS: There is no acute fracture or dislocation. There is severe osteopenia. Degenerative changes of the base of the thumb and interphalangeal joints. There are arthritic changes of the second and third MCP joint with mild chronic volar subluxation. The soft tissues are grossly unremarkable. IMPRESSION: 1. No acute fracture or dislocation. 2. Osteopenia with degenerative changes. 3. Erosive changes of the second and third MCP joints. Electronically Signed   By: Elgie Collard M.D.   On: 11/10/2018 20:18     CBC Recent Labs  Lab 11/10/18 1953 11/12/18 0448 11/13/18 0508  WBC 6.6 5.9 5.8  HGB 9.8* 8.6* 8.8*  HCT 31.5* 27.4* 28.4*  PLT 246 229 229  MCV 86.8 85.9 86.3  MCH 27.0 27.0 26.7  MCHC 31.1 31.4 31.0  RDW 23.8* 23.9* 23.3*  LYMPHSABS 1.7  --   --   MONOABS 0.7  --   --   EOSABS 0.1  --   --   BASOSABS 0.0  --   --     Chemistries  Recent Labs  Lab 11/10/18 1953 11/11/18 0352 11/12/18 0448 11/13/18 0508 11/14/18 0535  NA 140 142 140 141 142  K 4.2 3.7 3.9 3.4* 3.5  CL 103 105 104 107 106  CO2 GLUCOSE 88 86 84 87 88  BUN 29* 27* 24* 19 18   CREATININE 1.32* 1.32* 1.44* 1.24* 1.35*  CALCIUM 10.0 9.4 9.5 9.1 9.4  AST 31  --   --   --   --   ALT 19  --   --   --   --   ALKPHOS 70  --   --   --   --   BILITOT 0.7  --   --   --   --    ------------------------------------------------------------------------------------------------------------------ estimated creatinine clearance is 30 mL/min (A) (by C-G formula based on SCr of 1.35 mg/dL (H)). ------------------------------------------------------------------------------------------------------------------ No results for input(s): HGBA1C in the last 72 hours. ------------------------------------------------------------------------------------------------------------------ No results for input(s): CHOL, HDL, LDLCALC, TRIG, CHOLHDL, LDLDIRECT in the last 72 hours. ------------------------------------------------------------------------------------------------------------------ No results for input(s): TSH, T4TOTAL, T3FREE, THYROIDAB in the last 72 hours.  Invalid input(s): FREET3 ------------------------------------------------------------------------------------------------------------------ No results for input(s): VITAMINB12, FOLATE, FERRITIN, TIBC, IRON, RETICCTPCT in the last 72 hours.  Coagulation profile No results for input(s): INR, PROTIME in the last 168 hours.  No  results for input(s): DDIMER in the last 72 hours.  Cardiac Enzymes No results for input(s): CKMB, TROPONINI, MYOGLOBIN in the last 168 hours.  Invalid input(s): CK ------------------------------------------------------------------------------------------------------------------ Invalid input(s): POCBNP    Assessment & Plan   Bilateral lower extremity cellulitis- improving.  She was hypothermic on admission, but not meeting sepsis criteria.  Hypothermia has resolved. -Continue Unasyn, plan to transition to Augmentin on discharge -Blood cultures with no growth -Local wound care  Displaced fracture  left hand- secondary to mechanical fall -Splint in place -Seen by orthopedic surgery- plan for nonsurgical management.  Recommended splint and nonweightbearing through the hand. -Will need to follow-up with orthopedic surgery in 7-10 days -PT consult- recommending SNF  Acute on chronic CKD III- creatinine has been stable -Avoid nephrotoxic agents -Renal ultrasound with medical renal disease  Seizures- no seizure-like activity this admission -Continue Lamictal  Hypertension- BP has been labile -Continue metoprolol  Hyperlipidemia -Continue home simvastatin  Hypothyroidism  -Continue home Synthroid  Chronic normocytic anemia- hemoglobin is low but at baseline. No active bleeding. -Monitor  DVT prophylaxis- Lovenox  Daughter, Lesle ReekBarb, updated on the plan. Daughter would like for patient to be able to go back to ALF if possible. PT recommending SNF. We will reach out to her ALF and see if they are able to take her back with her current needs. Likely discharge tomorrow.      Code Status Orders  (From admission, onward)         Start     Ordered   11/10/18 2310  Do not attempt resuscitation (DNR)  Continuous    Question Answer Comment  In the event of cardiac or respiratory ARREST Do not call a "code blue"   In the event of cardiac or respiratory ARREST Do not perform Intubation, CPR, defibrillation or ACLS   In the event of cardiac or respiratory ARREST Use medication by any route, position, wound care, and other measures to relive pain and suffering. May use oxygen, suction and manual treatment of airway obstruction as needed for comfort.      11/10/18 2311        Code Status History    Date Active Date Inactive Code Status Order ID Comments User Context   09/01/2018 0231 09/01/2018 2045 DNR [id number]  Donato HeinzHooten, James P, MD Inpatient   02/01/2018 2107 02/03/2018 2129 DNR [id number]  Ihor AustinPyreddy, Pavan, MD Inpatient   09/17/2017 2300 09/19/2017 1904 DNR [id number]  Cammy CopaMaier, Angela,  MD Inpatient   08/07/2017 1553 08/08/2017 1938 DNR [id number]  Milagros LollSudini, Srikar, MD ED   08/07/2017 1528 08/07/2017 1553 DNR [id number]  Willy Eddyobinson, Patrick, MD ED   Advance Care Planning Activity    Advance Directive Documentation     Most Recent Value  Type of Advance Directive  Out of facility DNR (pink MOST or yellow form)  Pre-existing out of facility DNR order (yellow form or pink MOST form)  Pink MOST form placed in chart (order not valid for inpatient use)  "MOST" Form in Place?  -      Consults orthopedic surgery  DVT Prophylaxis  Lovenox Lab Results  Component Value Date   PLT 229 11/13/2018     Time Spent in minutes   38 min Greater than 50% of time spent in care coordination and counseling patient regarding the condition and plan of care.   Jinny BlossomKaty D Mayo M.D on 11/14/2018 at 12:59 PM  Between 7am to 6pm - Pager - [phone]  After 6pm go to [email or web address] - password [email or web address]  [place]  [phone]

## 2018-11-14 NOTE — Progress Notes (Signed)
Physical Therapy Treatment Patient Details Name: MELANA HINGLE MRN: [id number] DOB: 12-Feb-1928 Today's Date: 11/14/2018    History of Present Illness Pt is an 85 y/o F admitted for cellulitis in bilateral LE's. Pt has non-surgical fracture of L hand 2/2 fall at home. Ortho cleared to work with PT; use of platform walker for weight bearing restrictions. PMH includes HTN, HLD, CHF, dementia, seizures, GERD, GI bleed, DVT, R THA.     PT Comments    Pt is progressing toward goals. Upon entry, pt reports that she is not in pain and is willing to participate to her best ability. Pt was able to perform therex safely with supervision from PT. Pt is able to perform bed mobility, transfers, and amb with min/mod assist. At this point, heavy verbal cueing is still required for the above tasks. Pt still demonstrates confusion with platform walker and knowledge of her precautions. Current follow-up care recommendations remains SNF as a result of the above.    Follow Up Recommendations  SNF     Equipment Recommendations  Rolling walker with 5" wheels(platform)    Recommendations for Other Services       Precautions / Restrictions Precautions Precautions: Fall Restrictions Weight Bearing Restrictions: Yes LUE Weight Bearing: Non weight bearing(weight bear through forearm)    Mobility  Bed Mobility Overal bed mobility: Needs Assistance Bed Mobility: Supine to Sit     Supine to sit: Min assist     General bed mobility comments: Pt performed bed mobility safely. Drops feet off EOB and requires min assist to pull trunk up straight. Pt has some difficulty scooting to the edge of the bed.   Transfers Overall transfer level: Needs assistance   Transfers: Sit to/from Stand Sit to Stand: Mod assist         General transfer comment: Pt requires cueing for use of platform walker and reminders to not put weight through hand. PT provided mod assist to reach stand and cueing for positioning in  front of recliner. Pt has near LOB when returning to recliner at end of tx.   Ambulation/Gait Ambulation/Gait assistance: Min assist Gait Distance (Feet): 40 Feet Assistive device: Left platform walker Gait Pattern/deviations: Step-through pattern     General Gait Details: Pt amb with slowed speed and a bit of confusion with the platform walker. Pt feels tired after the walk, but was able to amb distance of the room.    Stairs             Wheelchair Mobility    Modified Rankin (Stroke Patients Only)       Balance Overall balance assessment: Needs assistance Sitting-balance support: Feet supported Sitting balance-Leahy Scale: Fair     Standing balance support: Bilateral upper extremity supported Standing balance-Leahy Scale: Poor Standing balance comment: Pt able to maintain balance when amb, near LOB when returning to sit.                             Cognition Arousal/Alertness: Awake/alert Behavior During Therapy: WFL for tasks assessed/performed Overall Cognitive Status: History of cognitive impairments - at baseline                                        Exercises Other Exercises Other Exercises: Supine, 12 reps: AP, GS, QS, SAQ; supervision from PT Other Exercises: Seated EOB, 12 reps: LAQ,  marching; supervision from PT    General Comments General comments (skin integrity, edema, etc.): Skin tears on bilateral LE's still present; bandages in place      Pertinent Vitals/Pain Pain Assessment: No/denies pain    Home Living                      Prior Function            PT Goals (current goals can now be found in the care plan section) Acute Rehab PT Goals Patient Stated Goal: to return home PT Goal Formulation: With patient Time For Goal Achievement: 11/25/18 Potential to Achieve Goals: Fair Progress towards PT goals: Progressing toward goals    Frequency    Min 2X/week      PT Plan Current plan  remains appropriate    Co-evaluation              AM-PAC PT "6 Clicks" Mobility   Outcome Measure  Help needed turning from your back to your side while in a flat bed without using bedrails?: A Little Help needed moving from lying on your back to sitting on the side of a flat bed without using bedrails?: A Little Help needed moving to and from a bed to a chair (including a wheelchair)?: A Little Help needed standing up from a chair using your arms (e.g., wheelchair or bedside chair)?: A Little Help needed to walk in hospital room?: A Little Help needed climbing 3-5 steps with a railing? : Total 6 Click Score: 16    End of Session Equipment Utilized During Treatment: Gait belt Activity Tolerance: Patient limited by fatigue;Patient tolerated treatment well Patient left: in chair;with call bell/phone within reach;with chair alarm set Nurse Communication: Mobility status PT Visit Diagnosis: Unsteadiness on feet (R26.81);Muscle weakness (generalized) (M62.81);History of falling (Z91.81);Pain Pain - Right/Left: Right(bilat) Pain - part of body: Leg     Time: 5537-4827 PT Time Calculation (min) (ACUTE ONLY): 29 min  Charges:  $Gait Training: 8-22 mins $Therapeutic Exercise: 8-22 mins                     Araseli Sherry, SPT   Kylea Berrong 11/14/2018, 1:12 PM

## 2018-11-14 NOTE — TOC Progression Note (Signed)
Transition of Care [facility]) - Progression Note    Patient Details  Name: Megan Donaldson MRN: [id number] Date of Birth: December 08, 1927  Transition of Care [facility]) CM/SW Contact  Beverly Sessions, RN Phone Number: 11/14/2018, 4:55 PM  Clinical Narrative:     PT Still recommending SNF  Daughter agreeable to bedsearch.  If at all possible she would like patient to return to [facility] spoke with Sharee Pimple at [facility]. In patient's current state she will be unable to return.  They will send a staff member to the hospital to assess patient to see if she is appropriate to return  Level 2 [place] pending Bed search initiated   Expected Discharge Plan: Assisted Living Barriers to Discharge: Continued Medical Work up  Expected Discharge Plan and Services Expected Discharge Plan: Assisted Living   Discharge Planning Services: CM Consult   Living arrangements for the past 2 months: [place] Expected Discharge Date: 11/13/18                                     Social Determinants of Health (SDOH) Interventions    Readmission Risk Interventions Readmission Risk Prevention Plan 11/13/2018  Medication Screening Complete  Transportation Screening Complete  Some recent data might be hidden

## 2018-11-15 LAB — CULTURE, BLOOD (ROUTINE X 2)
Culture: NO GROWTH
Culture: NO GROWTH
Special Requests: ADEQUATE
Special Requests: ADEQUATE

## 2018-11-15 LAB — BASIC METABOLIC PANEL
Anion gap: 8 (ref 5–15)
BUN: 21 mg/dL (ref 8–23)
CO2: 27 mmol/L (ref 22–32)
Calcium: 9.3 mg/dL (ref 8.9–10.3)
Chloride: 109 mmol/L (ref 98–111)
Creatinine, Ser: 1.16 mg/dL — ABNORMAL HIGH (ref 0.44–1.00)
GFR calc Af Amer: 48 mL/min — ABNORMAL LOW (ref >60)
GFR calc non Af Amer: 42 mL/min — ABNORMAL LOW (ref >60)
Glucose, Bld: 86 mg/dL (ref 70–99)
Potassium: 3.7 mmol/L (ref 3.5–5.1)
Sodium: 144 mmol/L (ref 135–145)

## 2018-11-15 LAB — SARS CORONAVIRUS 2 BY RT PCR (HOSPITAL ORDER, PERFORMED IN ~~LOC~~ HOSPITAL LAB): SARS Coronavirus 2: NEGATIVE

## 2018-11-15 MED ORDER — ENOXAPARIN SODIUM 40 MG/0.4ML ~~LOC~~ SOLN: 40.0000 mg | SUBCUTANEOUS | Status: DC

## 2018-11-15 MED ORDER — DOXYCYCLINE HYCLATE 100 MG PO TBEC: 100.0000 mg | DELAYED_RELEASE_TABLET | Freq: Two times a day (BID) | ORAL | 0 refills | Status: DC

## 2018-11-15 NOTE — NC FL2 (Signed)
[place] LEVEL OF CARE SCREENING TOOL     IDENTIFICATION  Patient Name: Megan Donaldson Birthdate: Jul 30, 1927 Sex: female Admission Date (Current Location): 11/10/2018  [place] and [place] Number:  Engineering geologist and Address:  [facility], [address] [place], [place] [address]      Provider Number: [id number]  Attending Physician Name and Address:  Sela Hua, MD  Relative Name and Phone Number:  Ronnette Juniper Daughter [phone]  [phone]    Current Level of Care: Hospital Recommended Level of Care: [place] Prior Approval Number:    Date Approved/Denied:   PASRR Number: [id number]  Discharge Plan: SNF    Current Diagnoses: Patient Active Problem List   Diagnosis Date Noted  . Bilateral lower leg cellulitis 11/10/2018  . Acute gastric ulcer with hemorrhage 03/10/2018  . GI bleed 02/01/2018  . Altered mental status 09/17/2017  . Seizure ([place]) 08/07/2017  . 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 Gastroesophageal reflux disease 10/22/2015  . Mild depression ([place]) 10/22/2015  . H/O: GI bleed 07/30/2015  . History of deep venous thrombosis 07/30/2015  . History of pulmonary embolism 07/30/2015  . 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 S/P closed reduction of dislocated total hip prosthesis 03/19/2015  . Essential hypertension 03/23/2014  . Hyperlipidemia 03/23/2014  . Hypothyroidism 03/23/2014    Orientation RESPIRATION BLADDER Height & Weight     Self, Time, Situation, Place  Normal Continent Weight: 89.4 kg Height:  5\' 3"  (160 cm)  BEHAVIORAL SYMPTOMS/MOOD NEUROLOGICAL BOWEL NUTRITION STATUS      Continent Diet  AMBULATORY STATUS COMMUNICATION OF NEEDS Skin   Extensive Assist Verbally Skin abrasions, Other (Comment)(bilateral cellulitis wounds to lower extremities)                       Personal Care Assistance Level of Assistance  Bathing, Feeding,  Dressing, Total care Bathing Assistance: Limited assistance Feeding assistance: Independent Dressing Assistance: Maximum assistance Total Care Assistance: Maximum assistance   Functional Limitations Info  Sight, Hearing, Speech Sight Info: Adequate Hearing Info: Adequate Speech Info: Adequate    SPECIAL CARE FACTORS FREQUENCY  PT (By licensed PT), OT (By licensed OT)     PT Frequency: 5x per week OT Frequency: 5x per week            Contractures Contractures Info: Not present    Additional Factors Info  Allergies, Code Status Code Status Info: DNR Allergies Info: amoxicillin, latex, lisinopril           Medication List    TAKE these medications   acetaminophen 500 MG tablet Commonly known as: TYLENOL Take 1,000 mg by mouth 2 (two) times daily.   aspirin EC 81 MG tablet Take 81 mg by mouth daily.   Calcium 600-D 600-400 MG-UNIT Tabs Generic drug: Calcium Carbonate-Vitamin D3 Take 1 tablet by mouth 2 (two) times daily.   doxycycline 100 MG EC tablet Commonly known as: DORYX Take 1 tablet (100 mg total) by mouth 2 (two) times daily. For 5 more days   escitalopram 10 MG tablet Commonly known as: LEXAPRO Take 10 mg by mouth daily.   Ferrous Sulfate 142 (45 Fe) MG Tbcr Take 45 mg by mouth daily.   furosemide 20 MG tablet Commonly known as: LASIX Take 20 mg by mouth every other day.   lamoTRIgine 25 MG tablet Commonly known as: LAMICTAL Take 50 mg by mouth at bedtime.   lamoTRIgine 25 MG  tablet Commonly known as: LAMICTAL Take 37.5 mg by mouth daily.   levothyroxine 75 MCG tablet Commonly known as: SYNTHROID Take 75 mcg by mouth daily.   metoprolol succinate 25 MG 24 hr tablet Commonly known as: TOPROL-XL Take 1 tablet (25 mg total) by mouth daily.   neomycin-bacitracin-polymyxin Oint Commonly known as: NEOSPORIN Apply 1 application topically daily. (apply to left leg)   pantoprazole 40 MG tablet Commonly known as:  PROTONIX Take 40 mg by mouth 2 (two) times daily.   PEG 3350 17 g Pack Take 17 g by mouth daily as needed (moderate constipation).   senna-docusate 8.6-50 MG tablet Commonly known as: Senokot-S Take 2 tablets by mouth at bedtime.   simvastatin 40 MG tablet Commonly known as: ZOCOR Take 40 mg by mouth at bedtime.   traZODone 100 MG tablet Commonly known as: DESYREL Take 100 mg by mouth at bedtime.   white petrolatum Gel Commonly known as: VASELINE Apply 1 application topically 3 (three) times daily. Apply to lips     Discharge Medications: Please see discharge summary for a list of discharge medications.  Relevant Imaging Results:  Relevant Lab Results:   Additional Information ss [id number]  Chapman Fitch, RN

## 2018-11-15 NOTE — Discharge Summary (Addendum)
[facility] [place] at [facility]   PATIENT NAME: Megan Donaldson    MR#:  [id number]  DATE OF BIRTH:  01-11-1928  DATE OF ADMISSION:  11/10/2018   ADMITTING PHYSICIAN: Jimmye Norman, NP  DATE OF DISCHARGE: 11/15/18  PRIMARY CARE PHYSICIAN: [facility]   ADMISSION DIAGNOSIS:  Bilateral lower leg cellulitis [L03.116, L03.115] Closed nondisplaced fracture of proximal phalanx of left middle finger, initial encounter [S62.643A] Closed displaced fracture of fifth metacarpal bone of left hand, unspecified portion of metacarpal, initial encounter [S62.307A] DISCHARGE DIAGNOSIS:  Active Problems:   Bilateral lower leg cellulitis  SECONDARY DIAGNOSIS:   Past Medical History:  Diagnosis Date   CHF (congestive heart failure) (HCC)    Dementia (HCC)    GERD (gastroesophageal reflux disease)    Hyperlipidemia    Hypertension    Osteoporosis    Personal history of other venous thrombosis and embolism    Personal history of PE (pulmonary embolism)    HOSPITAL COURSE:   [name] is an 85 year old female who presented to the ED with recurrent falls.  In the ED, left hand x-ray showed an acute displaced fracture of the fifth metacarpal and proximal phalanx of the left middle finger.  She was also noted to have a left lower extremity ulcer with some cellulitis.  She was admitted for further management.  Bilateral lower extremity cellulitis with a left lower extremity ulcer- improved.  She was hypothermic on admission, but not meeting sepsis criteria.  Hypothermia has resolved. -Treated with Unasyn and then transitioned to doxycycline on discharge for a total 10-day course -Blood cultures with no growth -Patient will need continued wound care on discharge -PT recommended SNF, but patient and daughter prefer that patient return to ALF   Displaced fracture left hand- secondary to mechanical fall -Splint in place -Seen by orthopedic surgery plan for  nonsurgical management.  Recommended splint and nonweightbearing through the hand. -Will need to follow-up with orthopedic surgery in the next week   CKD III- initially with AKI, but this resolved. -Renal ultrasound with medical renal disease -Monitor creatinine as an outpatient   Seizures- no seizure-like activity this admission -Continued Lamictal   Hypertension BP has been labile -Continued metoprolol -Monitor BP closely as an outpatient   Hyperlipidemia -Continued home simvastatin   Hypothyroidism  -Continued home Synthroid  Chronic diastolic CHF- no signs of volume overload -Continued home lasix   Chronic normocytic anemia- hemoglobin is low but at baseline. No active bleeding. -Recheck CBC as an outpatient  DISCHARGE CONDITIONS:  Bilateral lower extremity cellulitis with left lower extremity ulcer Displaced fracture of the left hand CKD 3 Seizures Hypertension Hyperlipidemia Hypothyroidism Chronic normocytic anemia CONSULTS OBTAINED:  Treatment Team:  Christena Flake, MD DRUG ALLERGIES:   Allergies  Allergen Reactions   Amoxicillin     Has patient had a PCN reaction causing immediate rash, facial/tongue/throat swelling, SOB or lightheadedness with hypotension: Unknown Has patient had a PCN reaction causing severe rash involving mucus membranes or skin necrosis: Unknown Has patient had a PCN reaction that required hospitalization: Unknown Has patient had a PCN reaction occurring within the last 10 years: Unknown If all of the above answers are "NO", then may proceed with Cephalosporin use.    Latex    Lisinopril    DISCHARGE MEDICATIONS:   Allergies as of 11/15/2018       Reactions   Amoxicillin    Has patient had a PCN reaction causing immediate rash, facial/tongue/throat swelling, SOB or  lightheadedness with hypotension: Unknown Has patient had a PCN reaction causing severe rash involving mucus membranes or skin necrosis: Unknown Has patient had a PCN  reaction that required hospitalization: Unknown Has patient had a PCN reaction occurring within the last 10 years: Unknown If all of the above answers are "NO", then may proceed with Cephalosporin use.   Latex    Lisinopril         Medication List     TAKE these medications    acetaminophen 500 MG tablet Commonly known as: TYLENOL Take 1,000 mg by mouth 2 (two) times daily.   aspirin EC 81 MG tablet Take 81 mg by mouth daily.   Calcium 600-D 600-400 MG-UNIT Tabs Generic drug: Calcium Carbonate-Vitamin D3 Take 1 tablet by mouth 2 (two) times daily.   doxycycline 100 MG EC tablet Commonly known as: DORYX Take 1 tablet (100 mg total) by mouth 2 (two) times daily. For 5 more days   escitalopram 10 MG tablet Commonly known as: LEXAPRO Take 10 mg by mouth daily.   Ferrous Sulfate 142 (45 Fe) MG Tbcr Take 45 mg by mouth daily.   furosemide 20 MG tablet Commonly known as: LASIX Take 20 mg by mouth every other day.   lamoTRIgine 25 MG tablet Commonly known as: LAMICTAL Take 50 mg by mouth at bedtime.   lamoTRIgine 25 MG tablet Commonly known as: LAMICTAL Take 37.5 mg by mouth daily.   levothyroxine 75 MCG tablet Commonly known as: SYNTHROID Take 75 mcg by mouth daily.   metoprolol succinate 25 MG 24 hr tablet Commonly known as: TOPROL-XL Take 1 tablet (25 mg total) by mouth daily.   neomycin-bacitracin-polymyxin Oint Commonly known as: NEOSPORIN Apply 1 application topically daily. (apply to left leg)   pantoprazole 40 MG tablet Commonly known as: PROTONIX Take 40 mg by mouth 2 (two) times daily.   PEG 3350 17 g Pack Take 17 g by mouth daily as needed (moderate constipation).   senna-docusate 8.6-50 MG tablet Commonly known as: Senokot-S Take 2 tablets by mouth at bedtime.   simvastatin 40 MG tablet Commonly known as: ZOCOR Take 40 mg by mouth at bedtime.   traZODone 100 MG tablet Commonly known as: DESYREL Take 100 mg by mouth at bedtime.   white  petrolatum Gel Commonly known as: VASELINE Apply 1 application topically 3 (three) times daily. Apply to lips         DISCHARGE INSTRUCTIONS:  1.  Follow-up with PCP in 5 days 2.  Follow-up with orthopedic surgery in 1 week 3.  Take doxycycline twice a day, starting this evening and continuing for 5 more days DIET:  Cardiac diet DISCHARGE CONDITION:  Stable ACTIVITY:  Activity as tolerated- nonweightbearing to left hand, can bear weight on left forearm OXYGEN:  Home Oxygen: No.  Oxygen Delivery: room air DISCHARGE LOCATION:  nursing home   If you experience worsening of your admission symptoms, develop shortness of breath, life threatening emergency, suicidal or homicidal thoughts you must seek medical attention immediately by calling 911 or calling your MD immediately  if symptoms less severe.  You Must read complete instructions/literature along with all the possible adverse reactions/side effects for all the Medicines you take and that have been prescribed to you. Take any new Medicines after you have completely understood and accpet all the possible adverse reactions/side effects.   Please note  You were cared for by a hospitalist during your hospital stay. If you have any questions about your discharge medications or  the care you received while you were in the hospital after you are discharged, you can call the unit and asked to speak with the hospitalist on call if the hospitalist that took care of you is not available. Once you are discharged, your primary care physician will handle any further medical issues. Please note that NO REFILLS for any discharge medications will be authorized once you are discharged, as it is imperative that you return to your primary care physician (or establish a relationship with a primary care physician if you do not have one) for your aftercare needs so that they can reassess your need for medications and monitor your lab values.    On the day  of Discharge:  VITAL SIGNS:  Blood pressure (!) 153/66, pulse (!) 53, temperature (!) 97.5 F (36.4 C), temperature source Oral, resp. rate 18, height 5\' 3"  (1.6 m), weight 89.4 kg, SpO2 100 %. PHYSICAL EXAMINATION:  GENERAL: Pleasant-appearing in no apparent distress.  HEENT: Atraumatic, normocephalic. Extraocular muscles are intact. Pupils equal and reactive to light. Sclerae anicteric. No conjunctival injection. No oropharyngeal erythema.  NECK: Supple. There is no jugular venous distention. No bruits, no lymphadenopathy, no thyromegaly.  HEART: Regular rate and rhythm,. No murmurs, no rubs, no clicks.  LUNGS: Clear to auscultation bilaterally. No rales or rhonchi. No wheezes.  ABDOMEN: Soft, flat, nontender, nondistended. Has good bowel sounds. No hepatosplenomegaly appreciated.  EXTREMITIES: No evidence of any cyanosis, clubbing, or peripheral edema.  +2 pedal and radial pulses bilaterally. Left hand with splint and bandage in place. NEUROLOGIC: The patient is alert, awake, and oriented x3 with no focal motor or sensory deficits appreciated bilaterally.  SKIN: Bilateral lower extremity swelling and redness present.  Dry dressing in place over left lower extremity. Psych: Not anxious, depressed DATA REVIEW:   CBC Recent Labs  Lab 11/13/18 0508  WBC 5.8  HGB 8.8*  HCT 28.4*  PLT 229    Chemistries  Recent Labs  Lab 11/10/18 1953  11/15/18 0443  NA 140   < > 144  K 4.2   < > 3.7  CL 103   < > 109  CO2 28   < > 27  GLUCOSE 88   < > 86  BUN 29*   < > 21  CREATININE 1.32*   < > 1.16*  CALCIUM 10.0   < > 9.3  AST 31  --   --   ALT 19  --   --   ALKPHOS 70  --   --   BILITOT 0.7  --   --    < > = values in this interval not displayed.     Microbiology Results  Results for orders placed or performed during the hospital encounter of 11/10/18  SARS Coronavirus 2 [facility](Hospital order, Performed [facility]) Nasopharyngeal Nasopharyngeal Swab     Status: None    Collection Time: 11/10/18  7:55 PM   Specimen: Nasopharyngeal Swab  Result Value Ref Range Status   SARS Coronavirus 2 NEGATIVE NEGATIVE Final    Comment: (NOTE) If result is NEGATIVE SARS-CoV-2 target nucleic acids are NOT DETECTED. The SARS-CoV-2 RNA is generally detectable in upper and lower  respiratory specimens during the acute phase of infection. The lowest  concentration of SARS-CoV-2 viral copies this assay can detect is 250  copies / mL. A negative result does not preclude SARS-CoV-2 infection  and should not be used as the sole basis for treatment or other  patient management  decisions.  A negative result may occur with  improper specimen collection / handling, submission of specimen other  than nasopharyngeal swab, presence of viral mutation(s) within the  areas targeted by this assay, and inadequate number of viral copies  (<250 copies / mL). A negative result must be combined with clinical  observations, patient history, and epidemiological information. If result is POSITIVE SARS-CoV-2 target nucleic acids are DETECTED. The SARS-CoV-2 RNA is generally detectable in upper and lower  respiratory specimens dur ing the acute phase of infection.  Positive  results are indicative of active infection with SARS-CoV-2.  Clinical  correlation with patient history and other diagnostic information is  necessary to determine patient infection status.  Positive results do  not rule out bacterial infection or co-infection with other viruses. If result is PRESUMPTIVE POSTIVE SARS-CoV-2 nucleic acids MAY BE PRESENT.   A presumptive positive result was obtained on the submitted specimen  and confirmed on repeat testing.  While 2019 novel coronavirus  (SARS-CoV-2) nucleic acids may be present in the submitted sample  additional confirmatory testing may be necessary for epidemiological  and / or clinical management purposes  to differentiate between  SARS-CoV-2 and other Sarbecovirus  currently known to infect humans.  If clinically indicated additional testing with an alternate test  methodology [phone]) is advised. The SARS-CoV-2 RNA is generally  detectable in upper and lower respiratory sp ecimens during the acute  phase of infection. The expected result is Negative. Fact Sheet for Patients:  BoilerBrush.com.cy Fact Sheet for Healthcare Providers: https://pope.com/ This test is not yet approved or cleared by the [place] FDA and has been authorized for detection and/or diagnosis of SARS-CoV-2 by FDA under an Emergency Use Authorization (EUA).  This EUA will remain in effect (meaning this test can be used) for the duration of the COVID-19 declaration under Section 564(b)(1) of the Act, 21 U.S.C. section 360bbb-3(b)(1), unless the authorization is terminated or revoked sooner. Performed at [facility], [address] [place], [place] [address]   Blood Culture (routine x 2)     Status: None   Collection Time: 11/10/18  8:23 PM   Specimen: BLOOD  Result Value Ref Range Status   Specimen Description BLOOD BLOOD RIGHT WRIST  Final   Special Requests   Final    BOTTLES DRAWN AEROBIC AND ANAEROBIC Blood Culture adequate volume   Culture   Final    NO GROWTH 5 DAYS Performed at [facility], [address], [place], [place] [address]    Report Status 11/15/2018 FINAL  Final  Blood Culture (routine x 2)     Status: None   Collection Time: 11/10/18  8:23 PM   Specimen: BLOOD  Result Value Ref Range Status   Specimen Description BLOOD RIGHT ANTECUBITAL  Final   Special Requests   Final    BOTTLES DRAWN AEROBIC AND ANAEROBIC Blood Culture adequate volume   Culture   Final    NO GROWTH 5 DAYS Performed at [facility], [address] [place], [place] [address]    Report Status 11/15/2018 FINAL  Final  Novel Coronavirus, NAA (Hosp order, Send-out to Ref Lab; TAT 18-24 hrs      Status: None   Collection Time: 11/13/18  2:09 PM   Specimen: Nasopharyngeal Swab; Respiratory  Result Value Ref Range Status   SARS-CoV-2, NAA NOT DETECTED NOT DETECTED Final    Comment: (NOTE) This nucleic acid amplification test was developed and its performance characteristics determined by [employer].  Nucleic acid amplification tests include PCR and TMA. This test has not been FDA cleared or approved. This test has been authorized by FDA under an Emergency Use Authorization (EUA). This test is only authorized for the duration of time the declaration that circumstances exist justifying the authorization of the emergency use of in vitro diagnostic tests for detection of SARS-CoV-2 virus and/or diagnosis of COVID-19 infection under section 564(b)(1) of the Act, 21 U.S.C. 086VHQ-4(O) (1), unless the authorization is terminated or revoked sooner. When diagnostic testing is negative, the possibility of a false negative result should be considered in the context of a patient's recent exposures and the presence of clinical signs and symptoms consistent with COVID-19. An individual without symptoms of COVID- 19 and who is not shedding SARS-CoV-2 vi rus would expect to have a negative (not detected) result in this assay. Performed At: [facility] [address] [place], [place] [id number] Jolene Schimke MD LK:[id number]    Coronavirus Source NASOPHARYNGEAL  Final    Comment: Performed at [facility], [address] [place], [place] [address]  MRSA PCR Screening     Status: None   Collection Time: 11/13/18 11:15 PM   Specimen: Nasal Mucosa; Nasopharyngeal  Result Value Ref Range Status   MRSA by PCR NEGATIVE NEGATIVE Final    Comment:        The GeneXpert MRSA Assay (FDA approved for NASAL specimens only), is one component of a comprehensive MRSA colonization surveillance program. It is not intended to diagnose MRSA infection nor to guide or monitor  treatment for MRSA infections. Performed at [facility], [address], [place], [place] [address]     RADIOLOGY:  No results found.   Management plans discussed with the patient, family and they are in agreement.  CODE STATUS: DNR   TOTAL TIME TAKING CARE OF THIS PATIENT: 45 minutes.    Jinny Blossom Mayo M.D on 11/15/2018 at 11:43 AM  Between 7am to 6pm - Pager - [phone]  After 6pm go to [email or web address] - Social research officer, government  [facility] [place] [facility]  Office  [phone]  CC: Primary care physician; [facility]   Note: This dictation was prepared with Dragon dictation along with smaller phrase technology. Any transcriptional errors that result from this process are unintentional.

## 2018-11-15 NOTE — Progress Notes (Signed)
Megan Donaldson to be D/C'd [facility] per MD order.  Discussed prescriptions and follow up appointments with the patient. Prescriptions given to patient, medication list explained in detail. Pt verbalized understanding.  Allergies as of 11/15/2018      Reactions   Amoxicillin    Has patient had a PCN reaction causing immediate rash, facial/tongue/throat swelling, SOB or lightheadedness with hypotension: Unknown Has patient had a PCN reaction causing severe rash involving mucus membranes or skin necrosis: Unknown Has patient had a PCN reaction that required hospitalization: Unknown Has patient had a PCN reaction occurring within the last 10 years: Unknown If all of the above answers are "NO", then may proceed with Cephalosporin use.   Latex    Lisinopril       Medication List    TAKE these medications   acetaminophen 500 MG tablet Commonly known as: TYLENOL Take 1,000 mg by mouth 2 (two) times daily.   aspirin EC 81 MG tablet Take 81 mg by mouth daily.   Calcium 600-D 600-400 MG-UNIT Tabs Generic drug: Calcium Carbonate-Vitamin D3 Take 1 tablet by mouth 2 (two) times daily.   doxycycline 100 MG EC tablet Commonly known as: DORYX Take 1 tablet (100 mg total) by mouth 2 (two) times daily. For 5 more days   escitalopram 10 MG tablet Commonly known as: LEXAPRO Take 10 mg by mouth daily.   Ferrous Sulfate 142 (45 Fe) MG Tbcr Take 45 mg by mouth daily.   furosemide 20 MG tablet Commonly known as: LASIX Take 20 mg by mouth every other day.   lamoTRIgine 25 MG tablet Commonly known as: LAMICTAL Take 50 mg by mouth at bedtime.   lamoTRIgine 25 MG tablet Commonly known as: LAMICTAL Take 37.5 mg by mouth daily.   levothyroxine 75 MCG tablet Commonly known as: SYNTHROID Take 75 mcg by mouth daily.   metoprolol succinate 25 MG 24 hr tablet Commonly known as: TOPROL-XL Take 1 tablet (25 mg total) by mouth daily.   neomycin-bacitracin-polymyxin  Oint Commonly known as: NEOSPORIN Apply 1 application topically daily. (apply to left leg)   pantoprazole 40 MG tablet Commonly known as: PROTONIX Take 40 mg by mouth 2 (two) times daily.   PEG 3350 17 g Pack Take 17 g by mouth daily as needed (moderate constipation).   senna-docusate 8.6-50 MG tablet Commonly known as: Senokot-S Take 2 tablets by mouth at bedtime.   simvastatin 40 MG tablet Commonly known as: ZOCOR Take 40 mg by mouth at bedtime.   traZODone 100 MG tablet Commonly known as: DESYREL Take 100 mg by mouth at bedtime.   white petrolatum Gel Commonly known as: VASELINE Apply 1 application topically 3 (three) times daily. Apply to lips       Vitals:   11/15/18 0553 11/15/18 0626  BP: (!) 153/66   Pulse: (!) 53   Resp: 18   Temp:  (!) 97.5 F (36.4 C)  SpO2: 100%     Skin clean, dry and intact without evidence of skin break down, no evidence of skin tears noted. IV catheter discontinued intact. Site without signs and symptoms of complications. Dressing and pressure applied. Pt denies pain at this time. No complaints noted.  An After Visit Summary was printed and given to the patient. Patient escorted via stretcher via non-emergent EMS to assisted living facility.  Chuck Hint RN [facility] [employer]

## 2018-11-15 NOTE — TOC Progression Note (Signed)
Transition of Care [facility]) - Progression Note    Patient Details  Name: Megan Donaldson MRN: [id number] Date of Birth: 1927/10/28  Transition of Care [facility]) CM/SW Contact  Megan Sessions, RN Phone Number: 11/15/2018, 3:05 PM  Clinical Narrative:     Megan Donaldson and Megan Donaldson have come from [facility].  Completed assessment and determine that patient can [facility] to [facility] at discharge.   Will transport by EMS after covid results have returned  Patient open with [facility] home health.  Was to start wound care today, however patient was at the hospital.  Daughter confirms she would like to resume care with [facility].  Megan Donaldson with [facility] notified of discharge  Discharge packet on chart.  Bedside RN notified  Expected Discharge Plan: Assisted Living Barriers to Discharge: Continued Medical Work up  Expected Discharge Plan and Services Expected Discharge Plan: Assisted Living   Discharge Planning Services: CM Consult   Living arrangements for the past 2 months: [place] Expected Discharge Date: 11/15/18                                     Social Determinants of Health (SDOH) Interventions    Readmission Risk Interventions Readmission Risk Prevention Plan 11/13/2018  Medication Screening Complete  Transportation Screening Complete  Some recent data might be hidden

## 2018-11-28 ENCOUNTER — Emergency Department: Payer: Medicare Other

## 2018-11-28 ENCOUNTER — Emergency Department
Admission: EM | Admit: 2018-11-28 | Discharge: 2018-11-28 | Payer: Medicare Other | Attending: Emergency Medicine | Admitting: Emergency Medicine

## 2018-11-28 ENCOUNTER — Encounter: Payer: Self-pay | Admitting: Emergency Medicine

## 2018-11-28 ENCOUNTER — Other Ambulatory Visit: Payer: Self-pay

## 2018-11-28 DIAGNOSIS — S41112A Laceration without foreign body of left upper arm, initial encounter: Secondary | ICD-10-CM

## 2018-11-28 DIAGNOSIS — Y999 Unspecified external cause status: Secondary | ICD-10-CM | POA: Diagnosis not present

## 2018-11-28 DIAGNOSIS — Y9210 Unspecified residential institution as the place of occurrence of the external cause: Secondary | ICD-10-CM | POA: Diagnosis not present

## 2018-11-28 DIAGNOSIS — I509 Heart failure, unspecified: Secondary | ICD-10-CM | POA: Insufficient documentation

## 2018-11-28 DIAGNOSIS — Y939 Activity, unspecified: Secondary | ICD-10-CM | POA: Insufficient documentation

## 2018-11-28 DIAGNOSIS — Z7982 Long term (current) use of aspirin: Secondary | ICD-10-CM | POA: Insufficient documentation

## 2018-11-28 DIAGNOSIS — W19XXXA Unspecified fall, initial encounter: Secondary | ICD-10-CM

## 2018-11-28 DIAGNOSIS — W07XXXA Fall from chair, initial encounter: Secondary | ICD-10-CM | POA: Diagnosis not present

## 2018-11-28 DIAGNOSIS — I110 Hypertensive heart disease with heart failure: Secondary | ICD-10-CM | POA: Insufficient documentation

## 2018-11-28 DIAGNOSIS — S41102A Unspecified open wound of left upper arm, initial encounter: Secondary | ICD-10-CM | POA: Insufficient documentation

## 2018-11-28 DIAGNOSIS — Z79899 Other long term (current) drug therapy: Secondary | ICD-10-CM | POA: Diagnosis not present

## 2018-11-28 DIAGNOSIS — S4992XA Unspecified injury of left shoulder and upper arm, initial encounter: Secondary | ICD-10-CM | POA: Diagnosis present

## 2018-11-28 MED ORDER — DOXYCYCLINE HYCLATE 100 MG PO CAPS: 100.0000 mg | ORAL_CAPSULE | Freq: Two times a day (BID) | ORAL | 0 refills | Status: AC
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
## 2018-11-28 NOTE — ED Notes (Signed)
[facility] declines to pick pt up

## 2018-11-28 NOTE — Discharge Instructions (Addendum)
Keep the left skin tear clean, with nonstick dressing to facilitate healing  Follow-up with your doctor in the next 3 to 5 days as needed at the facility  We are going to extend your recent antibiotic course for 7 days to help facilitate further healing
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
## 2018-11-28 NOTE — ED Notes (Signed)
[facility]  CALLED  FOR  TRANSPORT 

## 2018-11-28 NOTE — ED Notes (Signed)
Pt discharged to [facility] via [facility]; report given to stephanie at [facility]. nad noted.

## 2019-01-03 ENCOUNTER — Emergency Department: Payer: Medicare Other

## 2019-01-03 ENCOUNTER — Other Ambulatory Visit: Payer: Self-pay

## 2019-01-03 ENCOUNTER — Inpatient Hospital Stay
Admission: EM | Admit: 2019-01-03 | Discharge: 2019-01-09 | DRG: 092 | Disposition: A | Discharge status: Home Health | Payer: Medicare Other | Source: Skilled Nursing Facility | Attending: Internal Medicine | Admitting: Internal Medicine

## 2019-01-03 ENCOUNTER — Observation Stay: Payer: Medicare Other

## 2019-01-03 DIAGNOSIS — Z91040 Latex allergy status: Secondary | ICD-10-CM

## 2019-01-03 DIAGNOSIS — Z880 Allergy status to penicillin: Secondary | ICD-10-CM

## 2019-01-03 DIAGNOSIS — E785 Hyperlipidemia, unspecified: Secondary | ICD-10-CM | POA: Diagnosis present

## 2019-01-03 DIAGNOSIS — L97809 Non-pressure chronic ulcer of other part of unspecified lower leg with unspecified severity: Secondary | ICD-10-CM | POA: Diagnosis present

## 2019-01-03 DIAGNOSIS — Z86711 Personal history of pulmonary embolism: Secondary | ICD-10-CM | POA: Diagnosis present

## 2019-01-03 DIAGNOSIS — Z66 Do not resuscitate: Secondary | ICD-10-CM | POA: Diagnosis present

## 2019-01-03 DIAGNOSIS — H1033 Unspecified acute conjunctivitis, bilateral: Secondary | ICD-10-CM

## 2019-01-03 DIAGNOSIS — F0390 Unspecified dementia without behavioral disturbance: Secondary | ICD-10-CM

## 2019-01-03 DIAGNOSIS — I130 Hypertensive heart and chronic kidney disease with heart failure and stage 1 through stage 4 chronic kidney disease, or unspecified chronic kidney disease: Secondary | ICD-10-CM | POA: Diagnosis present

## 2019-01-03 DIAGNOSIS — I5042 Chronic combined systolic (congestive) and diastolic (congestive) heart failure: Secondary | ICD-10-CM | POA: Diagnosis present

## 2019-01-03 DIAGNOSIS — Z79899 Other long term (current) drug therapy: Secondary | ICD-10-CM

## 2019-01-03 DIAGNOSIS — I509 Heart failure, unspecified: Secondary | ICD-10-CM | POA: Diagnosis present

## 2019-01-03 DIAGNOSIS — R9431 Abnormal electrocardiogram [ECG] [EKG]: Secondary | ICD-10-CM | POA: Diagnosis present

## 2019-01-03 DIAGNOSIS — K5900 Constipation, unspecified: Secondary | ICD-10-CM | POA: Diagnosis not present

## 2019-01-03 DIAGNOSIS — N390 Urinary tract infection, site not specified: Secondary | ICD-10-CM | POA: Diagnosis not present

## 2019-01-03 DIAGNOSIS — R001 Bradycardia, unspecified: Secondary | ICD-10-CM | POA: Diagnosis present

## 2019-01-03 DIAGNOSIS — E039 Hypothyroidism, unspecified: Secondary | ICD-10-CM | POA: Diagnosis present

## 2019-01-03 DIAGNOSIS — T50915A Adverse effect of multiple unspecified drugs, medicaments and biological substances, initial encounter: Secondary | ICD-10-CM | POA: Diagnosis present

## 2019-01-03 DIAGNOSIS — G9341 Metabolic encephalopathy: Secondary | ICD-10-CM | POA: Diagnosis not present

## 2019-01-03 DIAGNOSIS — Z7982 Long term (current) use of aspirin: Secondary | ICD-10-CM

## 2019-01-03 DIAGNOSIS — Z888 Allergy status to other drugs, medicaments and biological substances status: Secondary | ICD-10-CM

## 2019-01-03 DIAGNOSIS — A419 Sepsis, unspecified organism: Secondary | ICD-10-CM | POA: Diagnosis present

## 2019-01-03 DIAGNOSIS — Z96641 Presence of right artificial hip joint: Secondary | ICD-10-CM | POA: Diagnosis present

## 2019-01-03 DIAGNOSIS — R000 Tachycardia, unspecified: Secondary | ICD-10-CM | POA: Diagnosis present

## 2019-01-03 DIAGNOSIS — N179 Acute kidney failure, unspecified: Secondary | ICD-10-CM | POA: Diagnosis present

## 2019-01-03 DIAGNOSIS — G92 Toxic encephalopathy: Principal | ICD-10-CM | POA: Diagnosis present

## 2019-01-03 DIAGNOSIS — I959 Hypotension, unspecified: Secondary | ICD-10-CM | POA: Diagnosis present

## 2019-01-03 DIAGNOSIS — M810 Age-related osteoporosis without current pathological fracture: Secondary | ICD-10-CM | POA: Diagnosis present

## 2019-01-03 DIAGNOSIS — S81809A Unspecified open wound, unspecified lower leg, initial encounter: Secondary | ICD-10-CM | POA: Diagnosis present

## 2019-01-03 DIAGNOSIS — Z86718 Personal history of other venous thrombosis and embolism: Secondary | ICD-10-CM

## 2019-01-03 DIAGNOSIS — K219 Gastro-esophageal reflux disease without esophagitis: Secondary | ICD-10-CM | POA: Diagnosis present

## 2019-01-03 DIAGNOSIS — G9340 Encephalopathy, unspecified: Secondary | ICD-10-CM | POA: Diagnosis present

## 2019-01-03 DIAGNOSIS — Z6832 Body mass index (BMI) 32.0-32.9, adult: Secondary | ICD-10-CM

## 2019-01-03 DIAGNOSIS — Z8711 Personal history of peptic ulcer disease: Secondary | ICD-10-CM

## 2019-01-03 DIAGNOSIS — Z79890 Hormone replacement therapy (postmenopausal): Secondary | ICD-10-CM

## 2019-01-03 DIAGNOSIS — I110 Hypertensive heart disease with heart failure: Secondary | ICD-10-CM | POA: Diagnosis present

## 2019-01-03 DIAGNOSIS — E669 Obesity, unspecified: Secondary | ICD-10-CM | POA: Diagnosis present

## 2019-01-03 DIAGNOSIS — Z20828 Contact with and (suspected) exposure to other viral communicable diseases: Secondary | ICD-10-CM | POA: Diagnosis present

## 2019-01-03 DIAGNOSIS — N182 Chronic kidney disease, stage 2 (mild): Secondary | ICD-10-CM | POA: Diagnosis present

## 2019-01-03 DIAGNOSIS — R6520 Severe sepsis without septic shock: Secondary | ICD-10-CM

## 2019-01-03 DIAGNOSIS — T68XXXA Hypothermia, initial encounter: Secondary | ICD-10-CM | POA: Diagnosis present

## 2019-01-03 DIAGNOSIS — Z8249 Family history of ischemic heart disease and other diseases of the circulatory system: Secondary | ICD-10-CM

## 2019-01-03 DIAGNOSIS — H109 Unspecified conjunctivitis: Secondary | ICD-10-CM | POA: Diagnosis present

## 2019-01-03 DIAGNOSIS — Y929 Unspecified place or not applicable: Secondary | ICD-10-CM

## 2019-01-03 DIAGNOSIS — E860 Dehydration: Secondary | ICD-10-CM | POA: Diagnosis present

## 2019-01-03 LAB — URINALYSIS, COMPLETE (UACMP) WITH MICROSCOPIC
Bilirubin Urine: NEGATIVE
Glucose, UA: NEGATIVE mg/dL
Ketones, ur: NEGATIVE mg/dL
Nitrite: NEGATIVE
Protein, ur: 30 mg/dL — AB
RBC / HPF: >50 RBC/hpf — ABNORMAL HIGH (ref 0–5)
Specific Gravity, Urine: 1.01 (ref 1.005–1.030)
Squamous Epithelial / HPF: NONE SEEN (ref 0–5)
WBC, UA: >50 WBC/hpf — ABNORMAL HIGH (ref 0–5)
pH: 6 (ref 5.0–8.0)

## 2019-01-03 LAB — CBC WITH DIFFERENTIAL/PLATELET
Abs Immature Granulocytes: 0.03 10*3/uL (ref 0.00–0.07)
Basophils Absolute: 0 10*3/uL (ref 0.0–0.1)
Basophils Relative: 0 %
Eosinophils Absolute: 0 10*3/uL (ref 0.0–0.5)
Eosinophils Relative: 0 %
HCT: 34.2 % — ABNORMAL LOW (ref 36.0–46.0)
Hemoglobin: 10.8 g/dL — ABNORMAL LOW (ref 12.0–15.0)
Immature Granulocytes: 0 %
Lymphocytes Relative: 10 %
Lymphs Abs: 0.9 10*3/uL (ref 0.7–4.0)
MCH: 28.7 pg (ref 26.0–34.0)
MCHC: 31.6 g/dL (ref 30.0–36.0)
MCV: 91 fL (ref 80.0–100.0)
Monocytes Absolute: 0.7 10*3/uL (ref 0.1–1.0)
Monocytes Relative: 8 %
Neutro Abs: 7 10*3/uL (ref 1.7–7.7)
Neutrophils Relative %: 82 %
Platelets: 221 10*3/uL (ref 150–400)
RBC: 3.76 MIL/uL — ABNORMAL LOW (ref 3.87–5.11)
RDW: 19.5 % — ABNORMAL HIGH (ref 11.5–15.5)
WBC: 8.7 10*3/uL (ref 4.0–10.5)
nRBC: 0 % (ref 0.0–0.2)

## 2019-01-03 LAB — COMPREHENSIVE METABOLIC PANEL WITH GFR
ALT: 13 U/L (ref 0–44)
AST: 35 U/L (ref 15–41)
Albumin: 3.5 g/dL (ref 3.5–5.0)
Alkaline Phosphatase: 70 U/L (ref 38–126)
Anion gap: 12 (ref 5–15)
BUN: 28 mg/dL — ABNORMAL HIGH (ref 8–23)
CO2: 24 mmol/L (ref 22–32)
Calcium: 9.4 mg/dL (ref 8.9–10.3)
Chloride: 107 mmol/L (ref 98–111)
Creatinine, Ser: 1.8 mg/dL — ABNORMAL HIGH (ref 0.44–1.00)
GFR calc Af Amer: 28 mL/min — ABNORMAL LOW
GFR calc non Af Amer: 25 mL/min — ABNORMAL LOW
Glucose, Bld: 96 mg/dL (ref 70–99)
Potassium: 4.2 mmol/L (ref 3.5–5.1)
Sodium: 143 mmol/L (ref 135–145)
Total Bilirubin: 0.7 mg/dL (ref 0.3–1.2)
Total Protein: 6.7 g/dL (ref 6.5–8.1)

## 2019-01-03 LAB — LACTIC ACID, PLASMA
Lactic Acid, Venous: 2.6 mmol/L (ref 0.5–1.9)
Lactic Acid, Venous: 1.7 mmol/L (ref 0.5–1.9)
Lactic Acid, Venous: 0.9 mmol/L (ref 0.5–1.9)

## 2019-01-03 LAB — TROPONIN I (HIGH SENSITIVITY)
Troponin I (High Sensitivity): 11 ng/L (ref <18)
Troponin I (High Sensitivity): 10 ng/L (ref <18)

## 2019-01-03 LAB — PROTIME-INR
INR: 1 (ref 0.8–1.2)
Prothrombin Time: 13 seconds (ref 11.4–15.2)

## 2019-01-03 LAB — TSH: TSH: 7.168 u[IU]/mL — ABNORMAL HIGH (ref 0.350–4.500)

## 2019-01-03 LAB — PROCALCITONIN: Procalcitonin: <0.1 ng/mL

## 2019-01-03 LAB — APTT: aPTT: 33 seconds (ref 24–36)

## 2019-01-03 MED ORDER — SODIUM CHLORIDE 0.9 % IV SOLN
Freq: Once | INTRAVENOUS | Status: AC
  Administered 2019-01-03: 18:00:00 via INTRAVENOUS

## 2019-01-03 MED ORDER — ACETAMINOPHEN 325 MG PO TABS: 650.0000 mg | ORAL_TABLET | Freq: Four times a day (QID) | ORAL | Status: DC | PRN

## 2019-01-03 MED ORDER — LAMOTRIGINE 25 MG PO TABS
50.0000 mg | ORAL_TABLET | Freq: Every day | ORAL | Status: DC
  Administered 2019-01-04 – 2019-01-08 (×6): 50 mg via ORAL
  Filled 2019-01-03 (×6): qty 2

## 2019-01-03 MED ORDER — ENOXAPARIN SODIUM 30 MG/0.3ML ~~LOC~~ SOLN
30.0000 mg | SUBCUTANEOUS | Status: DC
  Administered 2019-01-04 (×2): 30 mg via SUBCUTANEOUS
  Filled 2019-01-03 (×2): qty 0.3

## 2019-01-03 MED ORDER — SENNOSIDES-DOCUSATE SODIUM 8.6-50 MG PO TABS
2.0000 | ORAL_TABLET | Freq: Every day | ORAL | Status: DC
  Administered 2019-01-04 – 2019-01-08 (×6): 2 via ORAL
  Filled 2019-01-03 (×6): qty 2

## 2019-01-03 MED ORDER — ONDANSETRON HCL 4 MG PO TABS: 4.0000 mg | ORAL_TABLET | Freq: Four times a day (QID) | ORAL | Status: DC | PRN

## 2019-01-03 MED ORDER — SODIUM CHLORIDE 0.9 % IV SOLN
INTRAVENOUS | Status: AC
  Administered 2019-01-04: via INTRAVENOUS

## 2019-01-03 MED ORDER — SIMVASTATIN 40 MG PO TABS
40.0000 mg | ORAL_TABLET | Freq: Every day | ORAL | Status: DC
  Administered 2019-01-04 – 2019-01-08 (×6): 40 mg via ORAL
  Filled 2019-01-03 (×4): qty 1
  Filled 2019-01-03: qty 4
  Filled 2019-01-03 (×3): qty 1

## 2019-01-03 MED ORDER — PANTOPRAZOLE SODIUM 40 MG PO TBEC
40.0000 mg | DELAYED_RELEASE_TABLET | Freq: Two times a day (BID) | ORAL | Status: DC
  Administered 2019-01-04 – 2019-01-09 (×12): 40 mg via ORAL
  Filled 2019-01-03 (×12): qty 1

## 2019-01-03 MED ORDER — SODIUM CHLORIDE 0.9 % IV SOLN
Freq: Once | INTRAVENOUS | Status: AC
  Administered 2019-01-03: 21:00:00 via INTRAVENOUS

## 2019-01-03 MED ORDER — HYDROCODONE-ACETAMINOPHEN 5-325 MG PO TABS: 1.0000 | ORAL_TABLET | ORAL | Status: DC | PRN

## 2019-01-03 MED ORDER — ONDANSETRON HCL 4 MG/2ML IJ SOLN: 4.0000 mg | Freq: Four times a day (QID) | INTRAMUSCULAR | Status: DC | PRN

## 2019-01-03 MED ORDER — ASPIRIN EC 81 MG PO TBEC
81.0000 mg | DELAYED_RELEASE_TABLET | Freq: Every day | ORAL | Status: DC
  Administered 2019-01-04 – 2019-01-09 (×6): 81 mg via ORAL
  Filled 2019-01-03 (×6): qty 1

## 2019-01-03 MED ORDER — ACETAMINOPHEN 650 MG RE SUPP: 650.0000 mg | Freq: Four times a day (QID) | RECTAL | Status: DC | PRN

## 2019-01-03 MED ORDER — SODIUM CHLORIDE 0.9 % IV SOLN
1.0000 g | INTRAVENOUS | Status: DC
  Administered 2019-01-04: 1 g via INTRAVENOUS
  Filled 2019-01-03: qty 10
  Filled 2019-01-03: qty 1

## 2019-01-03 MED ORDER — TRAZODONE HCL 100 MG PO TABS
100.0000 mg | ORAL_TABLET | Freq: Every day | ORAL | Status: DC
  Administered 2019-01-04 (×2): 100 mg via ORAL
  Filled 2019-01-03 (×2): qty 1

## 2019-01-03 MED ORDER — ESCITALOPRAM OXALATE 10 MG PO TABS
10.0000 mg | ORAL_TABLET | Freq: Every day | ORAL | Status: DC
  Administered 2019-01-04 – 2019-01-09 (×6): 10 mg via ORAL
  Filled 2019-01-03 (×6): qty 1

## 2019-01-03 MED ORDER — POLYETHYLENE GLYCOL 3350 17 G PO PACK: 17.0000 g | PACK | Freq: Every day | ORAL | Status: DC | PRN

## 2019-01-03 MED ORDER — LEVOTHYROXINE SODIUM 50 MCG PO TABS
75.0000 ug | ORAL_TABLET | Freq: Every day | ORAL | Status: DC
  Administered 2019-01-04 – 2019-01-09 (×6): 75 ug via ORAL
  Filled 2019-01-03 (×7): qty 2

## 2019-01-03 MED ORDER — SODIUM CHLORIDE 0.9 % IV SOLN
1.0000 g | Freq: Once | INTRAVENOUS | Status: AC
  Administered 2019-01-03: 1 g via INTRAVENOUS
  Filled 2019-01-03: qty 10

## 2019-01-03 MED ORDER — LAMOTRIGINE 25 MG PO TABS
37.5000 mg | ORAL_TABLET | Freq: Every day | ORAL | Status: DC
  Administered 2019-01-04 – 2019-01-09 (×6): 37.5 mg via ORAL
  Filled 2019-01-03 (×6): qty 2

## 2019-01-03 NOTE — ED Notes (Addendum)
Date and time results received: 01/03/19    Test: Lactic Acid  Critical Value: 2.6  Name of Provider Notified: Dr Archie Balboa  Orders: Increase NS to 500 ml/hr

## 2019-01-03 NOTE — ED Notes (Signed)
Pt checked and dry at this time. 

## 2019-01-03 NOTE — ED Notes (Signed)
X-ray at bedside

## 2019-01-03 NOTE — ED Notes (Signed)
Pts daughter updated on pt status. °

## 2019-01-03 NOTE — ED Notes (Signed)
Admitting Md at bedside

## 2019-01-03 NOTE — H&P (Addendum)
Megan Donaldson:[id number] DOB: Jun 08, 1927 DOA: 01/03/2019     PCP: Orvis Brill, [facility]   Outpatient Specialists:     GI Efrain Sella, MD;     Patient arrived to ER on 01/03/19 at [facility]  Patient coming from:   From facility [facility]  Chief Complaint:   Chief Complaint  Patient presents with   Hypotension    HPI: Megan Donaldson is a 85 y.o. female with medical history significant of dementia possibly vascular, CHF, HTN, HLD, GERD, osteoporosis history of PE/DVT, bleeding ulcer  Presented with   hypotension blood pressure initially 73/43 at the SNF.  Patient was given a bolus by EMS blood pressure up to 96/51 satting 97% on room air heart rate 56 respirations 12, noted to have temperature down to 93.7 axillary was found to have a wound on her left shin. Nursing staff patient seem to be altered from her baseline she herself denies any complaints.  The patient has significant dementia unable to provide detailed history. yesterday she was very confused per family  Last admit was in September for a fall and had a hand fracture  Has problems with constipation She has hx of hypotension and hypothermia Had to be hospitalized to [facility] in the past  She required warming blanket   Has hx of hospital induces psychosis  Infectious risk factors: none  In  ER RAPID COVID TEST  in house testing  Pending  Lab Results  Component Value Date   [place] 11/15/2018   [place] NOT DETECTED 11/13/2018   [place] NEGATIVE 11/10/2018   [place] NEGATIVE 08/31/2018    Regarding pertinent Chronic problems:    Hyperlipidemia -  on statins Zocor   HTN on toprolol   CHF diastolic/systolic/ combined - last echo 08/18/2200 (grade 1 diastolic dysfunction). LV EF: 65% -   70% On Lasix    Hypothyroidism:  Lab Results  Component Value Date   TSH 1.447 09/18/2017   on synthroid      CKD stage II - baseline Cr  1.2 Lab Results  Component Value Date     CREATININE 1.80 (H) 01/03/2019   CREATININE 1.16 (H) 11/15/2018   CREATININE 1.35 (H) 11/14/2018     While in ER: Beta lactic acid after 2.6 AKI with creatinine up to 1.8 Meeting sepsis parameters UA and urine culture obtained patient started Rocephin Chest x-ray unremarkable UA worrisome for UTI The following Work up has been ordered so far:  Orders Placed This Encounter  Procedures   Urine culture   SARS CORONAVIRUS 2 (TAT 6-24 HRS) Nasopharyngeal Nasopharyngeal Swab   DG Chest Portable 1 View   Lactic acid, plasma   CBC with Differential   Comprehensive metabolic panel   Urinalysis, Complete w Microscopic   Document Actual / Estimated Weight   Consult to hospitalist  ALL PATIENTS BEING ADMITTED/HAVING PROCEDURES NEED COVID-19 SCREENING     Following Medications were ordered in ER: Medications  0.9 %  sodium chloride infusion (has no administration in time range)  0.9 %  sodium chloride infusion ( Intravenous Rate/Dose Change 01/03/19 1847)  cefTRIAXone (ROCEPHIN) 1 g in sodium chloride 0.9 % 100 mL IVPB (0 g Intravenous Stopped 01/03/19 1949)        Consult Orders  (From admission, onward)         Start     Ordered   01/03/19 1915  Consult to hospitalist  ALL PATIENTS BEING ADMITTED/HAVING PROCEDURES NEED COVID-19 SCREENING  Once  Comments: ALL PATIENTS BEING ADMITTED/HAVING PROCEDURES NEED COVID-19 SCREENING  Provider:  (Not yet assigned)  Question Answer Comment  Place call to: hospitalist   Reason for Consult Admit   Diagnosis/Clinical Info for Consult: uti/sepsis      01/03/19 1915          Significant initial  Findings: Abnormal Labs Reviewed  LACTIC ACID, PLASMA - Abnormal; Notable for the following components:      Result Value   Lactic Acid, Venous 2.6 (*)    All other components within normal limits  CBC WITH DIFFERENTIAL/PLATELET - Abnormal; Notable for the following components:   RBC 3.76 (*)    Hemoglobin 10.8 (*)    HCT 34.2  (*)    RDW 19.5 (*)    All other components within normal limits  COMPREHENSIVE METABOLIC PANEL - Abnormal; Notable for the following components:   BUN 28 (*)    Creatinine, Ser 1.80 (*)    GFR calc non Af Amer 25 (*)    GFR calc Af Amer 28 (*)    All other components within normal limits  URINALYSIS, COMPLETE (UACMP) WITH MICROSCOPIC - Abnormal; Notable for the following components:   Color, Urine YELLOW (*)    APPearance CLOUDY (*)    Hgb urine dipstick MODERATE (*)    Protein, ur 30 (*)    Leukocytes,Ua LARGE (*)    RBC / HPF >50 (*)    WBC, UA >50 (*)    Bacteria, UA RARE (*)    All other components within normal limits  TSH - Abnormal; Notable for the following components:   TSH 7.168 (*)    All other components within normal limits    Otherwise labs showing:   Recent Labs  Lab 01/03/19 1736  NA 143  K 4.2  CO2 24  GLUCOSE 96  BUN 28*  CREATININE 1.80*  CALCIUM 9.4    Cr  Up from baseline see below Lab Results  Component Value Date   CREATININE 1.80 (H) 01/03/2019   CREATININE 1.16 (H) 11/15/2018   CREATININE 1.35 (H) 11/14/2018    Recent Labs  Lab 01/03/19 1736  AST 35  ALT 13  ALKPHOS 70  BILITOT 0.7  PROT 6.7  ALBUMIN 3.5   Lab Results  Component Value Date   CALCIUM 9.4 01/03/2019      WBC      Component Value Date/Time   WBC 8.7 01/03/2019 1736   ANC    Component Value Date/Time   NEUTROABS 7.0 01/03/2019 1736   ALC No components found for: LYMPHAB    Plt: Lab Results  Component Value Date   PLT 221 01/03/2019     Lactic Acid, Venous    Component Value Date/Time   LATICACIDVEN 1.7 01/03/2019 1920    Procalcitonin   Ordered   COVID-19 Labs  No results for input(s): DDIMER, FERRITIN, LDH, CRP in the last 72 hours.  Lab Results  Component Value Date   SARSCOV2NAA NEGATIVE 11/15/2018   SARSCOV2NAA NOT DETECTED 11/13/2018   [place] NEGATIVE 11/10/2018   [place] NEGATIVE 08/31/2018      HG/HCT  Down    from baseline see below    Component Value Date/Time   HGB 10.8 (L) 01/03/2019 1736   HGB 11.0 (L) 10/25/2011 1130   HCT 34.2 (L) 01/03/2019 1736   HCT 34.1 (L) 10/25/2011 1130     Troponin 13   ECG: Ordered Personally reviewed by me showing: HR : 68 Rhythm:  NSR,  no evidence of ischemic changes QTC 521    UA    evidence of UTI     Urine analysis:    Component Value Date/Time   COLORURINE YELLOW (A) 01/03/2019 1813   APPEARANCEUR CLOUDY (A) 01/03/2019 1813   APPEARANCEUR Hazy 10/25/2011 1219   LABSPEC 1.010 01/03/2019 1813   LABSPEC 1.023 10/25/2011 1219   PHURINE 6.0 01/03/2019 1813   GLUCOSEU NEGATIVE 01/03/2019 1813   GLUCOSEU Negative 10/25/2011 1219   HGBUR MODERATE (A) 01/03/2019 1813   BILIRUBINUR NEGATIVE 01/03/2019 1813   BILIRUBINUR Negative 10/25/2011 1219   KETONESUR NEGATIVE 01/03/2019 1813   PROTEINUR 30 (A) 01/03/2019 1813   NITRITE NEGATIVE 01/03/2019 1813   LEUKOCYTESUR LARGE (A) 01/03/2019 1813   LEUKOCYTESUR 1+ 10/25/2011 1219    Ordered    CXR - NON acute       ED Triage Vitals  Enc Vitals Group     BP 01/03/19 1737 107/60     Pulse Rate 01/03/19 1737 (!) 58     Resp 01/03/19 1737 14     Temp 01/03/19 1809 (!) 94.7 F (34.8 C)     Temp Source 01/03/19 1809 Rectal     SpO2 01/03/19 1737 99 %     Weight 01/03/19 1738 165 lb (74.8 kg)     Height 01/03/19 1738 5' 4"  (1.626 m)     Head Circumference --      Peak Flow --      Pain Score 01/03/19 1738 0     Pain Loc --      Pain Edu? --      Excl. in [place]? --   TMAX(24)@       Latest  Blood pressure (!) 128/58, pulse 61, temperature (!) 94.7 F (34.8 C), temperature source Rectal, resp. rate 14, height 5' 4"  (1.626 m), weight 74.8 kg, SpO2 95 %.     Hospitalist was called for admission for uti resulting sepsis  Review of Systems:    Pertinent positives include: fatigue, confusion   Constitutional:  No weight loss, night sweats, Fevers, chills,  weight loss  HEENT:  No  headaches, Difficulty swallowing,Tooth/dental problems,Sore throat,  No sneezing, itching, ear ache, nasal congestion, post nasal drip,  Cardio-vascular:  No chest pain, Orthopnea, PND, anasarca, dizziness, palpitations.no Bilateral lower extremity swelling  GI:  No heartburn, indigestion, abdominal pain, nausea, vomiting, diarrhea, change in bowel habits, loss of appetite, melena, blood in stool, hematemesis Resp:  no shortness of breath at rest. No dyspnea on exertion, No excess mucus, no productive cough, No non-productive cough, No coughing up of blood.No change in color of mucus.No wheezing. Skin:  no rash or lesions. No jaundice GU:  no dysuria, change in color of urine, no urgency or frequency. No straining to urinate.  No flank pain.  Musculoskeletal:  No joint pain or no joint swelling. No decreased range of motion. No back pain.  Psych:  No change in mood or affect. No depression or anxiety. No memory loss.  Neuro: no localizing neurological complaints, no tingling, no weakness, no double vision, no gait abnormality, no slurred speech, no confusion  All systems reviewed and apart from [place] all are negative  Past Medical History:   Past Medical History:  Diagnosis Date   CHF (congestive heart failure) (HCC)    Dementia (HCC)    GERD (gastroesophageal reflux disease)    Hyperlipidemia    Hypertension    Osteoporosis    Personal history of other venous thrombosis and embolism  Personal history of PE (pulmonary embolism)       Past Surgical History:  Procedure Laterality Date   ESOPHAGOGASTRODUODENOSCOPY N/A 02/02/2018   Procedure: ESOPHAGOGASTRODUODENOSCOPY (EGD);  Surgeon: Toledo, Benay Pike, MD;  Location: [facility] ENDOSCOPY;  Service: Gastroenterology;  Laterality: N/A;   HIP CLOSED REDUCTION Right 12/21/2014   Procedure: CLOSED REDUCTION HIP;  Surgeon: Earnestine Leys, MD;  Location: [facility] ORS;  Service: Orthopedics;  Laterality: Right;   HIP CLOSED REDUCTION  Right 09/01/2018   Procedure: CLOSED REDUCTION HIP;  Surgeon: Dereck Leep, MD;  Location: [facility] ORS;  Service: Orthopedics;  Laterality: Right;   TOTAL HIP ARTHROPLASTY Right    x3    Social History:  Ambulatory  walker       reports that she has never smoked. She has never used smokeless tobacco. She reports that she does not drink alcohol or use drugs.   Family History:   Family History  Problem Relation Age of Onset   CAD Father     Allergies: Allergies  Allergen Reactions   Amoxicillin     Has patient had a PCN reaction causing immediate rash, facial/tongue/throat swelling, SOB or lightheadedness with hypotension: Unknown Has patient had a PCN reaction causing severe rash involving mucus membranes or skin necrosis: Unknown Has patient had a PCN reaction that required hospitalization: Unknown Has patient had a PCN reaction occurring within the last 10 years: Unknown If all of the above answers are "NO", then may proceed with Cephalosporin use.    Latex    Lisinopril    Mometasone Furoate       Prior to Admission medications   Medication Sig Start Date End Date Taking? Authorizing Provider  acetaminophen (TYLENOL) 500 MG tablet Take 1,000 mg by mouth 2 (two) times daily.    Yes [provider]  aspirin EC 81 MG tablet Take 81 mg by mouth daily.   Yes [provider]  Calcium Carbonate-Vitamin D3 (CALCIUM 600-D) 600-400 MG-UNIT TABS Take 1 tablet by mouth 2 (two) times daily.   Yes [provider]  escitalopram (LEXAPRO) 10 MG tablet Take 10 mg by mouth daily. 07/21/17  Yes [provider]  Ferrous Sulfate 142 (45 Fe) MG TBCR Take 45 mg by mouth daily.   Yes [provider]  furosemide (LASIX) 20 MG tablet Take 20 mg by mouth daily.    Yes [provider]  lamoTRIgine (LAMICTAL) 25 MG tablet Take 37.5 mg by mouth daily.  01/17/18 01/17/19 Yes [provider]  lamoTRIgine (LAMICTAL) 25 MG tablet Take 50 mg  by mouth at bedtime.   Yes [provider]  levothyroxine (SYNTHROID, LEVOTHROID) 75 MCG tablet Take 75 mcg by mouth daily.   Yes [provider]  metoprolol succinate (TOPROL-XL) 25 MG 24 hr tablet Take 1 tablet (25 mg total) by mouth daily. 09/20/17  Yes Loletha Grayer, MD  neomycin-bacitracin-polymyxin (NEOSPORIN) OINT Apply 1 application topically daily. (apply to left leg)   Yes [provider]  pantoprazole (PROTONIX) 40 MG tablet Take 40 mg by mouth 2 (two) times daily.   Yes [provider]  Polyethylene Glycol 3350 (PEG 3350) 17 g PACK Take 17 g by mouth daily as needed (moderate constipation).    Yes [provider]  senna-docusate (SENOKOT-S) 8.6-50 MG tablet Take 2 tablets by mouth at bedtime.   Yes [provider]  simvastatin (ZOCOR) 40 MG tablet Take 40 mg by mouth at bedtime. 05/04/17  Yes [provider]  traZODone (DESYREL) 100  MG tablet Take 100 mg by mouth at bedtime.   Yes [provider]  white petrolatum (VASELINE) GEL Apply 1 application topically 3 (three) times daily. Apply to lips   Yes [provider]   Physical Exam: Blood pressure (!) 128/58, pulse 61, temperature (!) 94.7 F (34.8 C), temperature source Rectal, resp. rate 14, height 5' 4"  (1.626 m), weight 74.8 kg, SpO2 95 %. 1. General:  in No  Acute distress   Chronically ill   -appearing Conjunctivitis noted 2. Psychological: Alert and   Oriented to self 3. Head/ENT:    Dry Mucous Membranes                          Head Non traumatic, neck supple                           Poor Dentition 4. SKIN:   decreased Skin turgor,  Skin clean Dry and intact skin break down 5. Heart: Regular rate and rhythm no  Murmur, no Rub or gallop 6. Lungs:  no wheezes or crackles   7. Abdomen: Soft,  non-tender, Non distended  Obese bowel sounds present 8. Lower extremities: no clubbing, cyanosis,  edema 9. Neurologically Grossly intact, moving all 4  extremities equally  10. MSK: Normal range of motion   All other LABS:     Recent Labs  Lab 01/03/19 1736  WBC 8.7  NEUTROABS 7.0  HGB 10.8*  HCT 34.2*  MCV 91.0  PLT 221     Recent Labs  Lab 01/03/19 1736  NA 143  K 4.2  CL 107  CO2 24  GLUCOSE 96  BUN 28*  CREATININE 1.80*  CALCIUM 9.4     Recent Labs  Lab 01/03/19 1736  AST 35  ALT 13  ALKPHOS 70  BILITOT 0.7  PROT 6.7  ALBUMIN 3.5       Cultures:    Component Value Date/Time   SDES BLOOD BLOOD RIGHT WRIST 11/10/2018 2023   SDES BLOOD RIGHT ANTECUBITAL 11/10/2018 2023   SPECREQUEST  11/10/2018 2023    BOTTLES DRAWN AEROBIC AND ANAEROBIC Blood Culture adequate volume   SPECREQUEST  11/10/2018 2023    BOTTLES DRAWN AEROBIC AND ANAEROBIC Blood Culture adequate volume   CULT  11/10/2018 2023    NO GROWTH 5 DAYS Performed at [facility], [address], [place], [place] [address]    CULT  11/10/2018 2023    NO GROWTH 5 DAYS Performed at [facility], [address] [place], [place] [address]    REPTSTATUS 11/15/2018 FINAL 11/10/2018 2023   REPTSTATUS 11/15/2018 FINAL 11/10/2018 2023     Radiological Exams on Admission: Dg Chest Portable 1 View  Result Date: 01/03/2019 CLINICAL DATA:  Hypotension and weakness. EXAM: PORTABLE CHEST 1 VIEW COMPARISON:  09/18/2017 FINDINGS: The patient is rotated to the right on today's radiograph, reducing diagnostic sensitivity and specificity. Atherosclerotic calcification of the aortic arch. The lungs appear clear. No blunting of the costophrenic angles. Prominent asymmetric degenerative right glenohumeral arthropathy. Rim calcified splenic artery aneurysm as shown on remote cross-sectional imaging. IMPRESSION: 1. No active cardiopulmonary disease is radiographically apparent. 2. Atherosclerotic calcification of the aortic arch. 3. Prominent degenerative right glenohumeral arthropathy. 4. Chronic splenic artery aneurysm. Electronically Signed    By: Van Clines M.D.   On: 01/03/2019 19:06    Chart has been reviewed    Assessment/Plan   85  y.o. female with medical history significant of dementia CHF, HTN, HLD, GERD, osteoporosis history of PE/DVT Admitted for sepsis due to UTI  Present on Admission:  Acute metabolic encephalopathy -   - most likely multifactorial secondary to combination of  infection   mild dehydration secondary to decreased by mouth intake,  polypharmacy   - Will rehydrate   - treat underlining infection   - Hold contributing medications if able patient has significant history of dementia with history of sundowning while hospitalized  -This point family requesting more conservative approach  - neurological exam appears to be nonfocal but patient unable to cooperate fully cooperate   - no history of liver disease    Sepsis ([place]) -  -SIRS criteria met with hypothermia elevated lactic acid  With/without evidence of end organ damage such as  acute renal failure,    elevated lactic acid encephalopathy -Most likely source being urinary,   - Obtain serial lactic acid and procalcitonin level.  - Initiate IV antibiotics   - await results of blood and urine culture  - Rehydrate   -Covid testing pending Admit as  PUI    Hyperlipidemia - chronic stable, continue home meds   History of pulmonary embolism - no longer on anticoagulation  History of dementia with prior history of sundowning.  Will monitor for any sign of mental status changes.  Continue home medications treat as needed   Hypothyroidism - - Check TSH, T3 T4 given bradycardai, hypothermia continue home medications at current dose  Bradycardia - hold toprolol and check TSH   Acute lower UTI -  - treat with Rocephin         await results of urine culture and adjust antibiotic coverage as needed  Hx of Peptic disease - continue protonix  Hx of leg ulcers - wound care consult Will cover with rocephin  Hx of CHF - avoid over  aggressive fluid ressusitation hypotensive at baseline Would avoid fluid overload may need to be diuresed prior to discharge  Hx of Seizures? Continue Lamictal  Conjunctivitis will order erythromycin ointment  Prolonged qt - - will monitor on tele avoid QT prolonging medications, rehydrate correct electrolytes  Other plan as per orders.  DVT prophylaxis:    Lovenox     Code Status:    DNR/DNI   as per  family no aggressive interventions continue with IVF and antibiotics  I had personally discussed CODE STATUS with family   Family Communication:   Family not at  Bedside  plan of care was discussed on the phone with Daughter   Disposition Plan:                               Back to current facility when stable                                                Would benefit from PT/OT eval prior to DC  Ordered                   Swallow eval - SLP ordered                   Social Work  consulted  Nutrition    consulted                  Wound care  consulted                                     Consults called: none    Admission status:  ED Disposition    ED Disposition Condition Comment   Admit  The patient appears reasonably stabilized for admission considering the current resources, flow, and capabilities available in the ED at this time, and I doubt any other [facility] requiring further screening and/or treatment in the ED prior to admission is  present.       Obs      Level of care    Tele  Precautions: Admit as PUI given sepsis dyspnea Airborne and Contact precautions  PPE: Used by the provider:   P100  eye Goggles,  Gloves      Anetha Slagel 01/04/2019, 12:06 AM    [facility]     after 2 AM please page floor coverage PA If 7AM-7PM, please contact the day team taking care of the patient using [email or web address]

## 2019-01-03 NOTE — ED Notes (Signed)
Sam Rn and [place] EDt cleaned pt, pt had small BM. Pt repositioned and resting comfortably in bed.

## 2019-01-03 NOTE — ED Notes (Signed)
1 set of cultures sent to lab

## 2019-01-03 NOTE — Progress Notes (Signed)
CODE SEPSIS - PHARMACY COMMUNICATION  **Broad Spectrum Antibiotics should be administered within 1 hour of Sepsis diagnosis**  Time Code Sepsis Called/Page Received: 2256  Antibiotics Ordered: ceftriaxone  Time of 1st antibiotic administration: 1917  Additional action taken by pharmacy:   If necessary, Name of Provider/Nurse Contacted:     Tobie Lords ,PharmD Clinical Pharmacist  01/03/2019  11:14 PM

## 2019-01-03 NOTE — ED Provider Notes (Signed)
[facility] Emergency Department Provider Note  ____________________________________________   I have reviewed the triage vital signs and the nursing notes.   HISTORY  Chief Complaint Hypotension   History limited by and level 5 caveat due to: Dementia   HPI Megan Donaldson is a 85 y.o. female who presents to the emergency department today because of concern for low blood pressure.  Patient did arrive via EMS.  Patient does have dementia and cannot give a great history as to why she is in the emergency department today.  Per report patient was found to have low blood pressure by staff at living facility.  He also stated that the patient seemed altered from her baseline.  Patient was given IV fluids during transport with some improvement of blood pressure.  Patient herself does not have any complaints.  Records reviewed. Per medical record review patient has a history of CHF, dementia, HTN, HLD.   Past Medical History:  Diagnosis Date  . CHF (congestive heart failure) (HCC)   . Dementia (HCC)   . GERD (gastroesophageal reflux disease)   . Hyperlipidemia   . Hypertension   . Osteoporosis   . Personal history of other venous thrombosis and embolism   . Personal history of PE (pulmonary embolism)     Patient Active Problem List   Diagnosis Date Noted  . Bilateral lower leg cellulitis 11/10/2018  . Acute gastric ulcer with hemorrhage 03/10/2018  . GI bleed 02/01/2018  . Altered mental status 09/17/2017  . Seizure (HCC) 08/07/2017  . 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 Gastroesophageal reflux disease 10/22/2015  . Mild depression (HCC) 10/22/2015  . H/O: GI bleed 07/30/2015  . History of deep venous thrombosis 07/30/2015  . History of pulmonary embolism 07/30/2015  . 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 S/P closed reduction of dislocated total hip prosthesis 03/19/2015  . Essential hypertension 03/23/2014  . Hyperlipidemia  03/23/2014  . Hypothyroidism 03/23/2014    Past Surgical History:  Procedure Laterality Date  . ESOPHAGOGASTRODUODENOSCOPY N/A 02/02/2018   Procedure: ESOPHAGOGASTRODUODENOSCOPY (EGD);  Surgeon: Toledo, Boykin Nearing, MD;  Location: [facility] ENDOSCOPY;  Service: Gastroenterology;  Laterality: N/A;  . HIP CLOSED REDUCTION Right 12/21/2014   Procedure: CLOSED REDUCTION HIP;  Surgeon: Deeann Saint, MD;  Location: [facility] ORS;  Service: Orthopedics;  Laterality: Right;  . HIP CLOSED REDUCTION Right 09/01/2018   Procedure: CLOSED REDUCTION HIP;  Surgeon: Donato Heinz, MD;  Location: [facility] ORS;  Service: Orthopedics;  Laterality: Right;  . TOTAL HIP ARTHROPLASTY Right    x3    Prior to Admission medications   Medication Sig Start Date End Date Taking? Authorizing Provider  acetaminophen (TYLENOL) 500 MG tablet Take 1,000 mg by mouth 2 (two) times daily.     [provider]  aspirin EC 81 MG tablet Take 81 mg by mouth daily.    [provider]  Calcium Carbonate-Vitamin D3 (CALCIUM 600-D) 600-400 MG-UNIT TABS Take 1 tablet by mouth 2 (two) times daily.    [provider]  escitalopram (LEXAPRO) 10 MG tablet Take 10 mg by mouth daily. 07/21/17   [provider]  Ferrous Sulfate 142 (45 Fe) MG TBCR Take 45 mg by mouth daily.    [provider]  furosemide (LASIX) 20 MG tablet Take 20 mg by mouth every other day.    [provider]  lamoTRIgine (LAMICTAL) 25 MG tablet Take 37.5 mg by mouth daily.  01/17/18 01/17/19  [provider]  lamoTRIgine (  LAMICTAL) 25 MG tablet Take 50 mg by mouth at bedtime.    [provider]  levothyroxine (SYNTHROID, LEVOTHROID) 75 MCG tablet Take 75 mcg by mouth daily.    [provider]  metoprolol succinate (TOPROL-XL) 25 MG 24 hr tablet Take 1 tablet (25 mg total) by mouth daily. 09/20/17   Alford HighlandWieting, Richard, MD  neomycin-bacitracin-polymyxin (NEOSPORIN) OINT Apply 1 application topically daily. (apply  to left leg)    [provider]  pantoprazole (PROTONIX) 40 MG tablet Take 40 mg by mouth 2 (two) times daily.    [provider]  Polyethylene Glycol 3350 (PEG 3350) 17 g PACK Take 17 g by mouth daily as needed (moderate constipation).     [provider]  senna-docusate (SENOKOT-S) 8.6-50 MG tablet Take 2 tablets by mouth at bedtime.    [provider]  simvastatin (ZOCOR) 40 MG tablet Take 40 mg by mouth at bedtime. 05/04/17   [provider]  traZODone (DESYREL) 100 MG tablet Take 100 mg by mouth at bedtime.    [provider]  white petrolatum (VASELINE) GEL Apply 1 application topically 3 (three) times daily. Apply to lips    [provider]    Allergies Amoxicillin, Latex, and Lisinopril  Family History  Problem Relation Age of Onset  . CAD Father     Social History Social History   Tobacco Use  . Smoking status: Never Smoker  . Smokeless tobacco: Never Used  Substance Use Topics  . Alcohol use: No  . Drug use: No    Review of Systems Constitutional: No fever/chills Eyes: No visual changes. ENT: No sore throat. Cardiovascular: Denies chest pain. Respiratory: Denies shortness of breath. Gastrointestinal: No abdominal pain.  No nausea, no vomiting.  No diarrhea.   Genitourinary: Negative for dysuria. Musculoskeletal: Negative for back pain. Skin: Negative for rash. Neurological: Negative for headaches, focal weakness or numbness.  ____________________________________________   PHYSICAL EXAM:  VITAL SIGNS: ED Triage Vitals  Enc Vitals Group     BP 01/03/19 1737 107/60     Pulse Rate 01/03/19 1737 (!) 58     Resp 01/03/19 1737 14     Temp 01/03/19 1809 (!) 94.7 F (34.8 C)     Temp Source 01/03/19 1809 Rectal     SpO2 01/03/19 1737 99 %     Weight 01/03/19 1738 165 lb (74.8 kg)     Height 01/03/19 1738 5\' 4"  (1.626 m)     Head Circumference --      Peak Flow --      Pain Score 01/03/19 1738 0    Constitutional: Awake and alert. Not completely oriented.  Eyes: Conjunctivae are normal.  ENT      Head: Normocephalic and atraumatic.      Nose: No congestion/rhinnorhea.      Mouth/Throat: Mucous membranes are moist.      Neck: No stridor. Hematological/Lymphatic/Immunilogical: No cervical lymphadenopathy. Cardiovascular: Normal rate, regular rhythm.  No murmurs, rubs, or gallops.  Respiratory: Normal respiratory effort without tachypnea nor retractions. Breath sounds are clear and equal bilaterally. No wheezes/rales/rhonchi. Gastrointestinal: Soft and non tender. No rebound. No guarding.  Genitourinary: Deferred Musculoskeletal: Normal range of motion in all extremities. No lower extremity edema. Neurologic:  Normal speech and language. No gross focal neurologic deficits are appreciated.  Skin:  Skin is warm, dry and intact. No rash noted. Psychiatric: Mood and affect are normal. Speech and behavior are normal. Patient exhibits appropriate insight and judgment.  ____________________________________________  LABS (pertinent positives/negatives)  CBC wbc 8.7, hgb 10.8, plt 221 Trop hs 11 Lactic acid 2.6 CMP wnl except bun 28, cr 1.80  ____________________________________________   EKG  None  ____________________________________________    RADIOLOGY  CXR No acute abnormality  ____________________________________________   PROCEDURES  Procedures  ____________________________________________   INITIAL IMPRESSION / ASSESSMENT AND PLAN / ED COURSE  Pertinent labs & imaging results that were available during my care of the patient were reviewed by me and considered in my medical decision making (see chart for details).   Patient presented to the emergency department today from living facility because of concerns for low blood pressure and altered mental status.  Patient's blood pressure had improved by the time she arrived to the emergency department.  She  was found to be somewhat hypothermic.  Did have concerns for possible infection.  Chest x-ray without pneumonia.  Urine does appear to be consistent with a urinary tract infection.  Patient did have lactic acidosis.  Patient was given IV fluids and IV antibiotics here in the emergency department will plan on admission.  ____________________________________________   FINAL CLINICAL IMPRESSION(S) / ED DIAGNOSES  Final diagnoses:  Lower urinary tract infectious disease     Note: This dictation was prepared with Dragon dictation. Any transcriptional errors that result from this process are unintentional     Nance Pear, MD 01/03/19 1919

## 2019-01-03 NOTE — ED Triage Notes (Signed)
Pt here via ems from [facility] for a bp of 73/43.  Ems gave pt 932ml fluid. After fluids bp was 96/51. 97% RA, HR 56, 12 RR, te,p 93.7 axillary.   Wound present on left shin.

## 2019-01-04 DIAGNOSIS — I5042 Chronic combined systolic (congestive) and diastolic (congestive) heart failure: Secondary | ICD-10-CM | POA: Diagnosis present

## 2019-01-04 DIAGNOSIS — R000 Tachycardia, unspecified: Secondary | ICD-10-CM | POA: Diagnosis present

## 2019-01-04 DIAGNOSIS — I110 Hypertensive heart disease with heart failure: Secondary | ICD-10-CM | POA: Diagnosis present

## 2019-01-04 DIAGNOSIS — G9341 Metabolic encephalopathy: Secondary | ICD-10-CM | POA: Diagnosis not present

## 2019-01-04 DIAGNOSIS — F0390 Unspecified dementia without behavioral disturbance: Secondary | ICD-10-CM

## 2019-01-04 DIAGNOSIS — T68XXXA Hypothermia, initial encounter: Secondary | ICD-10-CM | POA: Diagnosis present

## 2019-01-04 DIAGNOSIS — I9589 Other hypotension: Secondary | ICD-10-CM

## 2019-01-04 DIAGNOSIS — H109 Unspecified conjunctivitis: Secondary | ICD-10-CM | POA: Diagnosis present

## 2019-01-04 DIAGNOSIS — Z8711 Personal history of peptic ulcer disease: Secondary | ICD-10-CM | POA: Diagnosis not present

## 2019-01-04 DIAGNOSIS — Z20828 Contact with and (suspected) exposure to other viral communicable diseases: Secondary | ICD-10-CM | POA: Diagnosis present

## 2019-01-04 DIAGNOSIS — N390 Urinary tract infection, site not specified: Secondary | ICD-10-CM | POA: Diagnosis present

## 2019-01-04 DIAGNOSIS — Z880 Allergy status to penicillin: Secondary | ICD-10-CM | POA: Diagnosis not present

## 2019-01-04 DIAGNOSIS — G9340 Encephalopathy, unspecified: Secondary | ICD-10-CM | POA: Diagnosis not present

## 2019-01-04 DIAGNOSIS — E861 Hypovolemia: Secondary | ICD-10-CM

## 2019-01-04 DIAGNOSIS — Y929 Unspecified place or not applicable: Secondary | ICD-10-CM | POA: Diagnosis not present

## 2019-01-04 DIAGNOSIS — K219 Gastro-esophageal reflux disease without esophagitis: Secondary | ICD-10-CM | POA: Diagnosis present

## 2019-01-04 DIAGNOSIS — Z91040 Latex allergy status: Secondary | ICD-10-CM | POA: Diagnosis not present

## 2019-01-04 DIAGNOSIS — G92 Toxic encephalopathy: Secondary | ICD-10-CM | POA: Diagnosis present

## 2019-01-04 DIAGNOSIS — S81802A Unspecified open wound, left lower leg, initial encounter: Secondary | ICD-10-CM | POA: Diagnosis not present

## 2019-01-04 DIAGNOSIS — Z79890 Hormone replacement therapy (postmenopausal): Secondary | ICD-10-CM | POA: Diagnosis not present

## 2019-01-04 DIAGNOSIS — E039 Hypothyroidism, unspecified: Secondary | ICD-10-CM | POA: Diagnosis present

## 2019-01-04 DIAGNOSIS — L97809 Non-pressure chronic ulcer of other part of unspecified lower leg with unspecified severity: Secondary | ICD-10-CM | POA: Diagnosis present

## 2019-01-04 DIAGNOSIS — Z66 Do not resuscitate: Secondary | ICD-10-CM | POA: Diagnosis present

## 2019-01-04 DIAGNOSIS — K5900 Constipation, unspecified: Secondary | ICD-10-CM

## 2019-01-04 DIAGNOSIS — G309 Alzheimer's disease, unspecified: Secondary | ICD-10-CM | POA: Diagnosis not present

## 2019-01-04 DIAGNOSIS — Z86711 Personal history of pulmonary embolism: Secondary | ICD-10-CM | POA: Diagnosis not present

## 2019-01-04 DIAGNOSIS — T50915A Adverse effect of multiple unspecified drugs, medicaments and biological substances, initial encounter: Secondary | ICD-10-CM | POA: Diagnosis present

## 2019-01-04 DIAGNOSIS — I130 Hypertensive heart and chronic kidney disease with heart failure and stage 1 through stage 4 chronic kidney disease, or unspecified chronic kidney disease: Secondary | ICD-10-CM | POA: Diagnosis present

## 2019-01-04 DIAGNOSIS — E785 Hyperlipidemia, unspecified: Secondary | ICD-10-CM | POA: Diagnosis present

## 2019-01-04 DIAGNOSIS — N179 Acute kidney failure, unspecified: Secondary | ICD-10-CM | POA: Diagnosis present

## 2019-01-04 DIAGNOSIS — Z86718 Personal history of other venous thrombosis and embolism: Secondary | ICD-10-CM | POA: Diagnosis not present

## 2019-01-04 DIAGNOSIS — Z888 Allergy status to other drugs, medicaments and biological substances status: Secondary | ICD-10-CM | POA: Diagnosis not present

## 2019-01-04 DIAGNOSIS — E860 Dehydration: Secondary | ICD-10-CM | POA: Diagnosis present

## 2019-01-04 LAB — COMPREHENSIVE METABOLIC PANEL
ALT: 17 U/L (ref 0–44)
AST: 63 U/L — ABNORMAL HIGH (ref 15–41)
Albumin: 3 g/dL — ABNORMAL LOW (ref 3.5–5.0)
Alkaline Phosphatase: 62 U/L (ref 38–126)
Anion gap: 13 (ref 5–15)
BUN: 24 mg/dL — ABNORMAL HIGH (ref 8–23)
CO2: 25 mmol/L (ref 22–32)
Calcium: 8.4 mg/dL — ABNORMAL LOW (ref 8.9–10.3)
Chloride: 105 mmol/L (ref 98–111)
Creatinine, Ser: 1.31 mg/dL — ABNORMAL HIGH (ref 0.44–1.00)
GFR calc Af Amer: 42 mL/min — ABNORMAL LOW (ref >60)
GFR calc non Af Amer: 36 mL/min — ABNORMAL LOW (ref >60)
Glucose, Bld: 63 mg/dL — ABNORMAL LOW (ref 70–99)
Potassium: 3.5 mmol/L (ref 3.5–5.1)
Sodium: 143 mmol/L (ref 135–145)
Total Bilirubin: 0.5 mg/dL (ref 0.3–1.2)
Total Protein: 5.6 g/dL — ABNORMAL LOW (ref 6.5–8.1)

## 2019-01-04 LAB — CBC
HCT: 29.6 % — ABNORMAL LOW (ref 36.0–46.0)
Hemoglobin: 9.5 g/dL — ABNORMAL LOW (ref 12.0–15.0)
MCH: 28.3 pg (ref 26.0–34.0)
MCHC: 32.1 g/dL (ref 30.0–36.0)
MCV: 88.1 fL (ref 80.0–100.0)
Platelets: 161 10*3/uL (ref 150–400)
RBC: 3.36 MIL/uL — ABNORMAL LOW (ref 3.87–5.11)
RDW: 19.5 % — ABNORMAL HIGH (ref 11.5–15.5)
WBC: 5.7 10*3/uL (ref 4.0–10.5)
nRBC: 0 % (ref 0.0–0.2)

## 2019-01-04 LAB — URINE CULTURE: Culture: <10000 — AB

## 2019-01-04 LAB — MAGNESIUM: Magnesium: 2.3 mg/dL (ref 1.7–2.4)

## 2019-01-04 LAB — SARS CORONAVIRUS 2 (TAT 6-24 HRS): SARS Coronavirus 2: NEGATIVE

## 2019-01-04 LAB — PHOSPHORUS: Phosphorus: 3.6 mg/dL (ref 2.5–4.6)

## 2019-01-04 MED ORDER — SODIUM CHLORIDE 0.9 % IV SOLN
INTRAVENOUS | Status: DC | PRN
  Administered 2019-01-04: 250 mL via INTRAVENOUS

## 2019-01-04 MED ORDER — ERYTHROMYCIN 5 MG/GM OP OINT
TOPICAL_OINTMENT | Freq: Four times a day (QID) | OPHTHALMIC | Status: DC
  Administered 2019-01-04 – 2019-01-06 (×9): 1 via OPHTHALMIC
  Administered 2019-01-07 (×2): via OPHTHALMIC
  Administered 2019-01-07 – 2019-01-09 (×6): 1 via OPHTHALMIC
  Filled 2019-01-04 (×6): qty 1

## 2019-01-04 MED ORDER — BISACODYL 10 MG RE SUPP
10.0000 mg | Freq: Once | RECTAL | Status: AC
  Administered 2019-01-04: 10 mg via RECTAL
  Filled 2019-01-04: qty 1

## 2019-01-04 NOTE — Consult Note (Signed)
[place] Nurse Consult Note: Reason for Consult: LE wounds from SNF In for hypotension. Patient not able to give me history or if she is followed by MD for her LEs  Wound type:venous stasis Pressure Injury POA:NA Measurement: No wounds on the RLE LLE medial 5cm x 4cm x 0.1cm: 100% clean, pink, and moist LLE pretibial: scattered ulcerations, all are clean and pink Would not consider this to be a source of infection. She has skin changes on the bilateral LE consistent with venous stasis disease.  Underlying cellulitis in the LLE Wound bed:see above  Drainage (amount, consistency, odor) minimal, non purulent  Periwound: erythema, palpable distal pulses; superficial venous stasis dermatitis on the RLE  Dressing procedure/placement/frequency: Continue xeroform to the open areas of the LLE, may could benefit from compression but would need ABI to determine safety and the SNF would need to be able to change and provide topical wound care.  If patient to be admitted and ABIs are obtained re-consult if needed for compression     Discussed POC with patient and bedside nurse.  Re consult if needed, will not follow at this time. Thanks  Elyza Whitt [employer], RN,CWOCN, CNS, CWON-AP [phone]) :

## 2019-01-04 NOTE — ED Notes (Signed)
Pt given meal tray.

## 2019-01-04 NOTE — ED Notes (Signed)
Pt given meal tray , but states that she does not wish to eat anything at this time

## 2019-01-04 NOTE — ED Notes (Signed)
[facility] updated regarding pt admission status

## 2019-01-04 NOTE — Progress Notes (Signed)
[facility]  - [place] at [facility]   PATIENT NAME: Megan Donaldson    MR#:  [id number]  DATE OF BIRTH:  [birth year]  SUBJECTIVE:  the ER very confused. Will to tell me any history review of systems. No family in the emergency room. Was alert earlier and did eat and drink per RN. During my evaluation talking out of head.  REVIEW OF SYSTEMS:   Review of Systems  Unable to perform ROS: Mental status change   Tolerating Diet: Tolerating PT:   DRUG ALLERGIES:   Allergies  Allergen Reactions  . Amoxicillin     Has patient had a PCN reaction causing immediate rash, facial/tongue/throat swelling, SOB or lightheadedness with hypotension: Unknown Has patient had a PCN reaction causing severe rash involving mucus membranes or skin necrosis: Unknown Has patient had a PCN reaction that required hospitalization: Unknown Has patient had a PCN reaction occurring within the last 10 years: Unknown If all of the above answers are "NO", then may proceed with Cephalosporin use.   . Latex   . Lisinopril   . Mometasone Furoate     VITALS:  Blood pressure 118/67, pulse 71, temperature (!) 93 F (33.9 C), temperature source Rectal, resp. rate 11, height 5\' 4"  (1.626 m), weight 74.8 kg, SpO2 95 %.  PHYSICAL EXAMINATION:   Physical Exam admitted exam secondary to confusion GENERAL:  85 y.o.-year-old patient lying in the bed with no acute distress.  EYES: Pupils equal, round, reactive to light and accommodation. No scleral icterus. HEENT: Head atraumatic, normocephalic. Oropharynx and nasopharynx clear. I oral mucosa NECK:  Supple, no jugular venous distention. No thyroid enlargement, no tenderness.  LUNGS: Normal breath sounds bilaterally, no wheezing, rales, rhonchi. No use of accessory muscles of respiration.  CARDIOVASCULAR: S1, S2 normal. No murmurs, rubs, or gallops.  ABDOMEN: Soft, nontender, nondistended. Bowel sounds present. No organomegaly or mass.  EXTREMITIES: No  cyanosis, clubbing or edema b/l.    NEUROLOGIC: limited exam moves all extremities well. Confused. PSYCHIATRIC:  patient is alert but very confused SKIN: RN documentation LABORATORY PANEL:  CBC Recent Labs  Lab 01/04/19 0613  WBC 5.7  HGB 9.5*  HCT 29.6*  PLT 161    Chemistries  Recent Labs  Lab 01/04/19 0613  NA 143  K 3.5  CL 105  CO2 25  GLUCOSE 63*  BUN 24*  CREATININE 1.31*  CALCIUM 8.4*  MG 2.3  AST 63*  ALT 17  ALKPHOS 62  BILITOT 0.5   Cardiac Enzymes No results for input(s): TROPONINI in the last 168 hours. RADIOLOGY:  Dg Abd 1 View  Result Date: 01/03/2019 CLINICAL DATA:  Constipation EXAM: ABDOMEN - 1 VIEW COMPARISON:  CT abdomen/pelvis 04/23/2010 FINDINGS: No dilated loops of bowel are demonstrated to suggest bowel obstruction. Rounded soft tissue prominence projects over the sacrum which may reflect a distended urinary bladder or large amount of stool within the rectum. 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 Thoracolumbar levoscoliosis. Bilateral hip prostheses. No evidence of acute bony abnormality. Imaged lung apices are clear.  Overlying monitoring leads. IMPRESSION: No radiographic evidence of bowel obstruction. Rounded soft tissue prominence projecting over the sacrum, which may reflect a distended urinary bladder or large amount of stool within the rectum. 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 Electronically Signed   By: 06/23/2010 DO  On: 01/03/2019 21:15   Dg Chest Portable 1 View  Result Date: 01/03/2019 CLINICAL DATA:  Hypotension and weakness. EXAM: PORTABLE CHEST 1 VIEW COMPARISON:  09/18/2017 FINDINGS: The patient is rotated to the right on today's radiograph, reducing diagnostic sensitivity and  specificity. Atherosclerotic calcification of the aortic arch. The lungs appear clear. No blunting of the costophrenic angles. Prominent asymmetric degenerative right glenohumeral arthropathy. Rim calcified splenic artery aneurysm as shown on remote cross-sectional imaging. IMPRESSION: 1. No active cardiopulmonary disease is radiographically apparent. 2. Atherosclerotic calcification of the aortic arch. 3. Prominent degenerative right glenohumeral arthropathy. 4. Chronic splenic artery aneurysm. Electronically Signed   By: Van Clines M.D.   On: 01/03/2019 19:06   ASSESSMENT AND PLAN:  Megan Donaldson is a 85 y.o. female with medical history significant of dementia possibly vascular, CHF, HTN, HLD, GERD, osteoporosis history of PE/DVT. Presented with   hypotension blood pressure initially 73/43 at the SNF.  Patient was given a bolus by EMS blood pressure up to 96/51 satting 97% on room air heart rate 56 respirations 12, noted to have temperature down to 93.7 axillary was found to have a wound on her left shin.  *Sepsis without evidence of end organ damage. -Patient came in with blood pressure in the 70s and hypothermia with mild tachycardia which has improved during the ER course -currently on bear hugger -Patient has some encephalopathy elevated lactic acid which is resolved -appears to be urinary and left tibial shin ulcer -currently on IV Rocephin -follow blood culture urine culture -COVID-19 negative  *Left tibial shin ulcer present on admission -LLE medial 5cm x 4cm x 0.1cm: 100% clean, pink, and moist LLE pretibial: scattered ulcerations, all are clean and pink  *Acute metabolic encephalopathy multifactorial due to dehydration, polypharmacy, sepsis -hold contributing medication causing more sedation -neuro- exam appears nonfocal however patient is confused. -Continue IV hydration  *Hyperlipidemia continue home meds  *Hypothyroidism continue Synthroid  *History of PE no longer  on anticoagulation  *Gerd/peptic ulcer disease continue Protonix    Discharge Disposition TBD CODE STATUS: DNR DVT Prophylaxis: lovenox  TOTAL TIME TAKING CARE OF THIS PATIENT: *30* minutes.  >50% time spent on counselling and coordination of care  POSSIBLE D/C IN *2-3* DAYS, DEPENDING ON CLINICAL CONDITION.  Note: This dictation was prepared with Dragon dictation along with smaller phrase technology. Any transcriptional errors that result from this process are unintentional.  Fritzi Mandes M.D on 01/04/2019 at 5:08 PM  Between 7am to 6pm - Pager - [phone]  After 6pm go to [email or web address]  [facility]   CC: Primary care physician; [facility], Doctors MakingPatient ID: Megan Donaldson, female   DOB: May 20, 1927, 85 y.o.   MRN: [id number]

## 2019-01-04 NOTE — ED Notes (Signed)
RN offered pt food. PT requesting to sleep at this time but aware food is available when hungry.

## 2019-01-04 NOTE — Progress Notes (Signed)
PT Cancellation Note  Patient Details Name: Megan Donaldson MRN: [id number] DOB: [birth year]   Cancelled Treatment:    Reason Eval/Treat Not Completed: Medical issues which prohibited therapy(Consult received and chart reviewed.  Patient noted to be hypothermic with recent placement of bair hugger for support/recovery.  Will hold at this time to ensure medical stability and readiness for participation with exertional activity.  Will continue to follow and initiate as medically appropriate.)   Gayland Nicol H. Owens Shark, PT, DPT, NCS 01/04/19, 2:03 PM [phone]

## 2019-01-04 NOTE — ED Notes (Signed)
Pt being transported at this time, warming blanket disconnected but left on pt to be used by floor in needed

## 2019-01-04 NOTE — ED Notes (Addendum)
Earlier this morning pt able to take medications with water through straw, no difficulty drinking or swallowing.  Pt talking out loud in room and confused. Pt mouth appeared dry so RN attempted to get pt to drink eater with straw as she did earlier today but was unable to drink from straw at this time. Pt states she cant move her legs but pt was actually moving legs under blankets. Admitting physician aware of pt current status.

## 2019-01-04 NOTE — ED Notes (Signed)
Pt daughter updated at this time.

## 2019-01-04 NOTE — Progress Notes (Signed)
OT Cancellation Note  Patient Details Name: Megan Donaldson MRN: [id number] DOB: 08/31/1927   Cancelled Treatment:    Reason Eval/Treat Not Completed: Medical issues which prohibited therapy  Order received and chart reviewed.  Patient noted to be hypothermic with recent placement of bair hugger for support/recovery.  Will hold at this time to ensure medical stability and readiness for participation with occupational therapy.  Will f/u and initiate OT evaluation when pt becomes more appropriate.   Gerrianne Scale, MS, OTR/L ascom [phone] or [phone] 01/04/19, 4:16 PM

## 2019-01-04 NOTE — ED Notes (Signed)
Bair hugger placed on pt at this time.

## 2019-01-04 NOTE — ED Notes (Signed)
Bladder scan= 143ml  Pt able to urinate with purcath in place

## 2019-01-05 DIAGNOSIS — F0280 Dementia in other diseases classified elsewhere without behavioral disturbance: Secondary | ICD-10-CM

## 2019-01-05 DIAGNOSIS — G309 Alzheimer's disease, unspecified: Secondary | ICD-10-CM

## 2019-01-05 DIAGNOSIS — E039 Hypothyroidism, unspecified: Secondary | ICD-10-CM

## 2019-01-05 DIAGNOSIS — G9340 Encephalopathy, unspecified: Secondary | ICD-10-CM

## 2019-01-05 LAB — T3: T3, Total: 75 ng/dL (ref 71–180)

## 2019-01-05 LAB — GLUCOSE, CAPILLARY
Glucose-Capillary: 68 mg/dL — ABNORMAL LOW (ref 70–99)
Glucose-Capillary: 103 mg/dL — ABNORMAL HIGH (ref 70–99)
Glucose-Capillary: 70 mg/dL (ref 70–99)

## 2019-01-05 LAB — T4: T4, Total: 8.6 ug/dL (ref 4.5–12.0)

## 2019-01-05 MED ORDER — ENOXAPARIN SODIUM 40 MG/0.4ML ~~LOC~~ SOLN
40.0000 mg | SUBCUTANEOUS | Status: DC
  Administered 2019-01-05 – 2019-01-08 (×4): 40 mg via SUBCUTANEOUS
  Filled 2019-01-05 (×4): qty 0.4

## 2019-01-05 MED ORDER — ADULT MULTIVITAMIN W/MINERALS CH
1.0000 | ORAL_TABLET | Freq: Every day | ORAL | Status: DC
  Administered 2019-01-06 – 2019-01-09 (×4): 1 via ORAL
  Filled 2019-01-05 (×4): qty 1

## 2019-01-05 MED ORDER — ENSURE ENLIVE PO LIQD
237.0000 mL | Freq: Two times a day (BID) | ORAL | Status: DC
  Administered 2019-01-05 – 2019-01-09 (×7): 237 mL via ORAL

## 2019-01-05 NOTE — Progress Notes (Signed)
Anticoagulation monitoring(Lovenox):  85yo  F ordered Lovenox 30 mg Q24h  Filed Weights   01/03/19 1738 01/05/19 1309  Weight: 165 lb (74.8 kg) 191 lb 2.2 oz (86.7 kg)   BMI 32   Lab Results  Component Value Date   CREATININE 1.31 (H) 01/04/2019   CREATININE 1.80 (H) 01/03/2019   CREATININE 1.16 (H) 11/15/2018   Estimated Creatinine Clearance: 31 mL/min (A) (by C-G formula based on SCr of 1.31 mg/dL (H)). Hemoglobin & Hematocrit     Component Value Date/Time   HGB 9.5 (L) 01/04/2019 0613   HGB 11.0 (L) 10/25/2011 1130   HCT 29.6 (L) 01/04/2019 0613   HCT 34.1 (L) 10/25/2011 1130     Per Protocol for Patient with estCrcl > 30 ml/min and BMI < 40, will transition to Lovenox 40 mg Q24h.     Chinita Greenland PharmD Clinical Pharmacist 01/05/2019

## 2019-01-05 NOTE — Progress Notes (Signed)
[facility] [place] NAME: Megan Donaldson    MR#:  [id number]  DATE OF BIRTH:  13-Dec-1927  SUBJECTIVE:   Patient very lethargic and sleepy. Unable to keep her eyes open. Has been drinking on and off some fruit juice.  Unable to have meaningful conversation.  no family in the room REVIEW OF SYSTEMS:   Review of Systems  Unable to perform ROS: Mental status change   Tolerating Diet:not  much Tolerating PT:   DRUG ALLERGIES:   Allergies  Allergen Reactions  . Amoxicillin     Has patient had a PCN reaction causing immediate rash, facial/tongue/throat swelling, SOB or lightheadedness with hypotension: Unknown Has patient had a PCN reaction causing severe rash involving mucus membranes or skin necrosis: Unknown Has patient had a PCN reaction that required hospitalization: Unknown Has patient had a PCN reaction occurring within the last 10 years: Unknown If all of the above answers are "NO", then may proceed with Cephalosporin use.   . Latex   . Lisinopril   . Mometasone Furoate     VITALS:  Blood pressure 125/64, pulse (!) 59, temperature (!) 97.4 F (36.3 C), temperature source Oral, resp. rate 14, height 5\' 4"  (1.626 m), weight 86.7 kg, SpO2 100 %.  PHYSICAL EXAMINATION:   Physical Exam limeted  GENERAL:  85 y.o.-year-old patient lying in the bed with no acute distress. Weak, deconditioned EYES: Pupils equal, round, reactive to light and accommodation. No scleral icterus.  HEENT: Head atraumatic, normocephalic. Oropharynx and nasopharynx clear.  LUNGS: Normal breath sounds bilaterally, no wheezing, rales, rhonchi. No use of accessory muscles of respiration.  CARDIOVASCULAR: S1, S2 normal. No murmurs, rubs, or gallops.  ABDOMEN: Soft, nontender, nondistended. Bowel sounds present. No organomegaly or mass.  EXTREMITIES: No cyanosis, clubbing o ++edema b/l.    NEUROLOGIC: moves extremities spontaneously. Patient is lethargic. Opens  eyes to verbal commands does not communicate. PSYCHIATRIC:  patient is lethargic SKIN: small tibial shin left superficial ulcer    LABORATORY PANEL:  CBC Recent Labs  Lab 01/04/19 0613  WBC 5.7  HGB 9.5*  HCT 29.6*  PLT 161    Chemistries  Recent Labs  Lab 01/04/19 0613  NA 143  K 3.5  CL 105  CO2 25  GLUCOSE 63*  BUN 24*  CREATININE 1.31*  CALCIUM 8.4*  MG 2.3  AST 63*  ALT 17  ALKPHOS 62  BILITOT 0.5   Cardiac Enzymes No results for input(s): TROPONINI in the last 168 hours. RADIOLOGY:  Dg Abd 1 View  Result Date: 01/03/2019 CLINICAL DATA:  Constipation EXAM: ABDOMEN - 1 VIEW COMPARISON:  CT abdomen/pelvis 04/23/2010 FINDINGS: No dilated loops of bowel are demonstrated to suggest bowel obstruction. Rounded soft tissue prominence projects over the sacrum which may reflect a distended urinary bladder or large amount of stool within the rectum. 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 Thoracolumbar levoscoliosis. Bilateral hip prostheses. No evidence of acute bony abnormality. Imaged lung apices are clear.  Overlying monitoring leads. IMPRESSION: No radiographic evidence of bowel obstruction. Rounded soft tissue prominence projecting over the sacrum, which may reflect a distended urinary bladder or large amount of stool within the rectum. 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 Electronically Signed   By: Kellie Simmering DO   On: 01/03/2019 21:15  Dg Chest Portable 1 View  Result Date: 01/03/2019 CLINICAL DATA:  Hypotension and weakness. EXAM: PORTABLE CHEST 1 VIEW COMPARISON:  09/18/2017 FINDINGS: The patient is rotated to the right on today's radiograph, reducing diagnostic sensitivity and specificity. Atherosclerotic calcification of  the aortic arch. The lungs appear clear. No blunting of the costophrenic angles. Prominent asymmetric degenerative right glenohumeral arthropathy. Rim calcified splenic artery aneurysm as shown on remote cross-sectional imaging. IMPRESSION: 1. No active cardiopulmonary disease is radiographically apparent. 2. Atherosclerotic calcification of the aortic arch. 3. Prominent degenerative right glenohumeral arthropathy. 4. Chronic splenic artery aneurysm. Electronically Signed   By: Gaylyn Rong M.D.   On: 01/03/2019 19:06   ASSESSMENT AND PLAN:  Megan Donaldson a 85 y.o.femalewith medical history significant ofdementia possibly vascular,CHF, HTN, HLD, GERD, osteoporosis history of PE/DVT. Presented withhypotension blood pressure initially 73/43 at the SNF. Patient was given a bolus by EMS blood pressure up to 96/51 satting 97% on room air heart rate 56 respirations 12,noted to have temperature down to 93.7 axillary was found to have a wound on her left shin.  *Sepsis without evidence of end organ damage. -Sepsis ruled out -low blood pressure was likely due to poor PO intake -Patient came in with blood pressure in the 70s and hypothermia with mild tachycardia which has improved during the ER course -Patient has some encephalopathy elevated lactic acid which is resolved -appears to be urinary and left tibial shin ulcer-- superficial. No findings of cellulitis-- continue dressing changes per wound nurse recommendations -currently on IV Rocephin-- discontinued -follow blood and culture urine culture negative -COVID-19 negative  *Left tibial shin ulcer present on admission -LLE medial 5cm x 4cm x 0.1cm: 100% clean, pink, and moist LLE pretibial: scattered ulcerations, all are clean and pink -does not look infected. DC antibiotics  *Acute metabolic encephalopathy multifactorial due to dehydration, polypharmacy,  -hold contributing medication causing more sedation -neuro- exam appears  nonfocal however patient is confused. -Continue IV hydration  *Hyperlipidemia continue home meds  *Hypothyroidism continue Synthroid  *History of PE no longer on anticoagulation  *Gerd/peptic ulcer disease continue Protonix  CODE STATUS: DNR DVT Prophylaxis: lovenox    Family communication : spoke with daughter [name] on the phone at length. She understands mothers dementia is worsened and has a poor prognosis. She is in agreement with palliative care. If patient continues to do poorly she understands hospice would be an option. Discharge Disposition : back to [facility]    TOTAL TIME TAKING CARE OF THIS PATIENT: **35* minutes.  >50% time spent on counselling and coordination of care  POSSIBLE D/C IN *1 to 2** DAYS, DEPENDING ON CLINICAL CONDITION.  Note: This dictation was prepared with Dragon dictation along with smaller phrase technology. Any transcriptional errors that result from this process are unintentional.  Enedina Finner M.D on 01/05/2019 at 2:59 PM  Between 7am to 6pm - Pager - [phone]  After 6pm go to [email or web address]  [facility]   CC: Primary care physician; [facility], Doctors MakingPatient ID: Megan Donaldson, female   DOB: [birth year], 85 y.o.   MRN: [id number]

## 2019-01-05 NOTE — Progress Notes (Signed)
One cup of juice provided for blood glucose of 68

## 2019-01-05 NOTE — Progress Notes (Signed)
MD notified: This patient is not diabetic but I noticed that her blood sugar was 63 yesterday morning. I asked the NA to check her blood glucose to make sure she was doing was ok this morning and her blood sugar is 68. She also has order for Q2 vitals signs are you ok with changing the frequency of that to routine?

## 2019-01-05 NOTE — Progress Notes (Signed)
OT Cancellation Note  Patient Details Name: Megan Donaldson MRN: [id number] DOB: 1927-08-01   Cancelled Treatment:    Reason Eval/Treat Not Completed: Fatigue/lethargy limiting ability to participate. Consult received, chart reviewed. Upon attempt, pt sleeping soundly, difficult to wake to gentle verbal and tactile cues. Mitts on. Will hold OT evaluation at this time and re-attempt at later date/time as appropriate.   Jeni Salles, MPH, MS, OTR/L ascom [phone] 01/05/19, 10:21 AM

## 2019-01-05 NOTE — Progress Notes (Signed)
MD notified: Telemetry has called me the patient's heart rate has been trending down from being in the 70's to now being in the low 50ths. Current heart rate 50bpm.

## 2019-01-05 NOTE — Evaluation (Addendum)
Clinical/Bedside Swallow Evaluation Patient Details  Name: Megan Donaldson MRN: [id number] Date of Birth: [birth year]  Today's Date: 01/05/2019 Time: SLP Start Time (ACUTE ONLY): 0840 SLP Stop Time (ACUTE ONLY): 0935 SLP Time Calculation (min) (ACUTE ONLY): 55 min  Past Medical History:  Past Medical History:  Diagnosis Date  . CHF (congestive heart failure) ([place])   . Dementia ([place])   . GERD (gastroesophageal reflux disease)   . Hyperlipidemia   . Hypertension   . Osteoporosis   . Personal history of other venous thrombosis and embolism   . Personal history of PE (pulmonary embolism)    Past Surgical History:  Past Surgical History:  Procedure Laterality Date  . ESOPHAGOGASTRODUODENOSCOPY N/A 02/02/2018   Procedure: ESOPHAGOGASTRODUODENOSCOPY (EGD);  Surgeon: Toledo, Benay Pike, MD;  Location: [facility] ENDOSCOPY;  Service: Gastroenterology;  Laterality: N/A;  . HIP CLOSED REDUCTION Right 12/21/2014   Procedure: CLOSED REDUCTION HIP;  Surgeon: Earnestine Leys, MD;  Location: [facility] ORS;  Service: Orthopedics;  Laterality: Right;  . HIP CLOSED REDUCTION Right 09/01/2018   Procedure: CLOSED REDUCTION HIP;  Surgeon: Dereck Leep, MD;  Location: [facility] ORS;  Service: Orthopedics;  Laterality: Right;  . TOTAL HIP ARTHROPLASTY Right    x3   HPI:  Pt is a 85 y.o. female with medical history significant of advanced Dementia possibly vascular, CHF, HTN, HLD, GERD, osteoporosis history of PE/DVT, bleeding ulcer. She has hx of hypotension and hypothermia; recent fall w/ fx of hand. Pt resides at [facility].  She was admitted w/ Acute metabolic encephalopathy; hypothermia, sepsis criteria. Per current CXR: "No active cardiopulmonary disease is radiographically apparent; lungs clear".   Assessment / Plan / Recommendation Clinical Impression  Pt appears to present w/ adequate oropharyngeal phase swallowing function w/ trials of thin liquids; pt refused trials of soft solids/foods even when given Mod+  encouragement and choices. This was a limited exam. Pt exhibited Mod-Severe confusion/Cognitive decline - pt has a baseline of advanced Dementia. Pt's Cognitive status currently appears to significantly impact her attention and desire for oral intake despite encouragement.  W/ encouragement and Mitts in place, pt accepted and helped to hold the Cup for drinking of a few sips/trials of thin liquids (water) via Straw. No overt s/s of aspiration noted during/post trials; no coughing or wet vocal quality noted during trials. Oral phase management of the liquids trials was appropriate w/ no anterior loss. A-P transfer was timely and oral clearing followed the sips via Straw. No unilateral weakness noted at rest or during bolus management and speech; speech clear w/ few words spoken. Full feeding support given - pt made min attempt to hold the cup w/ SLP w/ mitts in place.  Recommend a more cut meats/soft foods diet -- Mech Soft w/ thin liquids; general aspiration precautions; Reflux precautions. Feeding Support at meals - reduce distractions. Pills given in Puree - Crushed as needed.  MD/NSG to reconsult ST services if any decline in swallowing status while admitted.  SLP Visit Diagnosis: Dysphagia, unspecified (R13.10)    Aspiration Risk  Mild aspiration risk;Risk for inadequate nutrition/hydration(d/t advanced Dementia; reduced w/ precautions)    Diet Recommendation  Dysphagia level 3 (mech soft but w/ added purees, minced meats/foods as needed) w/ thin liquids - modified diet d/t declined Cognitive status/Dementia. Feeding assistance at meals; general aspiration and REFLUX precautions  Medication Administration: Whole meds with puree(versus Crushed as needed d/t Cognitive decline)    Other  Recommendations Recommended Consults: (Dietician f/u; Palliative Care consult for GOC/Ed) Oral Care  Recommendations: Oral care BID;Staff/trained caregiver to provide oral care Other Recommendations: (n/a)   Follow up  Recommendations None      Frequency and Duration (n/a)  (n/a)       Prognosis Prognosis for Safe Diet Advancement: Fair Barriers to Reach Goals: Cognitive deficits;Time post onset;Severity of deficits      Swallow Study   General Date of Onset: 01/03/19 HPI: Pt is a 85 y.o. female with medical history significant of advanced Dementia possibly vascular, CHF, HTN, HLD, GERD, osteoporosis history of PE/DVT, bleeding ulcer. She has hx of hypotension and hypothermia; recent fall w/ fx of hand. Pt resides at [facility].  She was admitted w/ Acute metabolic encephalopathy; hypothermia, sepsis criteria.  Type of Study: Bedside Swallow Evaluation Previous Swallow Assessment: none Diet Prior to this Study: Regular;Thin liquids(currently ordered) Temperature Spikes Noted: No(wbc 5.7) Respiratory Status: Room air History of Recent Intubation: No Behavior/Cognition: Alert;Confused;Agitated;Distractible;Requires cueing;Doesn't follow directions(few words) Oral Cavity Assessment: Dry Oral Care Completed by SLP: Yes Oral Cavity - Dentition: Adequate natural dentition;Missing dentition(few) Vision: (n/a) Self-Feeding Abilities: Total assist Patient Positioning: Upright in bed(needed positioning) Baseline Vocal Quality: Normal(w/ few words spoken) Volitional Cough: Cognitively unable to elicit Volitional Swallow: Unable to elicit    Oral/Motor/Sensory Function Overall Oral Motor/Sensory Function: Within functional limits(appeared so w/ no unilateral weakness noted)   Ice Chips Ice chips: Not tested   Thin Liquid Thin Liquid: Within functional limits Presentation: Straw(supported cup; 6 swallows/trials) Other Comments: refused further    Nectar Thick Nectar Thick Liquid: Not tested   Honey Thick Honey Thick Liquid: Not tested   Puree Puree: Not tested Other Comments: pt refused but swallowed applesauce adequately w/ NSG earlier this AM   Solid     Solid: Not tested Other Comments: pt  refused        Jerilynn Som, MS, CCC-SLP Watson,Katherine 01/05/2019,2:45 PM

## 2019-01-05 NOTE — Progress Notes (Signed)
MD notified: As the day progresses the patient is more lethargic and drowsy. Patient is not able to keep her evers open and opens eyes only to physical stimulation. vitals are stable at this time BP 125/64, temp 97.4, HR 59, oxygen 100 on room air.

## 2019-01-05 NOTE — Progress Notes (Signed)
PT Cancellation Note  Patient Details Name: Megan Donaldson MRN: [id number] DOB: Nov 07, 1927   Cancelled Treatment:    Reason Eval/Treat Not Completed: Fatigue/lethargy limiting ability to participate(Evaluation re-attempted.  Patient sleeping soundly; unable to maintain arousal for adequate participation with session at this time.  Will continue efforts at later time/date as medically appropriate and available.)   Reed Eifert H. Owens Shark, PT, DPT, NCS 01/05/19, 10:28 AM [phone]

## 2019-01-05 NOTE — Progress Notes (Addendum)
MD notified: Megan Donaldson, the patient is alert x2 right now and awake now. Daughter is at the bedside and she is talking with her. Her drowsiness could have been related to the medications this after noon.

## 2019-01-05 NOTE — Progress Notes (Addendum)
Initial Nutrition Assessment  DOCUMENTATION CODES:   Obesity unspecified  INTERVENTION:   Ensure Enlive po BID, each supplement provides 350 kcal and 20 grams of protein  MVI daily   Magic cup TID with meals, each supplement provides 290 kcal and 9 grams of protein  NUTRITION DIAGNOSIS:   Inadequate oral intake related to acute illness as evidenced by meal completion < 25%.  GOAL:   Patient will meet greater than or equal to 90% of their needs  MONITOR:   PO intake, Supplement acceptance, Labs, Weight trends, Skin, I & O's  REASON FOR ASSESSMENT:   Consult Assessment of nutrition requirement/status  ASSESSMENT:   85 y.o. female with medical history significant of dementia possibly vascular, CHF, HTN, HLD, GERD, osteoporosis history of PE/DVT and bleeding ulcer admitted with AMS secondary to urinary sepsis   Unable to speak with patient r/t dementia. Pt lying in bed asleep at time or RD visit. Lunch tray was untouched on the side table along with an Ensure that was still full. Per RN documentation, pt refusing meals. Pt with poor dentition; placed on dysphagia 3 diet. RD will add supplements to help pt meet her estimated needs. Per chart, pt appears weight stable pta; RD suspects fair appetite and oral intake at baseline.   Medications reviewed and include: aspirin, lovenox, synthroid, protonix, senokot, ceftriaxone   Labs reviewed: K 3.5 wnl, P 3.6 wnl, Mg 2.3 wnl, BUN 24(H), creat 1.31(H) Hgb 9.5(L), Hct 29.6(L)  NUTRITION - FOCUSED PHYSICAL EXAM:    Most Recent Value  Orbital Region  No depletion  Upper Arm Region  No depletion  Thoracic and Lumbar Region  No depletion  Buccal Region  No depletion  Temple Region  No depletion  Clavicle Bone Region  No depletion  Clavicle and Acromion Bone Region  No depletion  Scapular Bone Region  No depletion  Dorsal Hand  No depletion  Patellar Region  No depletion  Anterior Thigh Region  No depletion  Posterior Calf  Region  No depletion  Edema (RD Assessment)  Moderate  Hair  Reviewed  Eyes  Reviewed  Mouth  Reviewed  Skin  Reviewed  Nails  Reviewed     Diet Order:   Diet Order            DIET DYS 3 Room service appropriate? Yes with Assist; Fluid consistency: Thin  Diet effective now             EDUCATION NEEDS:   Not appropriate for education at this time  Skin:  Skin Assessment: Reviewed RN Assessment(pressure injury pretibial, MASD, BLE cellulitis)  Last BM:  pta  Height:   Ht Readings from Last 1 Encounters:  01/03/19 5\' 4"  (1.626 m)    Weight:   Wt Readings from Last 1 Encounters:  01/05/19 86.7 kg    Ideal Body Weight:  54.5 kg  BMI:  Body mass index is 32.81 kg/m.  Estimated Nutritional Needs:   Kcal:  1600-1800kcal/day  Protein:  80-90g/day  Fluid:  >1.4L/day  Koleen Distance MS, RD, LDN Pager #- [phone] Office#- [phone] After Hours Pager: [phone]

## 2019-01-05 NOTE — TOC Initial Note (Addendum)
Transition of Care [facility]) - Initial/Assessment Note    Patient Details  Name: Megan Donaldson MRN: [id number] Date of Birth: Feb 27, 1927  [facility] CM/SW Contact:    Beverly Sessions, RN Phone Number: 01/05/2019, 10:24 AM  Clinical Narrative:                 Per nursing patient alert to self RNCM attempted to call patient's daughter to complete assessment.  Voicemail left   RNCM spoke with Janett Billow [employer] developer) at [facility].  She confirms that patient is from the Assisted Living side. States they would have to come assess patient prior to them confirming patient can return.  She states they will need a covid test 24-48 hours prior to discharge  Patient open with [facility] home health for RN.  Malachy Mood with [facility] notified of admission  Update:  Spoke with daughter. She would like patient to return to [facility] if she is able.  States that patient recently had a splint removed from her arm, and since then has been able to walk with a walker to the dining hall at the facility.  Needs assistance with Bathing and toleiting.    Expected Discharge Plan: Assisted Living     Patient Goals and CMS Choice        Expected Discharge Plan and Services Expected Discharge Plan: Assisted Living       Living arrangements for the past 2 months: [place]: [place] Date [place]: 01/05/19 Time HH Agency Contacted: 1024 Representative spoke with at [place]: [place]  Prior Living Arrangements/Services Living arrangements for the past 2 months: [place] Lives with:: Facility Resident              Current home services: Home RN    Activities of Daily Living Home Assistive Devices/Equipment: Environmental consultant (specify type), Shower chair with back, Raised toilet seat with rails, Long-handled shoehorn, Eyeglasses ADL Screening (condition at time of  admission) Patient's cognitive ability adequate to safely complete daily activities?: No Is the patient deaf or have difficulty hearing?: Yes Does the patient have difficulty seeing, even when wearing glasses/contacts?: No Does the patient have difficulty concentrating, remembering, or making decisions?: Yes Patient able to express need for assistance with ADLs?: No Does the patient have difficulty dressing or bathing?: Yes Independently performs ADLs?: No Communication: Independent Dressing (OT): Needs assistance Is this a change from baseline?: Pre-admission baseline Grooming: Needs assistance Is this a change from baseline?: Pre-admission baseline Feeding: Independent Bathing: Needs assistance Is this a change from baseline?: Pre-admission baseline Toileting: Needs assistance Is this a change from baseline?: Pre-admission baseline In/Out Bed: Needs assistance Is this a change from baseline?: Pre-admission baseline Walks in Home: Needs assistance Is this a change from baseline?: Pre-admission baseline Does the patient have difficulty walking or climbing stairs?: Yes Weakness of Legs: Both Weakness of Arms/Hands: Both  Permission Sought/Granted                  Emotional Assessment       Orientation: : Oriented to Self   Psych Involvement: No (comment)  Admission diagnosis:  Lower urinary tract infectious disease [N39.0] Constipation [K59.00] Sepsis ([place]) [A41.9] Patient Active Problem List   Diagnosis Date Noted  . Conjunctivitis 01/04/2019  . Acute encephalopathy 01/04/2019  .  Acute metabolic encephalopathy 01/03/2019  . Sepsis (HCC) 01/03/2019  . Acute lower UTI 01/03/2019  . Hypotension 01/03/2019  . Multiple open wounds of lower leg 01/03/2019  . Prolonged QT interval 01/03/2019  . Bilateral lower leg cellulitis 11/10/2018  . Acute gastric ulcer with hemorrhage 03/10/2018  . GI bleed 02/01/2018  . Altered mental status 09/17/2017  . Seizure (HCC)  08/07/2017  . 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 Gastroesophageal reflux disease 10/22/2015  . Mild depression (HCC) 10/22/2015  . H/O: GI bleed 07/30/2015  . History of deep venous thrombosis 07/30/2015  . History of pulmonary embolism 07/30/2015  . 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 S/P closed reduction of dislocated total hip prosthesis 03/19/2015  . Essential hypertension 03/23/2014  . Hyperlipidemia 03/23/2014  . Hypothyroidism 03/23/2014   PCP:  Almetta Lovely, [facility] Pharmacy:   [facility] - [place], [place] - [address] [place] [address] Phone: [phone] Fax: [phone]  [facility] [phone] Dan Humphre[place], [place] - [address] [place] [address] Phone: [phone] Fax: [phone]     Social Determinants of Health (SDOH) Interventions    Readmission Risk Interventions Readmission Risk Prevention Plan 01/05/2019 11/13/2018  Medication Screening - Complete  Transportation Screening Complete Complete  HRI or Home Care Consult Complete -  Medication Review (RN Care Manager) Complete -  Some recent data might be hidden

## 2019-01-06 DIAGNOSIS — S81802A Unspecified open wound, left lower leg, initial encounter: Secondary | ICD-10-CM

## 2019-01-06 DIAGNOSIS — K5900 Constipation, unspecified: Secondary | ICD-10-CM

## 2019-01-06 DIAGNOSIS — F0390 Unspecified dementia without behavioral disturbance: Secondary | ICD-10-CM

## 2019-01-06 MED ORDER — POLYETHYLENE GLYCOL 3350 17 G PO PACK
17.0000 g | PACK | Freq: Once | ORAL | Status: AC
  Administered 2019-01-06: 17 g via ORAL
  Filled 2019-01-06: qty 1

## 2019-01-06 NOTE — TOC Progression Note (Signed)
Transition of Care [facility]) - Progression Note    Patient Details  Name: Megan Donaldson MRN: [id number] Date of Birth: August 08, 1927  Transition of Care [facility]) CM/SW Contact  Beverly Sessions, RN Phone Number: 01/06/2019, 3:37 PM  Clinical Narrative:    Message left for Janett Billow at [facility] to determine if anyone is available over the weekend to assess patients for return.  Awaiting return call   Once PT has assessed patient, TOC will need to complete Fl2   Expected Discharge Plan: Assisted Living    Expected Discharge Plan and Services Expected Discharge Plan: Assisted Living       Living arrangements for the past 2 months: [place]: [place] Date [place]: 01/05/19 Time HH Agency Contacted: 1024 Representative spoke with at [place]: [place]   Social Determinants of Health ([place]) Interventions    Readmission Risk Interventions Readmission Risk Prevention Plan 01/05/2019 11/13/2018  Medication Screening - Complete  Transportation Screening Complete Complete  HRI or Home Care Consult Complete -  Medication Review (RN Care Manager) Complete -  Some recent data might be hidden

## 2019-01-06 NOTE — Care Management Important Message (Signed)
Important Message  Patient Details  Name: Megan Donaldson MRN: [id number] Date of Birth: Mar 25, 1927   Medicare Important Message Given:  Yes  Initial Medicare IM given by Patient Access Associate on 01/05/2019 at 10:41am.  Still valid.     Dannette Barbara 01/06/2019, 8:25 AM

## 2019-01-06 NOTE — Evaluation (Signed)
Occupational Therapy Evaluation Patient Details Name: Megan Donaldson MRN: [id number] DOB: [birth year] Today's Date: 01/06/2019    History of Present Illness Pt is a 85 y.o. female with medical history significant of advanced Dementia possibly vascular, CHF, HTN, HLD, GERD, osteoporosis history of PE/DVT, bleeding ulcer. She has hx of hypotension and hypothermia; recent fall w/ fx of hand. Pt resides at [facility].  She was admitted w/ Acute metabolic encephalopathy; hypothermia, sepsis criteria. Per current CXR: "No active cardiopulmonary disease is radiographically apparent; lungs clear".   Clinical Impression   Pt seen for OT evaluation this date. Prior to hospital admission, pt was ambulating short distances in ALF with RW to/from dining hall and getting assist from staff for bathing, dressing, and toileting per chart/pt. No family present to confirm history provided by pt. Currently pt demonstrates impairments in strength, activity tolerance, cognition, and balance requiring set up assist for self feeding with supervision, and min-mod assist for seated bathing and dressing. Functional ADL mobility deferred 2/2 pt declining in order to eat breakfast. Will continue to assess. Pt would benefit from skilled OT to address noted impairments and functional limitations (see below for any additional details) in order to maximize safety and independence while minimizing falls risk and caregiver burden.  Upon hospital discharge, recommend pt discharge to SNF.    Follow Up Recommendations  SNF    Equipment Recommendations  3 in 1 bedside commode    Recommendations for Other Services       Precautions / Restrictions Precautions Precautions: Fall;Other (comment) Precaution Comments: aspiration Restrictions Weight Bearing Restrictions: No      Mobility Bed Mobility               General bed mobility comments: deferred, pt declined OOB for breakfast, will continue to assess  Transfers                  General transfer comment: deferred, pt declined OOB for breakfast, will continue to assess    Balance                                           ADL either performed or assessed with clinical judgement   ADL Overall ADL's : Needs assistance/impaired Eating/Feeding: Set up;Supervision/ safety Eating/Feeding Details (indicate cue type and reason): pt with improved alertness for feeding, with set up of small packets and items like straw, pt able to scoop appropriate sized bites of food onto spoon and self feed with minimal spillage noted Grooming: Minimal assistance   Upper Body Bathing: Sitting;Minimal assistance   Lower Body Bathing: Sitting/lateral leans;Moderate assistance;Maximal assistance   Upper Body Dressing : Sitting;Minimal assistance   Lower Body Dressing: Sitting/lateral leans;Moderate assistance;Maximal assistance                       Vision Baseline Vision/History: Wears glasses Wears Glasses: (unclear when she wears them, did not see in room) Patient Visual Report: No change from baseline       Perception     Praxis      Pertinent Vitals/Pain Pain Assessment: No/denies pain     Hand Dominance Right   Extremity/Trunk Assessment Upper Extremity Assessment Upper Extremity Assessment: Generalized weakness(at least 3+/5 bilaterally, pt tends to keep L hand fisted but is able to open all the way)   Lower Extremity Assessment Lower Extremity Assessment: Generalized weakness(wound on  L shin, bandaged)       Communication Communication Communication: HOH   Cognition Arousal/Alertness: Awake/alert Behavior During Therapy: WFL for tasks assessed/performed Overall Cognitive Status: No family/caregiver present to determine baseline cognitive functioning                                 General Comments: Pt alert and oriented x3, difficulty describing why she was here and some mild difficulty with  story telling details; follows commands well   General Comments       Exercises Other Exercises Other Exercises: Pt instructed in strategies to improve access to tray and set up to improve independence with self feeding; HOB repositioned as well to minimize aspiration risk   Shoulder Instructions      Home Living Family/patient expects to be discharged to:: Assisted living                             Home Equipment: Walker - 4 wheels          Prior Functioning/Environment Level of Independence: Needs assistance  Gait / Transfers Assistance Needed: pt recently able to ambulate with RW to dining hall at ALF ADL's / Homemaking Assistance Needed: pt had some assistance with bathing, dressing, and toileting from staff per chart/pt            OT Problem List: Decreased strength;Decreased range of motion;Decreased cognition;Decreased activity tolerance;Decreased safety awareness;Decreased knowledge of use of DME or AE;Impaired balance (sitting and/or standing)      OT Treatment/Interventions: Self-care/ADL training;Therapeutic exercise;Therapeutic activities;Cognitive remediation/compensation;DME and/or AE instruction;Patient/family education;Balance training    OT Goals(Current goals can be found in the care plan section) Acute Rehab OT Goals Patient Stated Goal: get better OT Goal Formulation: With patient Time For Goal Achievement: 01/20/19 Potential to Achieve Goals: Good ADL Goals Pt Will Perform Eating: with modified independence;with adaptive utensils;sitting Pt Will Perform Grooming: with set-up;with supervision;sitting Pt Will Transfer to Toilet: with mod assist;bedside commode;ambulating  OT Frequency: Min 1X/week   Barriers to D/C:            Co-evaluation              AM-PAC OT "6 Clicks" Daily Activity     Outcome Measure Help from another person eating meals?: A Little Help from another person taking care of personal grooming?: A  Little Help from another person toileting, which includes using toliet, bedpan, or urinal?: A Lot Help from another person bathing (including washing, rinsing, drying)?: A Lot Help from another person to put on and taking off regular upper body clothing?: A Little Help from another person to put on and taking off regular lower body clothing?: A Lot 6 Click Score: 15   End of Session Nurse Communication: Other (comment)(nurse tech: check on pt eating breakfast)  Activity Tolerance: Patient tolerated treatment well Patient left: in bed;with call bell/phone within reach;with bed alarm set  OT Visit Diagnosis: Other abnormalities of gait and mobility (R26.89);Repeated falls (R29.6);Muscle weakness (generalized) (M62.81)                Time: 7829-5621 OT Time Calculation (min): 30 min Charges:  OT General Charges $OT Visit: 1 Visit OT Evaluation $OT Eval Moderate Complexity: 1 Mod OT Treatments $Self Care/Home Management : 8-22 mins  Richrd Prime, MPH, MS, OTR/L ascom [phone] 01/06/19, 9:07 AM

## 2019-01-06 NOTE — Progress Notes (Signed)
[facility]  - [place] [facility]   PATIENT NAME: Megan Donaldson    MR#:  [id number]  DATE OF BIRTH:  10-12-1927  SUBJECTIVE:   Patient appears to be back at baseline. She is alert and oriented times one. Eating food by herself. Tells me she likes the fish. She is also trying to eat some magic cup. Remembers her name, daughter's name, oriented to place.  REVIEW OF SYSTEMS:   Review of Systems  Unable to perform ROS: Dementia   Tolerating Diet: eating reasonably well Tolerating PT: pending  DRUG ALLERGIES:   Allergies  Allergen Reactions  . Amoxicillin     Has patient had a PCN reaction causing immediate rash, facial/tongue/throat swelling, SOB or lightheadedness with hypotension: Unknown Has patient had a PCN reaction causing severe rash involving mucus membranes or skin necrosis: Unknown Has patient had a PCN reaction that required hospitalization: Unknown Has patient had a PCN reaction occurring within the last 10 years: Unknown If all of the above answers are "NO", then may proceed with Cephalosporin use.   . Latex   . Lisinopril   . Mometasone Furoate     VITALS:  Blood pressure (!) 141/86, pulse 65, temperature (!) 97.3 F (36.3 C), temperature source Oral, resp. rate 16, height 5\' 4"  (1.626 m), weight 86.7 kg, SpO2 98 %.  PHYSICAL EXAMINATION:   Physical Exam limeted  GENERAL:  85 y.o.-year-old patient lying in the bed with no acute distress. Weak, deconditioned EYES: Pupils equal, round, reactive to light and accommodation. No scleral icterus.  HEENT: Head atraumatic, normocephalic. Oropharynx and nasopharynx clear.  LUNGS: Normal breath sounds bilaterally, no wheezing, rales, rhonchi. No use of accessory muscles of respiration.  CARDIOVASCULAR: S1, S2 normal. No murmurs, rubs, or gallops.  ABDOMEN: Soft, nontender, nondistended. Bowel sounds present. No organomegaly or mass.  EXTREMITIES: No cyanosis, clubbing o +edema b/l.    NEUROLOGIC:  moves extremities spontaneously. Patient is lethargic. Opens eyes to verbal commands does not communicate. PSYCHIATRIC:  patient is awake and alert x2 SKIN:  tibial shin left superficial ulcer present on admission ulcer    LABORATORY PANEL:  CBC Recent Labs  Lab 01/04/19 0613  WBC 5.7  HGB 9.5*  HCT 29.6*  PLT 161    Chemistries  Recent Labs  Lab 01/04/19 0613  NA 143  K 3.5  CL 105  CO2 25  GLUCOSE 63*  BUN 24*  CREATININE 1.31*  CALCIUM 8.4*  MG 2.3  AST 63*  ALT 17  ALKPHOS 62  BILITOT 0.5   Cardiac Enzymes No results for input(s): TROPONINI in the last 168 hours. RADIOLOGY:  No results found. ASSESSMENT AND PLAN:  Megan Donaldson Phelpsis a 85 y.o.femalewith medical history significant ofdementia possibly vascular,CHF, HTN, HLD, GERD, osteoporosis history of PE/DVT. Presented withhypotension blood pressure initially 73/43 at the SNF. Patient was given a bolus by EMS blood pressure up to 96/51 satting 97% on room air heart rate 56 respirations 12,noted to have temperature down to 93.7 axillary was found to have a wound on her left shin.  *Sepsis without evidence of end organ damage. -Sepsis ruled out--no source of infection noted -low blood pressure was likely due to poor PO intake -Patient came in with blood pressure in the 70s and hypothermia with mild tachycardia which has improved during the ER course -Patient has some encephalopathy elevated lactic acid which is resolved -pt has chronic left tibial shin ulcer-- superficial. No findings of cellulitis-- continue dressing changes per wound nurse  recommendations -currently on IV Rocephin-- discontinued -follow blood and culture urine culture negative -COVID-19 negative  *Left tibial shin ulcer present on admission -LLE medial 5cm x 4cm x 0.1cm: 100% clean, pink, and moist LLE pretibial: scattered ulcerations, all are clean and pink -does not look infected. DC antibiotics -continue dressing changes as  recommended  *Acute metabolic encephalopathy multifactorial due to dehydration, polypharmacy--resolved -hold contributing medication causing more sedation -neuro- exam appears nonfocal however patient is confused. -pt PO diet  *Hyperlipidemia continue home meds  *Hypothyroidism continue Synthroid  *History of PE no longer on anticoagulation  *Gerd/peptic ulcer disease continue Protonix  Physical therapy to work with patient. [place] to come evaluate patient thereafter. Repeat COVID sent. Patient is medically best at baseline for discharge.  CODE STATUS: DNR discussed with daughter DVT Prophylaxis: lovenox  Family communication : spoke with daughter Megan Donaldson on the phone at length. She understands mothers dementia is worsened and has a poor prognosis. She is in agreement with palliative care. If patient continues to do poorly she understands hospice would be an option. Discharge Disposition : back to [facility] if able to--dter's preference   TOTAL TIME TAKING CARE OF THIS PATIENT: **30* minutes.  >50% time spent on counselling and coordination of care  POSSIBLE D/C IN *1 to 2** DAYS, DEPENDING ON CLINICAL CONDITION.  Note: This dictation was prepared with Dragon dictation along with smaller phrase technology. Any transcriptional errors that result from this process are unintentional.  Fritzi Mandes M.D on 01/06/2019 at 2:24 PM  Between 7am to 6pm - Pager - [phone]  After 6pm go to [email or web address]  [facility]   CC: Primary care physician; [facility], Doctors MakingPatient ID: Megan Donaldson, female   DOB: [birth year], 85 y.o.   MRN: [id number]

## 2019-01-06 NOTE — Evaluation (Signed)
Physical Therapy Evaluation Patient Details Name: Megan Donaldson MRN: [id number] DOB: [birth year] Today's Date: 01/06/2019   History of Present Illness  Pt is a 85 y.o. female with medical history significant of advanced Dementia possibly vascular, CHF, HTN, HLD, GERD, osteoporosis history of PE/DVT, bleeding ulcer. She has hx of hypotension and hypothermia; recent fall w/ fx of hand (splint recently removed, has been ambulatory with 4WRW). Pt resides at [facility].  She was admitted w/ Acute metabolic encephalopathy; hypothermia, sepsis criteria. Per current CXR: "No active cardiopulmonary disease is radiographically apparent; lungs clear".  Clinical Impression  Upon evaluation, patient alert and oriented to self and general situation; mildly confused to details/complex information. Pleasant and cooperative, follows simple commands.  Bilat shoulders noted with chronic, arthritic changes, but strength and ROM grossly symmetrical and WFL for basic transfers and gait otherwise.  Currently requiring mod assist for bed mobility; min/mod assist for sit/stand and basic transfers with RW; min assist with RW for short-distance gait (60').  Demonstrates forward flexed posture, mod WBing bilat UEs; walker veers L at times, min assist to correct.  Slow and guarded at times, but no buckling or LOB. Would benefit from skilled PT to address above deficits and promote optimal return to PLOF; Recommend transition to [place] upon discharge from acute hospitalization.       Follow Up Recommendations Home health PT(return to [facility] ALF)    Equipment Recommendations       Recommendations for Other Services       Precautions / Restrictions Precautions Precautions: Fall Restrictions Weight Bearing Restrictions: No      Mobility  Bed Mobility Overal bed mobility: Needs Assistance Bed Mobility: Supine to Sit     Supine to sit: Mod assist        Transfers Overall transfer level: Needs  assistance Equipment used: Rolling walker (2 wheeled) Transfers: Sit to/from Stand Sit to Stand: Min assist;Mod assist         General transfer comment: assist for lift off, standin balance  Ambulation/Gait Ambulation/Gait assistance: Min assist Gait Distance (Feet): 60 Feet Assistive device: Rolling walker (2 wheeled)       General Gait Details: forward flexed posture, mod WBing bilat UEs; walker veers L at times, min assist to correct.  Slow and guarded at times, but no buckling or LOB.  Stairs            Wheelchair Mobility    Modified Rankin (Stroke Patients Only)       Balance Overall balance assessment: Needs assistance Sitting-balance support: No upper extremity supported;Feet supported Sitting balance-Leahy Scale: Good     Standing balance support: Bilateral upper extremity supported Standing balance-Leahy Scale: Fair                               Pertinent Vitals/Pain Pain Assessment: No/denies pain    Home Living Family/patient expects to be discharged to:: Assisted living([place])               Home Equipment: Gilford Rile - 4 wheels Additional Comments: amb small distances in facility using walker    Prior Function Level of Independence: Needs assistance   Gait / Transfers Assistance Needed: pt recently able to ambulate with RW to dining hall at ALF  ADL's / Homemaking Assistance Needed: pt had some assistance with bathing, dressing, and toileting from staff per chart/pt        Hand Dominance  Extremity/Trunk Assessment   Upper Extremity Assessment Upper Extremity Assessment: Generalized weakness(grossly 3-/5 bilat shoulders (chronic arthritic changes), elbows and wrists 3/5)    Lower Extremity Assessment Lower Extremity Assessment: Generalized weakness(grossly 4-/5 throughout)       Communication      Cognition Arousal/Alertness: Awake/alert Behavior During Therapy: WFL for tasks  assessed/performed Overall Cognitive Status: No family/caregiver present to determine baseline cognitive functioning                                 General Comments: Pt alert and oriented x3, difficulty describing why she was here and some mild difficulty with story telling details; follows commands well; pleasant and cooperative      General Comments      Exercises Other Exercises Other Exercises: Sit/stand with RW, mod progressing to min assist with repetition; improved ability with use of armrests to assist   Assessment/Plan    PT Assessment Patient needs continued PT services  PT Problem List Decreased strength;Decreased range of motion;Decreased activity tolerance;Decreased balance;Decreased mobility;Decreased knowledge of use of DME;Decreased safety awareness;Decreased knowledge of precautions;Decreased skin integrity       PT Treatment Interventions DME instruction;Gait training;Functional mobility training;Therapeutic activities;Therapeutic exercise;Balance training;Patient/family education    PT Goals (Current goals can be found in the Care Plan section)  Acute Rehab PT Goals Patient Stated Goal: get better PT Goal Formulation: With patient Time For Goal Achievement: 01/20/19 Potential to Achieve Goals: Good    Frequency Min 2X/week   Barriers to discharge Decreased caregiver support      Co-evaluation               AM-PAC PT "6 Clicks" Mobility  Outcome Measure Help needed turning from your back to your side while in a flat bed without using bedrails?: A Little Help needed moving from lying on your back to sitting on the side of a flat bed without using bedrails?: A Lot Help needed moving to and from a bed to a chair (including a wheelchair)?: A Lot Help needed standing up from a chair using your arms (e.g., wheelchair or bedside chair)?: A Lot Help needed to walk in hospital room?: A Little Help needed climbing 3-5 steps with a railing? : A  Lot 6 Click Score: 14    End of Session Equipment Utilized During Treatment: Gait belt Activity Tolerance: Patient tolerated treatment well Patient left: in chair;with call bell/phone within reach(CNA to place batteries in alarm) Nurse Communication: Mobility status PT Visit Diagnosis: Muscle weakness (generalized) (M62.81);Difficulty in walking, not elsewhere classified (R26.2)    Time: 9604-5409 PT Time Calculation (min) (ACUTE ONLY): 26 min   Charges:   PT Evaluation $PT Eval Moderate Complexity: 1 Mod PT Treatments $Therapeutic Activity: 8-22 mins       Willeen Novak H. Manson Passey, PT, DPT, NCS 01/06/19, 4:42 PM [phone]

## 2019-01-07 LAB — SARS CORONAVIRUS 2 (TAT 6-24 HRS): SARS Coronavirus 2: NEGATIVE

## 2019-01-07 MED ORDER — ENSURE ENLIVE PO LIQD: 237.0000 mL | Freq: Two times a day (BID) | ORAL | 12 refills | Status: AC

## 2019-01-07 MED ORDER — FUROSEMIDE 20 MG PO TABS: 20.0000 mg | ORAL_TABLET | ORAL | 0 refills | Status: DC

## 2019-01-07 MED ORDER — TRAZODONE HCL 50 MG PO TABS: 100.0000 mg | ORAL_TABLET | Freq: Every evening | ORAL | 0 refills | Status: AC | PRN

## 2019-01-07 MED ORDER — ADULT MULTIVITAMIN W/MINERALS CH: 1.0000 | ORAL_TABLET | Freq: Every day | ORAL | 0 refills | Status: AC

## 2019-01-07 NOTE — Discharge Summary (Signed)
[facility] [place] [facility]   PATIENT NAME: Megan Donaldson    MR#:  [id number]  DATE OF BIRTH:  Apr 09, 1927  DATE OF ADMISSION:  01/03/2019 ADMITTING PHYSICIAN: Enedina Finner, MD  DATE OF DISCHARGE: 01/07/2019  PRIMARY CARE PHYSICIAN: [facility]    ADMISSION DIAGNOSIS:  Lower urinary tract infectious disease [N39.0] Constipation [K59.00] Sepsis (HCC) [A41.9]  DISCHARGE DIAGNOSIS:  acute metabolic encephalopathy due to dehydration and polypharmacy resolved chronic left tibial shin also present on admission Dementia hypothyroidism SECONDARY DIAGNOSIS:   Past Medical History:  Diagnosis Date  . CHF (congestive heart failure) (HCC)   . Dementia (HCC)   . GERD (gastroesophageal reflux disease)   . Hyperlipidemia   . Hypertension   . Osteoporosis   . Personal history of other venous thrombosis and embolism   . Personal history of PE (pulmonary embolism)     HOSPITAL COURSE:   Evelena Masci Phelpsis a 85 y.o.femalewith medical history significant ofdementia possibly vascular,CHF, HTN, HLD, GERD, osteoporosis history of PE/DVT. Presented withhypotension blood pressure initially 73/43 at the SNF. Patient was given a bolus by EMS blood pressure up to 96/51 satting 97% on room air heart rate 56 respirations 12,noted to have temperature down to 93.7 axillary was found to have a wound on her left shin.  *Sepsis without evidence of end organ damage. -Sepsis ruled out--no source of infection noted -low blood pressure was likely due to poor PO intake -Patient came in with blood pressure in the 70s and hypothermia with mild tachycardia which has improved during the ER course -Patient has some encephalopathy elevated lactic acid which is resolved -pt has chronic left tibial shin ulcer-- superficial. No findings of cellulitis-- continue dressing changes per wound nurse recommendations -currently on IV Rocephin-- discontinued -follow blood and  culture urine culture negative -COVID-19 negative  *Left tibial shin ulcer present on admission -LLE medial 5cm x 4cm x 0.1cm: 100% clean, pink, and moist LLE pretibial: scattered ulcerations, all are clean and pink -does not look infected. DC antibiotics -continue dressing changes as recommended  *Acute metabolic encephalopathy multifactorial due to dehydration, polypharmacy and underlying chronic Dementia--resolved -improved. Pt more awake and alert and communicative -per Daughter --pt has been having progressive dementia -pt PO diet  *Hyperlipidemia continue home meds  *Hypothyroidism continue Synthroid  *History of PE no longer on anticoagulation  *Gerd/peptic ulcer disease continue Protonix  Physical therapy input noted--HHPT discussed with case manager. Patient will discharged to [facility] today repeat COVID 19 is negative  CODE STATUS:DNR discussed with daughter DVT Prophylaxis:lovenox CONSULTS OBTAINED:    DRUG ALLERGIES:   Allergies  Allergen Reactions  . Amoxicillin     Has patient had a PCN reaction causing immediate rash, facial/tongue/throat swelling, SOB or lightheadedness with hypotension: Unknown Has patient had a PCN reaction causing severe rash involving mucus membranes or skin necrosis: Unknown Has patient had a PCN reaction that required hospitalization: Unknown Has patient had a PCN reaction occurring within the last 10 years: Unknown If all of the above answers are "NO", then may proceed with Cephalosporin use.   . Latex   . Lisinopril   . Mometasone Furoate     DISCHARGE MEDICATIONS:   Allergies as of 01/07/2019      Reactions   Amoxicillin    Has patient had a PCN reaction causing immediate rash, facial/tongue/throat swelling, SOB or lightheadedness with hypotension: Unknown Has patient had a PCN reaction causing severe rash involving mucus membranes or skin necrosis: Unknown Has  patient had a PCN reaction that required  hospitalization: Unknown Has patient had a PCN reaction occurring within the last 10 years: Unknown If all of the above answers are "NO", then may proceed with Cephalosporin use.   Latex    Lisinopril    Mometasone Furoate       Medication List    TAKE these medications   acetaminophen 500 MG tablet Commonly known as: TYLENOL Take 1,000 mg by mouth 2 (two) times daily.   aspirin EC 81 MG tablet Take 81 mg by mouth daily.   Calcium 600-D 600-400 MG-UNIT Tabs Generic drug: Calcium Carbonate-Vitamin D3 Take 1 tablet by mouth 2 (two) times daily.   escitalopram 10 MG tablet Commonly known as: LEXAPRO Take 10 mg by mouth daily.   feeding supplement (ENSURE ENLIVE) Liqd Take 237 mLs by mouth 2 (two) times daily between meals.   Ferrous Sulfate 142 (45 Fe) MG Tbcr Take 45 mg by mouth daily.   furosemide 20 MG tablet Commonly known as: LASIX Take 1 tablet (20 mg total) by mouth every other day. What changed: when to take this   lamoTRIgine 25 MG tablet Commonly known as: LAMICTAL Take 50 mg by mouth at bedtime.   lamoTRIgine 25 MG tablet Commonly known as: LAMICTAL Take 37.5 mg by mouth daily.   levothyroxine 75 MCG tablet Commonly known as: SYNTHROID Take 75 mcg by mouth daily.   metoprolol succinate 25 MG 24 hr tablet Commonly known as: TOPROL-XL Take 1 tablet (25 mg total) by mouth daily.   multivitamin with minerals Tabs tablet Take 1 tablet by mouth daily. Start taking on: January 08, 2019   neomycin-bacitracin-polymyxin Oint Commonly known as: NEOSPORIN Apply 1 application topically daily. (apply to left leg)   pantoprazole 40 MG tablet Commonly known as: PROTONIX Take 40 mg by mouth 2 (two) times daily.   PEG 3350 17 g Pack Take 17 g by mouth daily as needed (moderate constipation).   senna-docusate 8.6-50 MG tablet Commonly known as: Senokot-S Take 2 tablets by mouth at bedtime.   simvastatin 40 MG tablet Commonly known as: ZOCOR Take 40 mg  by mouth at bedtime.   traZODone 50 MG tablet Commonly known as: DESYREL Take 2 tablets (100 mg total) by mouth at bedtime as needed for sleep. What changed:   medication strength  when to take this  reasons to take this   white petrolatum Gel Commonly known as: VASELINE Apply 1 application topically 3 (three) times daily. Apply to lips       If you experience worsening of your admission symptoms, develop shortness of breath, life threatening emergency, suicidal or homicidal thoughts you must seek medical attention immediately by calling 911 or calling your MD immediately  if symptoms less severe.  You Must read complete instructions/literature along with all the possible adverse reactions/side effects for all the Medicines you take and that have been prescribed to you. Take any new Medicines after you have completely understood and accept all the possible adverse reactions/side effects.   Please note  You were cared for by a hospitalist during your hospital stay. If you have any questions about your discharge medications or the care you received while you were in the hospital after you are discharged, you can call the unit and asked to speak with the hospitalist on call if the hospitalist that took care of you is not available. Once you are discharged, your primary care physician will handle any further medical issues. Please note that  NO REFILLS for any discharge medications will be authorized once you are discharged, as it is imperative that you return to your primary care physician (or establish a relationship with a primary care physician if you do not have one) for your aftercare needs so that they can reassess your need for medications and monitor your lab values. Today   SUBJECTIVE   Awake alert so far having a good day. No new issues per RN  VITAL SIGNS:  Blood pressure (!) 156/84, pulse 60, temperature 97.6 F (36.4 C), temperature source Oral, resp. rate 16, height 5\' 4"   (1.626 m), weight 86.7 kg, SpO2 97 %.  I/O:    Intake/Output Summary (Last 24 hours) at 01/07/2019 1245 Last data filed at 01/07/2019 0900 Gross per 24 hour  Intake 600 ml  Output 800 ml  Net -200 ml    PHYSICAL EXAMINATION:  GENERAL:  85 y.o.-year-old patient lying in the bed with no acute distress.  EYES: Pupils equal, round, reactive to light and accommodation. No scleral icterus. Extraocular muscles intact.  HEENT: Head atraumatic, normocephalic. Oropharynx and nasopharynx clear.  NECK:  Supple, no jugular venous distention. No thyroid enlargement, no tenderness.  LUNGS: Normal breath sounds bilaterally, no wheezing, rales,rhonchi or crepitation. No use of accessory muscles of respiration.  CARDIOVASCULAR: S1, S2 normal. No murmurs, rubs, or gallops.  ABDOMEN: Soft, non-tender, non-distended. Bowel sounds present. No organomegaly or mass.  EXTREMITIES: chronic left tibial chilled superficial ulcer present on admission NEUROLOGIC: Cranial nerves II through XII are intact. Muscle strength 5/5 in all extremities. Sensation intact. Gait not checked.  PSYCHIATRIC: The patient is alert and oriented x 2, at baseline has dementia SKIN: as above  DATA REVIEW:   CBC  Recent Labs  Lab 01/04/19 0613  WBC 5.7  HGB 9.5*  HCT 29.6*  PLT 161    Chemistries  Recent Labs  Lab 01/04/19 0613  NA 143  K 3.5  CL 105  CO2 25  GLUCOSE 63*  BUN 24*  CREATININE 1.31*  CALCIUM 8.4*  MG 2.3  AST 63*  ALT 17  ALKPHOS 62  BILITOT 0.5    Microbiology Results   Recent Results (from the past 240 hour(s))  Blood culture (routine x 2)     Status: None (Preliminary result)   Collection Time: 01/03/19  5:36 PM   Specimen: BLOOD  Result Value Ref Range Status   Specimen Description   Final    BLOOD Blood Culture results may not be optimal due to an inadequate volume of blood received in culture bottles   Special Requests   Final    BOTTLES DRAWN AEROBIC AND ANAEROBIC RIGHT  ANTECUBITAL   Culture   Final    NO GROWTH 4 DAYS Performed at [facility], [address] [place], [place] [address]    Report Status PENDING  Incomplete  Urine culture     Status: Abnormal   Collection Time: 01/03/19  6:13 PM   Specimen: Urine, Random  Result Value Ref Range Status   Specimen Description   Final    URINE, RANDOM Performed at [facility], [address] [place], [place] [address]    Special Requests   Final    NONE Performed at [facility], [address] [place], [place] [address]    Culture (A)  Final    <10,000 COLONIES/mL INSIGNIFICANT GROWTH Performed at [facility], [place] [address] [place],  [address]    Report Status 01/04/2019 FINAL  Final  SARS CORONAVIRUS 2 (TAT 6-24 HRS) Nasopharyngeal Nasopharyngeal Swab     Status: None   Collection Time: 01/03/19  6:13 PM   Specimen: Nasopharyngeal Swab  Result Value Ref Range Status   SARS Coronavirus 2 NEGATIVE NEGATIVE Final    Comment: (NOTE) SARS-CoV-2 target nucleic acids are NOT DETECTED. The SARS-CoV-2 RNA is generally detectable in upper and lower respiratory specimens during the acute phase of infection. Negative results do not preclude SARS-CoV-2 infection, do not rule out co-infections with other pathogens, and should not be used as the sole basis for treatment or other patient management decisions. Negative results must be combined with clinical observations, patient history, and epidemiological information. The expected result is Negative. Fact Sheet for Patients: HairSlick.no Fact Sheet for Healthcare Providers: quierodirigir.com This test is not yet approved or cleared by the [place] FDA and  has been authorized for detection and/or diagnosis of SARS-CoV-2 by FDA under an Emergency Use Authorization (EUA). This EUA will remain  in effect (meaning this test can be used) for the  duration of the COVID-19 declaration under Section 56 4(b)(1) of the Act, 21 U.S.C. section 360bbb-3(b)(1), unless the authorization is terminated or revoked sooner. Performed at [facility], [address] [place], [place] [address]   Blood culture (routine x 2)     Status: None (Preliminary result)   Collection Time: 01/03/19  8:29 PM   Specimen: BLOOD  Result Value Ref Range Status   Specimen Description   Final    BLOOD Blood Culture results may not be optimal due to an excessive volume of blood received in culture bottles   Special Requests   Final    BOTTLES DRAWN AEROBIC AND ANAEROBIC LEFT ANTECUBITAL   Culture   Final    NO GROWTH 4 DAYS Performed at [facility], [address] [place], [place] [address]    Report Status PENDING  Incomplete  SARS CORONAVIRUS 2 (TAT 6-24 HRS) Nasopharyngeal Nasopharyngeal Swab     Status: None   Collection Time: 01/06/19  3:20 PM   Specimen: Nasopharyngeal Swab  Result Value Ref Range Status   SARS Coronavirus 2 NEGATIVE NEGATIVE Final    Comment: (NOTE) SARS-CoV-2 target nucleic acids are NOT DETECTED. The SARS-CoV-2 RNA is generally detectable in upper and lower respiratory specimens during the acute phase of infection. Negative results do not preclude SARS-CoV-2 infection, do not rule out co-infections with other pathogens, and should not be used as the sole basis for treatment or other patient management decisions. Negative results must be combined with clinical observations, patient history, and epidemiological information. The expected result is Negative. Fact Sheet for Patients: HairSlick.no Fact Sheet for Healthcare Providers: quierodirigir.com This test is not yet approved or cleared by the [place] FDA and  has been authorized for detection and/or diagnosis of SARS-CoV-2 by FDA under an Emergency Use Authorization (EUA). This EUA will remain  in  effect (meaning this test can be used) for the duration of the COVID-19 declaration under Section 56 4(b)(1) of the Act, 21 U.S.C. section 360bbb-3(b)(1), unless the authorization is terminated or revoked sooner. Performed at [facility], [address] [place], [place] [address]     RADIOLOGY:  No results found.   CODE STATUS:     Code Status Orders  (From admission, onward)         Start     Ordered   01/03/19 2256  Do not attempt resuscitation (DNR)  Continuous  Question Answer Comment  In the event of cardiac or respiratory ARREST Do not call a "code blue"   In the event of cardiac or respiratory ARREST Do not perform Intubation, CPR, defibrillation or ACLS   In the event of cardiac or respiratory ARREST Use medication by any route, position, wound care, and other measures to relive pain and suffering. May use oxygen, suction and manual treatment of airway obstruction as needed for comfort.      01/03/19 2255        Code Status History    Date Active Date Inactive Code Status Order ID Comments User Context   11/10/2018 2311 11/15/2018 2359 DNR [id number]  Jimmye Norman, NP ED   09/01/2018 0231 09/01/2018 2045 DNR [id number]  Donato Heinz, MD Inpatient   02/01/2018 2107 02/03/2018 2129 DNR [id number]  Ihor Austin, MD Inpatient   09/17/2017 2300 09/19/2017 1904 DNR [id number]  Cammy Copa, MD Inpatient   08/07/2017 1553 08/08/2017 1938 DNR [id number]  Milagros Loll, MD ED   08/07/2017 1528 08/07/2017 1553 DNR [id number]  Willy Eddy, MD ED   Advance Care Planning Activity    Advance Directive Documentation     Most Recent Value  Type of Advance Directive  Healthcare Power of Attorney, Out of facility DNR (pink MOST or yellow form)  Pre-existing out of facility DNR order (yellow form or pink MOST form)  -  "MOST" Form in Place?  -     Family Discussion: Consults:   TOTAL TIME TAKING CARE OF THIS PATIENT: **40* minutes.    Enedina Finner M.D on  01/07/2019 at 12:45 PM  Between 7am to 6pm - Pager - [phone] After 6pm go to [email or web address] - password [email or web address]  [facility]    CC: Primary care physician; [facility]

## 2019-01-07 NOTE — NC FL2 (Addendum)
[place] MEDICAID FL2 LEVEL OF CARE SCREENING TOOL     IDENTIFICATION  Patient Name: Megan Donaldson Birthdate: 12/27/1927 Sex: female Admission Date (Current Location): 01/03/2019  [place]   Facility and Address:  [facility], [address], [place], [place] [address]      Provider Number: [id number]  Attending Physician Name and Address:  Enedina FinnerPatel, Sona, MD  Relative Name and Phone Number:  Barton FannyBarbara Kassmann [phone]    Current Level of Care: Hospital Recommended Level of Care: Assisted Living Facility Prior Approval Number:    Date Approved/Denied:   PASRR Number:    Discharge Plan: Domiciliary (Rest home)    Current Diagnoses: Patient Active Problem List   Diagnosis Date Noted  . Constipation   . Dementia without behavioral disturbance (HCC)   . Conjunctivitis 01/04/2019  . Acute encephalopathy 01/04/2019  . Acute metabolic encephalopathy 01/03/2019  . Sepsis (HCC) 01/03/2019  . Acute lower UTI 01/03/2019  . Hypotension 01/03/2019  . Multiple open wounds of lower leg 01/03/2019  . Prolonged QT interval 01/03/2019  . Bilateral lower leg cellulitis 11/10/2018  . Acute gastric ulcer with hemorrhage 03/10/2018  . GI bleed 02/01/2018  . Altered mental status 09/17/2017  . Seizure (HCC) 08/07/2017  . 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 Gastroesophageal reflux disease 10/22/2015  . Mild depression (HCC) 10/22/2015  . H/O: GI bleed 07/30/2015  . History of deep venous thrombosis 07/30/2015  . History of pulmonary embolism 07/30/2015  . 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 S/P closed reduction of dislocated total hip prosthesis 03/19/2015  . Essential hypertension 03/23/2014  . Hyperlipidemia 03/23/2014  . Hypothyroidism 03/23/2014    Orientation RESPIRATION BLADDER Height & Weight     Self, Place, Time, Situation  Normal Incontinent Weight: 86.7 kg Height:  5\' 4"  (162.6  cm)  BEHAVIORAL SYMPTOMS/MOOD NEUROLOGICAL BOWEL NUTRITION STATUS      Continent Diet(Dysphasia III)  AMBULATORY STATUS COMMUNICATION OF NEEDS Skin   Limited Assist Verbally Other (Comment)(left leg wound care)                       Personal Care Assistance Level of Assistance  Bathing, Feeding, Dressing Bathing Assistance: Limited assistance Feeding assistance: Limited assistance Dressing Assistance: Limited assistance     Functional Limitations Info  Sight, Hearing Sight Info: Adequate(with glasses) Hearing Info: Impaired(hard of hearing)      SPECIAL CARE FACTORS FREQUENCY  PT (By licensed PT), OT (By licensed OT)     PT Frequency: 3 X week OT Frequency: 2 X week            Contractures Contractures Info: Not present    Additional Factors Info  Code Status, Allergies(DNR) Code Status Info: DNR Allergies Info: Amoxicillin, latex, lisinopril, mometason furoate           Current Medications (01/07/2019):  This is the current hospital active medication list Current Facility-Administered Medications  Medication Dose Route Frequency Provider Last Rate Last Dose  . 0.9 %  sodium chloride infusion   Intravenous PRN Enedina FinnerPatel, Sona, MD   Stopped at 01/05/19 0036  . acetaminophen (TYLENOL) tablet 650 mg  650 mg Oral Q6H PRN Doutova, Anastassia, MD       Or  . acetaminophen (TYLENOL) suppository 650 mg  650 mg Rectal Q6H PRN Doutova, Anastassia, MD      . aspirin EC tablet 81 mg  81 mg Oral Daily Therisa Doyneoutova, Anastassia, MD  81 mg at 01/07/19 0953  . enoxaparin (LOVENOX) injection 40 mg  40 mg Subcutaneous Q24H Doutova, Anastassia, MD   40 mg at 01/07/19 0042  . erythromycin ophthalmic ointment   Both Eyes Q6H Toy Baker, MD   1 application at 16/10/96 0513  . escitalopram (LEXAPRO) tablet 10 mg  10 mg Oral Daily Toy Baker, MD   10 mg at 01/07/19 0954  . feeding supplement (ENSURE ENLIVE) (ENSURE ENLIVE) liquid 237 mL  237 mL Oral BID BM Fritzi Mandes, MD    237 mL at 01/07/19 0953  . lamoTRIgine (LAMICTAL) tablet 37.5 mg  37.5 mg Oral Daily Doutova, Anastassia, MD   37.5 mg at 01/07/19 0953  . lamoTRIgine (LAMICTAL) tablet 50 mg  50 mg Oral QHS Toy Baker, MD   50 mg at 01/06/19 2118  . levothyroxine (SYNTHROID) tablet 75 mcg  75 mcg Oral Daily Toy Baker, MD   75 mcg at 01/07/19 0454  . multivitamin with minerals tablet 1 tablet  1 tablet Oral Daily Fritzi Mandes, MD   1 tablet at 01/07/19 0954  . pantoprazole (PROTONIX) EC tablet 40 mg  40 mg Oral BID Toy Baker, MD   40 mg at 01/07/19 0953  . polyethylene glycol (MIRALAX / GLYCOLAX) packet 17 g  17 g Oral Daily PRN Doutova, Anastassia, MD      . senna-docusate (Senokot-S) tablet 2 tablet  2 tablet Oral QHS Toy Baker, MD   2 tablet at 01/06/19 2118  . simvastatin (ZOCOR) tablet 40 mg  40 mg Oral QHS Toy Baker, MD   40 mg at 01/06/19 2118     Discharge Medications: Please see discharge summary for a list of discharge medications. Allergies as of 01/07/2019      Reactions   Amoxicillin    Has patient had a PCN reaction causing immediate rash, facial/tongue/throat swelling, SOB or lightheadedness with hypotension: Unknown Has patient had a PCN reaction causing severe rash involving mucus membranes or skin necrosis: Unknown Has patient had a PCN reaction that required hospitalization: Unknown Has patient had a PCN reaction occurring within the last 10 years: Unknown If all of the above answers are "NO", then may proceed with Cephalosporin use.   Latex    Lisinopril    Mometasone Furoate          Medication List    TAKE these medications   acetaminophen 500 MG tablet Commonly known as: TYLENOL Take 1,000 mg by mouth 2 (two) times daily.   aspirin EC 81 MG tablet Take 81 mg by mouth daily.   Calcium 600-D 600-400 MG-UNIT Tabs Generic drug: Calcium Carbonate-Vitamin D3 Take 1 tablet by mouth 2 (two) times daily.    escitalopram 10 MG tablet Commonly known as: LEXAPRO Take 10 mg by mouth daily.   feeding supplement (ENSURE ENLIVE) Liqd Take 237 mLs by mouth 2 (two) times daily between meals.   Ferrous Sulfate 142 (45 Fe) MG Tbcr Take 45 mg by mouth daily.   furosemide 20 MG tablet Commonly known as: LASIX Take 1 tablet (20 mg total) by mouth every other day. What changed: when to take this   lamoTRIgine 25 MG tablet Commonly known as: LAMICTAL Take 50 mg by mouth at bedtime.   lamoTRIgine 25 MG tablet Commonly known as: LAMICTAL Take 37.5 mg by mouth daily.   levothyroxine 75 MCG tablet Commonly known as: SYNTHROID Take 75 mcg by mouth daily.   metoprolol succinate 25 MG 24 hr tablet Commonly known as: TOPROL-XL Take  1 tablet (25 mg total) by mouth daily.   multivitamin with minerals Tabs tablet Take 1 tablet by mouth daily. Start taking on: January 08, 2019   neomycin-bacitracin-polymyxin Oint Commonly known as: NEOSPORIN Apply 1 application topically daily. (apply to left leg)   pantoprazole 40 MG tablet Commonly known as: PROTONIX Take 40 mg by mouth 2 (two) times daily.   PEG 3350 17 g Pack Take 17 g by mouth daily as needed (moderate constipation).   senna-docusate 8.6-50 MG tablet Commonly known as: Senokot-S Take 2 tablets by mouth at bedtime.   simvastatin 40 MG tablet Commonly known as: ZOCOR Take 40 mg by mouth at bedtime.   traZODone 50 MG tablet Commonly known as: DESYREL Take 2 tablets (100 mg total) by mouth at bedtime as needed for sleep. What changed:   medication strength  when to take this  reasons to take this   white petrolatum Gel Commonly known as: VASELINE Apply 1 application topically 3 (three) times daily. Apply to lips   Relevant Imaging Results:  Relevant Lab Results:   Additional Information SS # [id number]  Collie Siad, RN

## 2019-01-07 NOTE — Progress Notes (Signed)
Patient ID: Megan Donaldson, female   DOB: 02-09-1928, 85 y.o.   MRN: [id number] patient was supposed to go to [facility] assisted living. They had accepted her however someone from [facility] called social worker and said they would not take her over the weekend. They will need to come reassess her prior to her discharging. Patient did walk 60 feet today and has been participating with physical therapy. Will have to hold discharge.

## 2019-01-07 NOTE — TOC Transition Note (Addendum)
Transition of Care [facility]) - CM/SW Discharge Note   Patient Details  Name: Megan Donaldson MRN: [id number] Date of Birth: [birth year]  Transition of Care [facility]) CM/SW Contact:  Marshell Garfinkel, RN Phone Number: 01/07/2019, 1:17 PM   Clinical Narrative:    Patient to discharge back to [facility] by EMS today. I spoke with [place] med tech at [employer]; orders have been sent including FL2. SPoke with daughter Pamala Hurry and she would like to use [facility] home health again. Sharmon Revere with [facility] has accepted patient. RN updated that EMS packet to include FL2, Med necessity and DNR.    Final next level of care: Assisted Living Barriers to Discharge: No Barriers Identified   Patient Goals and CMS Choice        Discharge Placement                       Discharge Plan and Services                          HH Arranged: OT, PT, RN [facility] Agency: [place] Date [place]: 01/07/19 Time [place]: 1317 Representative spoke with at [place]: Malachy Mood ROse  Social Determinants of Health ([place]) Interventions     Readmission Risk Interventions Readmission Risk Prevention Plan 01/05/2019 11/13/2018  Medication Screening - Complete  Transportation Screening Complete Complete  HRI or Home Care Consult Complete -  Medication Review (RN Care Manager) Complete -  Some recent data might be hidden

## 2019-01-07 NOTE — TOC Progression Note (Addendum)
Transition of Care [facility]) - Progression Note    Patient Details  Name: MEYER DOCKERY MRN: [id number] Date of Birth: [birth year]  Transition of Care [facility]) CM/SW Contact  Marshell Garfinkel, RN Phone Number: 01/07/2019, 2:04 PM  Clinical Narrative:     Callback from [facility] stating that they cannot take patient until they screen her. MD and RN updated. Daughter Pamala Hurry notified.   Expected Discharge Plan: Assisted Living Barriers to Discharge: No Barriers Identified  Expected Discharge Plan and Services Expected Discharge Plan: Assisted Living       Living arrangements for the past 2 months: [place] Expected Discharge Date: 01/07/19                         HH Arranged: OT, PT, RN [place] Agency: [place] Date [place]: 01/07/19 Time [place]: 3491 Representative spoke with at [place]: Malachy Mood ROse   Social Determinants of Health ([place]) Interventions    Readmission Risk Interventions Readmission Risk Prevention Plan 01/05/2019 11/13/2018  Medication Screening - Complete  Transportation Screening Complete Complete  HRI or Home Care Consult Complete -  Medication Review (RN Care Manager) Complete -  Some recent data might be hidden

## 2019-01-08 LAB — CULTURE, BLOOD (ROUTINE X 2)
Culture: NO GROWTH
Culture: NO GROWTH

## 2019-01-08 NOTE — Progress Notes (Signed)
The patient was up on the chair for most of the day. The patient ambulated with the nurse 12ft using the front wheel walker. Dressing changed performed on the left lower leg. Aox3.

## 2019-01-08 NOTE — TOC Progression Note (Signed)
Transition of Care [facility]) - Progression Note    Patient Details  Name: Megan Donaldson MRN: [id number] Date of Birth: April 27, 1927  Transition [facility]) CM/SW Contact  Truitt Merle, LCSW Phone Number: 01/08/2019, 10:30 AM  Clinical Narrative:    Spoke with [facility] Director [phone], who confirmed patient needs to be reassessed before returning to [facility] and no one available to do on the weekend. Samantha stated Director for patient's unit would make contact with CSW on Monday morning.    Expected Discharge Plan: Assisted Living Barriers to Discharge: No Barriers Identified  Expected Discharge Plan and Services Expected Discharge Plan: Assisted Living       Living arrangements for the past 2 months: [place] Expected Discharge Date: 01/07/19                         HH Arranged: OT, PT, RN [place] Agency: [place] Date [place]: 01/07/19 Time [place]: [id number] Representative spoke with at [place]: Malachy Mood ROse   Social Determinants of Health ([place]) Interventions    Readmission Risk Interventions Readmission Risk Prevention Plan 01/05/2019 11/13/2018  Medication Screening - Complete  Transportation Screening Complete Complete  HRI or Home Care Consult Complete -  Medication Review (RN Care Manager) Complete -  Some recent data might be hidden

## 2019-01-08 NOTE — Progress Notes (Signed)
[place] [facility] [place] NAME: Megan Donaldson    MR#:  [id number]  DATE OF BIRTH:  Jan 23, 1928  SUBJECTIVE:    REVIEW OF SYSTEMS:   ROS Tolerating Diet: Tolerating PT:   DRUG ALLERGIES:   Allergies  Allergen Reactions  . Amoxicillin     Has patient had a PCN reaction causing immediate rash, facial/tongue/throat swelling, SOB or lightheadedness with hypotension: Unknown Has patient had a PCN reaction causing severe rash involving mucus membranes or skin necrosis: Unknown Has patient had a PCN reaction that required hospitalization: Unknown Has patient had a PCN reaction occurring within the last 10 years: Unknown If all of the above answers are "NO", then may proceed with Cephalosporin use.   . Latex   . Lisinopril   . Mometasone Furoate     VITALS:  Blood pressure (!) 150/74, pulse 82, temperature 98 F (36.7 C), temperature source Oral, resp. rate 18, height 5\' 4"  (1.626 m), weight 86.7 kg, SpO2 95 %.  PHYSICAL EXAMINATION:   Physical Exam  GENERAL:  85 y.o.-year-old patient lying in the bed with no acute distress.  EYES: Pupils equal, round, reactive to light and accommodation. No scleral icterus. Extraocular muscles intact.  HEENT: Head atraumatic, normocephalic. Oropharynx and nasopharynx clear.  NECK:  Supple, no jugular venous distention. No thyroid enlargement, no tenderness.  LUNGS: Normal breath sounds bilaterally, no wheezing, rales, rhonchi. No use of accessory muscles of respiration.  CARDIOVASCULAR: S1, S2 normal. No murmurs, rubs, or gallops.  ABDOMEN: Soft, nontender, nondistended. Bowel sounds present. No organomegaly or mass.  EXTREMITIES: No cyanosis, clubbing or edema b/l.    NEUROLOGIC: Cranial nerves II through XII are intact. No focal Motor or sensory deficits b/l.   PSYCHIATRIC:  patient is alert and oriented x 3.  SKIN: No obvious rash, lesion, or ulcer.   LABORATORY PANEL:  CBC Recent Labs  Lab  01/04/19 0613  WBC 5.7  HGB 9.5*  HCT 29.6*  PLT 161    Chemistries  Recent Labs  Lab 01/04/19 0613  NA 143  K 3.5  CL 105  CO2 25  GLUCOSE 63*  BUN 24*  CREATININE 1.31*  CALCIUM 8.4*  MG 2.3  AST 63*  ALT 17  ALKPHOS 62  BILITOT 0.5   Cardiac Enzymes No results for input(s): TROPONINI in the last 168 hours. RADIOLOGY:  No results found. ASSESSMENT AND PLAN:  Gerrianne Aydelott Phelpsis a 85 y.o.femalewith medical history significant ofdementia possibly vascular,CHF, HTN, HLD, GERD, osteoporosis history of PE/DVT. Presented withhypotension blood pressure initially 73/43 at the SNF. Patient was given a bolus by EMS blood pressure up to 96/51 satting 97% on room air heart rate 56 respirations 12,noted to have temperature down to 93.7 axillary was found to have a wound on her left shin.  *Sepsis without evidence of end organ damage. -Sepsis ruled out--no source of infection noted -low blood pressure was likely due to poor PO intake -Patient has some encephalopathy elevated lactic acid which is resolved--mentation is back to baseline -pt has chronicleft tibial shin ulcer-- superficial. No findings of cellulitis-- continue dressing changes per wound nurse recommendations -wason IV Rocephin-- discontinued since no surce of infection identified -follow blood and culture urine culture negative -COVID-19 negative on admission and repeat COVID-19 negative for discharge  *Left tibial shin ulcer present on admission -LLE medial 5cm x 4cm x 0.1cm: 100% clean, pink, and moist LLE pretibial: scattered ulcerations, all are clean and pink -does not look  infected. DC antibiotics -continue dressing changes as recommended  *Acute metabolic encephalopathy multifactorial due to dehydration, polypharmacy and underlying chronic Dementia-improved.  Pt more awake and alert and communicative -per Daughter --pt has been having progressive dementia -ptPO diet  *Hyperlipidemia continue  home meds  *Hypothyroidism continue Synthroid  *History of PE no longer on anticoagulation  *Gerd/peptic ulcer disease continue Protonix  Physical therapy input noted--HHPT discussed with case manager. Patient will discharged to [facility] on Monday repeat COVID 19 is negative  CODE STATUS:DNRdiscussed with daughter DVT Prophylaxis:lovenox  TOTAL TIME TAKING CARE OF THIS PATIENT: *25* minutes.  >50% time spent on counselling and coordination of care  POSSIBLE D/C tomorrow to [facility] once evaluated by staff from [facility]  Note: This dictation was prepared with Dragon dictation along with smaller phrase technology. Any transcriptional errors that result from this process are unintentional.  Enedina Finner M.D on 01/08/2019 at 12:46 PM  Between 7am to 6pm - Pager - [phone]  After 6pm go to [email or web address]  [facility]   CC: Primary care physician; [facility], Doctors MakingPatient ID: Imelda Pillow, female   DOB: 22-Jul-1927, 85 y.o.   MRN: [id number]

## 2019-01-09 NOTE — Discharge Summary (Signed)
[facility] [place] at [facility]   PATIENT NAME: Megan Donaldson    MR#:  [id number]  DATE OF BIRTH:  1927/03/21  DATE OF ADMISSION:  01/03/2019  ADMITTING PHYSICIAN: Enedina Finner, MD  DATE OF DISCHARGE: 01/09/2019  PRIMARY CARE PHYSICIAN: [facility]    ADMISSION DIAGNOSIS:  Lower urinary tract infectious disease [N39.0] Constipation [K59.00] Sepsis (HCC) [A41.9]  DISCHARGE DIAGNOSIS:  acute metabolic encephalopathy due to mild dehydration and polypharmacy resolved chronic left tibial shin also present on admission Dementia hypothyroidism SECONDARY DIAGNOSIS:   Past Medical History:  Diagnosis Date  . CHF (congestive heart failure) (HCC)   . Dementia (HCC)   . GERD (gastroesophageal reflux disease)   . Hyperlipidemia   . Hypertension   . Osteoporosis   . Personal history of other venous thrombosis and embolism   . Personal history of PE (pulmonary embolism)     HOSPITAL COURSE:   Megan Donaldson a 85 y.o.femalewith medical history significant ofdementia possibly vascular,CHF, HTN, HLD, GERD, osteoporosis history of PE/DVT. Presented withhypotension blood pressure initially 73/43 at the SNF. Patient was given a bolus by EMS blood pressure up to 96/51 satting 97% on room air heart rate 56 respirations 12,noted to have temperature down to 93.7 axillary was found to have a wound on her left shin.  *Sepsis without evidence of end organ damage. -Sepsis ruled out--no source of infection noted -low blood pressure was likely due to poor PO intake -Patient came in with blood pressure in the 70s and hypothermia with mild tachycardia which has improved during the ER course -Patient has some encephalopathy elevated lactic acid which is resolved -pt has chronic left tibial shin ulcer-- superficial. No findings of cellulitis-- continue dressing changes per wound nurse recommendations -currently on IV Rocephin-- discontinued -follow blood  and culture urine culture negative -COVID-19 negative  *Left tibial shin ulcer present on admission -LLE medial 5cm x 4cm x 0.1cm: 100% clean, pink, and moist LLE pretibial: scattered ulcerations, all are clean and pink -does not look infected. DC antibiotics -continue dressing changes as recommended  *Acute metabolic encephalopathy multifactorial due to dehydration, polypharmacy and underlying chronic Dementia--resolved -improved. Pt more awake and alert and communicative -per Daughter --pt has been having progressive dementia -pt PO diet  *Hyperlipidemia continue home meds  *Hypothyroidism continue Synthroid  *History of PE no longer on anticoagulation  *Gerd/peptic ulcer disease continue Protonix  Physical therapy input noted--HHPT discussed with case manager. Patient [facility] to [facility] as per d/w CSW repeat COVID 19 is negative  CODE STATUS:DNR discussed with daughter DVT Prophylaxis:lovenox CONSULTS OBTAINED:    DRUG ALLERGIES:   Allergies  Allergen Reactions  . Amoxicillin     Has patient had a PCN reaction causing immediate rash, facial/tongue/throat swelling, SOB or lightheadedness with hypotension: Unknown Has patient had a PCN reaction causing severe rash involving mucus membranes or skin necrosis: Unknown Has patient had a PCN reaction that required hospitalization: Unknown Has patient had a PCN reaction occurring within the last 10 years: Unknown If all of the above answers are "NO", then may proceed with Cephalosporin use.   . Latex   . Lisinopril   . Mometasone Furoate     DISCHARGE MEDICATIONS:   Allergies as of 01/09/2019      Reactions   Amoxicillin    Has patient had a PCN reaction causing immediate rash, facial/tongue/throat swelling, SOB or lightheadedness with hypotension: Unknown Has patient had a PCN reaction causing severe rash involving mucus  membranes or skin necrosis: Unknown Has patient had a PCN reaction  that required hospitalization: Unknown Has patient had a PCN reaction occurring within the last 10 years: Unknown If all of the above answers are "NO", then may proceed with Cephalosporin use.   Latex    Lisinopril    Mometasone Furoate       Medication List    TAKE these medications   acetaminophen 500 MG tablet Commonly known as: TYLENOL Take 1,000 mg by mouth 2 (two) times daily.   aspirin EC 81 MG tablet Take 81 mg by mouth daily.   Calcium 600-D 600-400 MG-UNIT Tabs Generic drug: Calcium Carbonate-Vitamin D3 Take 1 tablet by mouth 2 (two) times daily.   escitalopram 10 MG tablet Commonly known as: LEXAPRO Take 10 mg by mouth daily.   feeding supplement (ENSURE ENLIVE) Liqd Take 237 mLs by mouth 2 (two) times daily between meals.   Ferrous Sulfate 142 (45 Fe) MG Tbcr Take 45 mg by mouth daily.   furosemide 20 MG tablet Commonly known as: LASIX Take 1 tablet (20 mg total) by mouth every other day. What changed: when to take this   lamoTRIgine 25 MG tablet Commonly known as: LAMICTAL Take 50 mg by mouth at bedtime.   lamoTRIgine 25 MG tablet Commonly known as: LAMICTAL Take 37.5 mg by mouth daily.   levothyroxine 75 MCG tablet Commonly known as: SYNTHROID Take 75 mcg by mouth daily.   metoprolol succinate 25 MG 24 hr tablet Commonly known as: TOPROL-XL Take 1 tablet (25 mg total) by mouth daily.   multivitamin with minerals Tabs tablet Take 1 tablet by mouth daily.   neomycin-bacitracin-polymyxin Oint Commonly known as: NEOSPORIN Apply 1 application topically daily. (apply to left leg)   pantoprazole 40 MG tablet Commonly known as: PROTONIX Take 40 mg by mouth 2 (two) times daily.   PEG 3350 17 g Pack Take 17 g by mouth daily as needed (moderate constipation).   senna-docusate 8.6-50 MG tablet Commonly known as: Senokot-S Take 2 tablets by mouth at bedtime.   simvastatin 40 MG tablet Commonly known as: ZOCOR Take 40 mg by mouth at bedtime.    traZODone 50 MG tablet Commonly known as: DESYREL Take 2 tablets (100 mg total) by mouth at bedtime as needed for sleep. What changed:   medication strength  when to take this  reasons to take this   white petrolatum Gel Commonly known as: VASELINE Apply 1 application topically 3 (three) times daily. Apply to lips       If you experience worsening of your admission symptoms, develop shortness of breath, life threatening emergency, suicidal or homicidal thoughts you must seek medical attention immediately by calling 911 or calling your MD immediately  if symptoms less severe.  You Must read complete instructions/literature along with all the possible adverse reactions/side effects for all the Medicines you take and that have been prescribed to you. Take any new Medicines after you have completely understood and accept all the possible adverse reactions/side effects.   Please note  You were cared for by a hospitalist during your hospital stay. If you have any questions about your discharge medications or the care you received while you were in the hospital after you are discharged, you can call the unit and asked to speak with the hospitalist on call if the hospitalist that took care of you is not available. Once you are discharged, your primary care physician will handle any further medical issues. Please note that  NO REFILLS for any discharge medications will be authorized once you are discharged, as it is imperative that you return to your primary care physician (or establish a relationship with a primary care physician if you do not have one) for your aftercare needs so that they can reassess your need for medications and monitor your lab values. Today   SUBJECTIVE   Awake alert so far having a good day. No new issues per RN  VITAL SIGNS:  Blood pressure (!) 145/78, pulse 81, temperature 97.7 F (36.5 C), temperature source Oral, resp. rate 18, height  (1.626 m), weight 86.7  kg, SpO2 93 %.  I/O:    Intake/Output Summary (Last 24 hours) at 01/09/2019 1057 Last data filed at 01/09/2019 0500 Gross per 24 hour  Intake 60 ml  Output 700 ml  Net -640 ml    PHYSICAL EXAMINATION:  GENERAL:  85 y.o.-year-old patient lying in the bed with no acute distress.  EYES: Pupils equal, round, reactive to light and accommodation. No scleral icterus. Extraocular muscles intact.  HEENT: Head atraumatic, normocephalic. Oropharynx and nasopharynx clear.  NECK:  Supple, no jugular venous distention. No thyroid enlargement, no tenderness.  LUNGS: Normal breath sounds bilaterally, no wheezing, rales,rhonchi or crepitation. No use of accessory muscles of respiration.  CARDIOVASCULAR: S1, S2 normal. No murmurs, rubs, or gallops.  ABDOMEN: Soft, non-tender, non-distended. Bowel sounds present. No organomegaly or mass.  EXTREMITIES: chronic left tibial chilled superficial ulcer present on admission NEUROLOGIC: Cranial nerves II through XII are intact. Muscle strength 5/5 in all extremities. Sensation intact. Gait not checked.  PSYCHIATRIC: The patient is alert and oriented x 2, at baseline has dementia SKIN: as above  DATA REVIEW:   CBC  Recent Labs  Lab 01/04/19 0613  WBC 5.7  HGB 9.5*  HCT 29.6*  PLT 161    Chemistries  Recent Labs  Lab 01/04/19 0613  NA 143  K 3.5  CL 105  CO2 25  GLUCOSE 63*  BUN 24*  CREATININE 1.31*  CALCIUM 8.4*  MG 2.3  AST 63*  ALT 17  ALKPHOS 62  BILITOT 0.5    Microbiology Results   Recent Results (from the past 240 hour(s))  Blood culture (routine x 2)     Status: None   Collection Time: 01/03/19  5:36 PM   Specimen: BLOOD  Result Value Ref Range Status   Specimen Description   Final    BLOOD Blood Culture results may not be optimal due to an inadequate volume of blood received in culture bottles   Special Requests   Final    BOTTLES DRAWN AEROBIC AND ANAEROBIC RIGHT ANTECUBITAL   Culture   Final    NO GROWTH 5  DAYS Performed at [facility], [address], [place], [place] [address]    Report Status 01/08/2019 FINAL  Final  Urine culture     Status: Abnormal   Collection Time: 01/03/19  6:13 PM   Specimen: Urine, Random  Result Value Ref Range Status   Specimen Description   Final    URINE, RANDOM Performed at [facility], [address], [place], [place] [address]    Special Requests   Final    NONE Performed at [facility], [address], [place], [place] [address]    Culture (A)  Final    <10,000 COLONIES/mL INSIGNIFICANT GROWTH Performed at [facility], [address] [place], [place] [address]    Report Status 01/04/2019 FINAL  Final  SARS CORONAVIRUS 2 (TAT 6-24 HRS) Nasopharyngeal Nasopharyngeal Swab     Status: None   Collection Time: 01/03/19  6:13 PM   Specimen: Nasopharyngeal Swab  Result Value Ref Range Status   SARS Coronavirus 2 NEGATIVE NEGATIVE Final    Comment: (NOTE) SARS-CoV-2 target nucleic acids are NOT DETECTED. The SARS-CoV-2 RNA is generally detectable in upper and lower respiratory specimens during the acute phase of infection. Negative results do not preclude SARS-CoV-2 infection, do not rule out co-infections with other pathogens, and should not be used as the sole basis for treatment or other patient management decisions. Negative results must be combined with clinical observations, patient history, and epidemiological information. The expected result is Negative. Fact Sheet for Patients: SugarRoll.be Fact Sheet for Healthcare Providers: https://www.woods-mathews.com/ This test is not yet approved or cleared by the [place] FDA and  has been authorized for detection and/or diagnosis of SARS-CoV-2 by FDA under an Emergency Use Authorization (EUA). This EUA will remain  in effect (meaning this test can be used) for the duration of the COVID-19 declaration under  Section 56 4(b)(1) of the Act, 21 U.S.C. section 360bbb-3(b)(1), unless the authorization is terminated or revoked sooner. Performed at [facility], [place] [address] [place], [place] [address]   Blood culture (routine x 2)     Status: None   Collection Time: 01/03/19  8:29 PM   Specimen: BLOOD  Result Value Ref Range Status   Specimen Description   Final    BLOOD Blood Culture results may not be optimal due to an excessive volume of blood received in culture bottles   Special Requests   Final    BOTTLES DRAWN AEROBIC AND ANAEROBIC LEFT ANTECUBITAL   Culture   Final    NO GROWTH 5 DAYS Performed at [facility], [address] [place], [place] [address]    Report Status 01/08/2019 FINAL  Final  SARS CORONAVIRUS 2 (TAT 6-24 HRS) Nasopharyngeal Nasopharyngeal Swab     Status: None   Collection Time: 01/06/19  3:20 PM   Specimen: Nasopharyngeal Swab  Result Value Ref Range Status   SARS Coronavirus 2 NEGATIVE NEGATIVE Final    Comment: (NOTE) SARS-CoV-2 target nucleic acids are NOT DETECTED. The SARS-CoV-2 RNA is generally detectable in upper and lower respiratory specimens during the acute phase of infection. Negative results do not preclude SARS-CoV-2 infection, do not rule out co-infections with other pathogens, and should not be used as the sole basis for treatment or other patient management decisions. Negative results must be combined with clinical observations, patient history, and epidemiological information. The expected result is Negative. Fact Sheet for Patients: SugarRoll.be Fact Sheet for Healthcare Providers: https://www.woods-mathews.com/ This test is not yet approved or cleared by the [place] FDA and  has been authorized for detection and/or diagnosis of SARS-CoV-2 by FDA under an Emergency Use Authorization (EUA). This EUA will remain  in effect (meaning this test can be used) for the duration of  the COVID-19 declaration under Section 56 4(b)(1) of the Act, 21 U.S.C. section 360bbb-3(b)(1), unless the authorization is terminated or revoked sooner. Performed at [facility], [place] [address] [place], [place] [address]     RADIOLOGY:  No results found.   CODE STATUS:     Code Status Orders  (From admission, onward)         Start     Ordered   01/03/19 2256  Do not attempt resuscitation (DNR)  Continuous  Question Answer Comment  In the event of cardiac or respiratory ARREST Do not call a "code blue"   In the event of cardiac or respiratory ARREST Do not perform Intubation, CPR, defibrillation or ACLS   In the event of cardiac or respiratory ARREST Use medication by any route, position, wound care, and other measures to relive pain and suffering. May use oxygen, suction and manual treatment of airway obstruction as needed for comfort.      01/03/19 2255        Code Status History    Date Active Date Inactive Code Status Order ID Comments User Context   11/10/2018 2311 11/15/2018 2359 DNR [id number]  Jimmye Norman, NP ED   09/01/2018 0231 09/01/2018 2045 DNR [id number]  Donato Heinz, MD Inpatient   02/01/2018 2107 02/03/2018 2129 DNR [id number]  Ihor Austin, MD Inpatient   09/17/2017 2300 09/19/2017 1904 DNR [id number]  Cammy Copa, MD Inpatient   08/07/2017 1553 08/08/2017 1938 DNR [id number]  Milagros Loll, MD ED   08/07/2017 1528 08/07/2017 1553 DNR [id number]  Willy Eddy, MD ED   Advance Care Planning Activity    Advance Directive Documentation     Most Recent Value  Type of Advance Directive  Healthcare Power of Attorney, Out of facility DNR (pink MOST or yellow form)  Pre-existing out of facility DNR order (yellow form or pink MOST form)  -  "MOST" Form in Place?  -     Family Discussion: Consults:   TOTAL TIME TAKING CARE OF THIS PATIENT: **40* minutes.    Enedina Finner M.D on 01/09/2019 at 10:57 AM  Between 7am to 6pm - Pager -  [phone] After 6pm go to [email or web address] - password [email or web address]  [facility]    CC: Primary care physician; [facility]

## 2019-01-09 NOTE — Progress Notes (Signed)
Report called to [place], Med Tech at [facility].  EMS notified, awaiting transport.

## 2019-01-09 NOTE — TOC Transition Note (Addendum)
Transition of Care [facility]) - CM/SW Discharge Note   Patient Details  Name: Megan Donaldson MRN: [id number] Date of Birth: [birth year]  [facility] CM/SW Contact:  Wende Neighbors, LCSW Phone Number: 01/09/2019, 11:48 AM   Clinical Narrative:   CSW spoke with patients daughter via phone about discharge  back to [facility]. Daughter agreeable to discharge plan. RN to please call [phone] and ask for med tech  for report.     Final next level of care: Assisted Living Barriers to Discharge: No Barriers Identified   Patient Goals and CMS Choice        Discharge Placement              Patient [facility] bed at: ([facility]) Patient to be transferred to facility by: ems Name of family member notified: spoke with patients daughter via phone Patient and family notified of of transfer: 01/09/19  Discharge Plan and Services                          HH Arranged: OT, PT, RN [facility] Agency: [place] Date [place]: 01/07/19 Time [place]: [place] Representative spoke with at [place]: Malachy Mood ROse  Social Determinants of Health ([place]) Interventions     Readmission Risk Interventions Readmission Risk Prevention Plan 01/05/2019 11/13/2018  Medication Screening - Complete  Transportation Screening Complete Complete  HRI or Home Care Consult Complete -  Medication Review (RN Care Manager) Complete -  Some recent data might be hidden

## 2019-01-09 NOTE — Care Management Important Message (Signed)
Important Message  Patient Details  Name: Megan Donaldson MRN: [id number] Date of Birth: 1928-01-02   Medicare Important Message Given:  Yes     Dannette Barbara 01/09/2019, 10:46 AM

## 2019-01-18 ENCOUNTER — Encounter: Payer: Self-pay | Admitting: Emergency Medicine

## 2019-01-18 ENCOUNTER — Other Ambulatory Visit: Payer: Self-pay

## 2019-01-18 ENCOUNTER — Emergency Department
Admission: EM | Admit: 2019-01-18 | Discharge: 2019-01-18 | Disposition: A | Discharge status: Home / Self Care | Payer: Medicare Other | Attending: Emergency Medicine | Admitting: Emergency Medicine

## 2019-01-18 DIAGNOSIS — Y92121 Bathroom in nursing home as the place of occurrence of the external cause: Secondary | ICD-10-CM | POA: Insufficient documentation

## 2019-01-18 DIAGNOSIS — I110 Hypertensive heart disease with heart failure: Secondary | ICD-10-CM | POA: Diagnosis not present

## 2019-01-18 DIAGNOSIS — E039 Hypothyroidism, unspecified: Secondary | ICD-10-CM | POA: Insufficient documentation

## 2019-01-18 DIAGNOSIS — Z91040 Latex allergy status: Secondary | ICD-10-CM | POA: Diagnosis not present

## 2019-01-18 DIAGNOSIS — I509 Heart failure, unspecified: Secondary | ICD-10-CM | POA: Diagnosis not present

## 2019-01-18 DIAGNOSIS — F0390 Unspecified dementia without behavioral disturbance: Secondary | ICD-10-CM | POA: Insufficient documentation

## 2019-01-18 DIAGNOSIS — Z043 Encounter for examination and observation following other accident: Secondary | ICD-10-CM | POA: Insufficient documentation

## 2019-01-18 DIAGNOSIS — Y92009 Unspecified place in unspecified non-institutional (private) residence as the place of occurrence of the external cause: Secondary | ICD-10-CM

## 2019-01-18 DIAGNOSIS — Y999 Unspecified external cause status: Secondary | ICD-10-CM | POA: Insufficient documentation

## 2019-01-18 DIAGNOSIS — Z7982 Long term (current) use of aspirin: Secondary | ICD-10-CM | POA: Diagnosis not present

## 2019-01-18 DIAGNOSIS — Z79899 Other long term (current) drug therapy: Secondary | ICD-10-CM | POA: Diagnosis not present

## 2019-01-18 DIAGNOSIS — W19XXXA Unspecified fall, initial encounter: Secondary | ICD-10-CM

## 2019-01-18 DIAGNOSIS — W1811XA Fall from or off toilet without subsequent striking against object, initial encounter: Secondary | ICD-10-CM | POA: Diagnosis not present

## 2019-01-18 DIAGNOSIS — Z96641 Presence of right artificial hip joint: Secondary | ICD-10-CM | POA: Diagnosis not present

## 2019-01-18 DIAGNOSIS — Y9389 Activity, other specified: Secondary | ICD-10-CM | POA: Diagnosis not present

## 2019-01-18 NOTE — ED Notes (Addendum)
Patient pleasantly confused. HX dementia and unable to explain why she is here at hospital. This RN called and spoke with staff at [facility]. Staff reports they walked into her room this AM and found her on the bathroom floor after trying to get to the toilet without calling out for help. Patient is able to move all extremities in room with no problem.

## 2019-01-18 NOTE — ED Triage Notes (Addendum)
Pt to triage via w/c with no distress noted, mask in place; brought in by EMS from [facility]; st toilet toppled over on her earlier causing her to fall; pt denies any c/o or injuries; pt has a gauze dressing to left lower leg but st that is not from her fall

## 2019-01-18 NOTE — ED Provider Notes (Signed)
[facility] Emergency Department Provider Note   ____________________________________________   First MD Initiated Contact with Patient 01/18/19 [id number]     (approximate)  I have reviewed the triage vital signs and the nursing notes.   HISTORY: Limited by dementia.  Chief Complaint Fall    HPI Megan Donaldson is a 85 y.o. female patient [facility].  Patient's states no complaints.  History we have states that I told her topple over her early caused her to fall.  No injuries.  Patient states she has a history of falling.  Patient states she does not know why she was sent to the emergency room since nothing is wrong with her today.  Spoke with nursing home and they state the patient normally ambulates with a walker.  Patient went to the bathroom and fell off the toilet.  There was no LOC and patient denies head injury.         Past Medical History:  Diagnosis Date  . CHF (congestive heart failure) ([place])   . Dementia ([place])   . GERD (gastroesophageal reflux disease)   . Hyperlipidemia   . Hypertension   . Osteoporosis   . Personal history of other venous thrombosis and embolism   . Personal history of PE (pulmonary embolism)     Patient Active Problem List   Diagnosis Date Noted  . Constipation   . Dementia without behavioral disturbance ([place])   . Conjunctivitis 01/04/2019  . Acute encephalopathy 01/04/2019  . Acute metabolic encephalopathy 77/41/2878  . Sepsis ([place]) 01/03/2019  . Acute lower UTI 01/03/2019  . Hypotension 01/03/2019  . Multiple open wounds of lower leg 01/03/2019  . Prolonged QT interval 01/03/2019  . Bilateral lower leg cellulitis 11/10/2018  . Acute gastric ulcer with hemorrhage 03/10/2018  . GI bleed 02/01/2018  . Altered mental status 09/17/2017  . Seizure ([place]) 08/07/2017  . 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 Gastroesophageal reflux disease 10/22/2015  . Mild depression ([place]) 10/22/2015  .  H/O: GI bleed 07/30/2015  . History of deep venous thrombosis 07/30/2015  . History of pulmonary embolism 07/30/2015  . 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 S/P closed reduction of dislocated total hip prosthesis 03/19/2015  . Essential hypertension 03/23/2014  . Hyperlipidemia 03/23/2014  . Hypothyroidism 03/23/2014    Past Surgical History:  Procedure Laterality Date  . ESOPHAGOGASTRODUODENOSCOPY N/A 02/02/2018   Procedure: ESOPHAGOGASTRODUODENOSCOPY (EGD);  Surgeon: Toledo, Benay Pike, MD;  Location: [facility] ENDOSCOPY;  Service: Gastroenterology;  Laterality: N/A;  . HIP CLOSED REDUCTION Right 12/21/2014   Procedure: CLOSED REDUCTION HIP;  Surgeon: Earnestine Leys, MD;  Location: [facility] ORS;  Service: Orthopedics;  Laterality: Right;  . HIP CLOSED REDUCTION Right 09/01/2018   Procedure: CLOSED REDUCTION HIP;  Surgeon: Dereck Leep, MD;  Location: [facility] ORS;  Service: Orthopedics;  Laterality: Right;  . TOTAL HIP ARTHROPLASTY Right    x3    Prior to Admission medications   Medication Sig Start Date End Date Taking? Authorizing Provider  acetaminophen (TYLENOL) 500 MG tablet Take 1,000 mg by mouth 2 (two) times daily.     [provider]  aspirin EC 81 MG tablet Take 81 mg by mouth daily.    [provider]  Calcium Carbonate-Vitamin D3 (CALCIUM 600-D) 600-400 MG-UNIT TABS Take 1 tablet by mouth 2 (two) times daily.    [provider]  escitalopram (LEXAPRO) 10 MG tablet Take 10 mg by mouth daily. 07/21/17   [provider]  feeding supplement, ENSURE ENLIVE, (ENSURE ENLIVE) LIQD Take 237 mLs by mouth 2 (two) times daily between meals. 01/07/19   Enedina Finner, MD  Ferrous Sulfate 142 (45 Fe) MG TBCR Take 45 mg by mouth daily.    [provider]  furosemide (LASIX) 20 MG tablet Take 1 tablet (20 mg total) by mouth every other day. 01/07/19   Enedina Finner, MD  lamoTRIgine (LAMICTAL) 25 MG tablet Take 37.5 mg by mouth daily.  01/17/18  01/17/19  [provider]  lamoTRIgine (LAMICTAL) 25 MG tablet Take 50 mg by mouth at bedtime.    [provider]  levothyroxine (SYNTHROID, LEVOTHROID) 75 MCG tablet Take 75 mcg by mouth daily.    [provider]  metoprolol succinate (TOPROL-XL) 25 MG 24 hr tablet Take 1 tablet (25 mg total) by mouth daily. 09/20/17   Alford Highland, MD  Multiple Vitamin (MULTIVITAMIN WITH MINERALS) TABS tablet Take 1 tablet by mouth daily. 01/08/19   Enedina Finner, MD  neomycin-bacitracin-polymyxin (NEOSPORIN) OINT Apply 1 application topically daily. (apply to left leg)    [provider]  pantoprazole (PROTONIX) 40 MG tablet Take 40 mg by mouth 2 (two) times daily.    [provider]  Polyethylene Glycol 3350 (PEG 3350) 17 g PACK Take 17 g by mouth daily as needed (moderate constipation).     [provider]  senna-docusate (SENOKOT-S) 8.6-50 MG tablet Take 2 tablets by mouth at bedtime.    [provider]  simvastatin (ZOCOR) 40 MG tablet Take 40 mg by mouth at bedtime. 05/04/17   [provider]  traZODone (DESYREL) 50 MG tablet Take 2 tablets (100 mg total) by mouth at bedtime as needed for sleep. 01/07/19   Enedina Finner, MD  white petrolatum (VASELINE) GEL Apply 1 application topically 3 (three) times daily. Apply to lips    [provider]    Allergies Amoxicillin, Latex, Lisinopril, and Mometasone furoate  Family History  Problem Relation Age of Onset  . CAD Father     Social History Social History   Tobacco Use  . Smoking status: Never Smoker  . Smokeless tobacco: Never Used  Substance Use Topics  . Alcohol use: No  . Drug use: No    Review of Systems  Constitutional: No fever/chills Eyes: No visual changes. ENT: No sore throat. Cardiovascular: Denies chest pain. Respiratory: Denies shortness of breath. Gastrointestinal: No abdominal pain.  No nausea, no vomiting.  No diarrhea.  No constipation.  Genitourinary: Negative for dysuria. Musculoskeletal: Negative for back pain. Skin: Negative for rash. Neurological: Negative for headaches, focal weakness or numbness. Psychiatric:  Dementia Endocrine:  Hyperlipidemia and hypertension Allergic/Immunilogical: Amoxicillin, latex, and lisinopril.  ____________________________________________   PHYSICAL EXAM:  VITAL SIGNS: ED Triage Vitals  Enc Vitals Group     BP 01/18/19 0630 120/67     Pulse Rate 01/18/19 0630 (!) 56     Resp 01/18/19 0630 18     Temp --      Temp Source 01/18/19 0630 Oral     SpO2 01/18/19 0630 95 %     Weight 01/18/19 0629 191 lb 2.2 oz (86.7 kg)     Height 01/18/19 0629 5\' 4"  (1.626 m)     Head Circumference --      Peak Flow --      Pain Score 01/18/19 0628 0     Pain Loc --      Pain Edu? --      Excl. in  GC? --    Constitutional: Alert and oriented. Well appearing and in no acute distress. Eyes: Conjunctivae are normal. PERRL. EOMI. Head: Atraumatic. Nose: No congestion/rhinnorhea. Mouth/Throat: Mucous membranes are moist.  Oropharynx non-erythematous. Neck: No stridor. No cervical spine tenderness to palpation. Hematological/Lymphatic/Immunilogical: No cervical lymphadenopathy. Cardiovascular: Bradycardic, regular rhythm. Grossly normal heart sounds.  Good peripheral circulation. Respiratory: Normal respiratory effort.  No retractions. Lungs CTAB. Gastrointestinal: Soft and nontender. No distention. No abdominal bruits. No CVA tenderness. Genitourinary: Deferred Musculoskeletal: No lower extremity tenderness nor edema.  No joint effusions.  Bandaged left lower leg secondary to fall 2 days ago. Neurologic:  Normal speech and language. No gross focal neurologic deficits are appreciated. No gait instability. Skin:  Skin is warm, dry and intact. No rash noted. Psychiatric: Mood and affect are normal. Speech and behavior are normal.  ____________________________________________   LABS (all labs  ordered are listed, but only abnormal results are displayed)  Labs Reviewed - No data to display ____________________________________________  EKG   ____________________________________________  RADIOLOGY  ED MD interpretation:    Official radiology report(s): No results found.  ____________________________________________   PROCEDURES  Procedure(s) performed (including Critical Care):  Procedures   ____________________________________________   INITIAL IMPRESSION / ASSESSMENT AND PLAN / ED COURSE  As part of my medical decision making, I reviewed the following data within the electronic MEDICAL RECORD NUMBER     Presents to ED complaint fall.  History limited by patient dementia.  Physical exam was unremarkable except for wound to the left lower leg from previous fall.  Notified nursing home with no acute findings patient be transported back to the facility.    Megan Donaldson was evaluated in Emergency Department on 01/18/2019 for the symptoms described in the history of present illness. She was evaluated in the context of the global COVID-19 pandemic, which necessitated consideration that the patient might be at risk for infection with the SARS-CoV-2 virus that causes COVID-19. Institutional protocols and algorithms that pertain to the evaluation of patients at risk for COVID-19 are in a state of rapid change based on information released by regulatory bodies including the CDC and federal and state organizations. These policies and algorithms were followed during the patient's care in the ED.       ____________________________________________   FINAL CLINICAL IMPRESSION(S) / ED DIAGNOSES  Final diagnoses:  Fall in home, initial encounter     ED Discharge Orders    None       Note:  This document was prepared using Dragon voice recognition software and may include unintentional dictation errors.    Joni ReiningSmith, Ronald K, PA-C 01/18/19 [id number]    Emily FilbertWilliams, Jonathan  E, MD 01/18/19 [phone]

## 2019-01-18 NOTE — ED Notes (Signed)
Unable to get through to caregivers at [facility] assisted living X2. Spoke with brittany at [facility] who reports to send patient back by EMS at this time due to no transportation available at their facility at this time

## 2019-01-18 NOTE — ED Notes (Signed)
Patient assisted into bed. Placed in hospital gown due soiled/dirty personal dress

## 2019-01-18 NOTE — ED Notes (Signed)
Called [facility] for transport to [facility] [phone]

## 2019-01-26 ENCOUNTER — Emergency Department: Payer: Medicare Other

## 2019-01-26 ENCOUNTER — Emergency Department
Admission: EM | Admit: 2019-01-26 | Discharge: 2019-01-26 | Disposition: A | Discharge status: Home / Self Care | Payer: Medicare Other | Attending: Emergency Medicine | Admitting: Emergency Medicine

## 2019-01-26 ENCOUNTER — Other Ambulatory Visit: Payer: Self-pay

## 2019-01-26 DIAGNOSIS — L03115 Cellulitis of right lower limb: Secondary | ICD-10-CM

## 2019-01-26 DIAGNOSIS — Z20828 Contact with and (suspected) exposure to other viral communicable diseases: Secondary | ICD-10-CM | POA: Diagnosis not present

## 2019-01-26 DIAGNOSIS — I110 Hypertensive heart disease with heart failure: Secondary | ICD-10-CM | POA: Diagnosis not present

## 2019-01-26 DIAGNOSIS — I509 Heart failure, unspecified: Secondary | ICD-10-CM | POA: Diagnosis not present

## 2019-01-26 DIAGNOSIS — Z7982 Long term (current) use of aspirin: Secondary | ICD-10-CM | POA: Insufficient documentation

## 2019-01-26 DIAGNOSIS — E039 Hypothyroidism, unspecified: Secondary | ICD-10-CM | POA: Diagnosis not present

## 2019-01-26 DIAGNOSIS — Z79899 Other long term (current) drug therapy: Secondary | ICD-10-CM | POA: Insufficient documentation

## 2019-01-26 DIAGNOSIS — Z91040 Latex allergy status: Secondary | ICD-10-CM | POA: Insufficient documentation

## 2019-01-26 DIAGNOSIS — I959 Hypotension, unspecified: Secondary | ICD-10-CM | POA: Diagnosis present

## 2019-01-26 DIAGNOSIS — F0390 Unspecified dementia without behavioral disturbance: Secondary | ICD-10-CM | POA: Diagnosis not present

## 2019-01-26 LAB — CBC WITH DIFFERENTIAL/PLATELET
Abs Immature Granulocytes: 0.02 10*3/uL (ref 0.00–0.07)
Basophils Absolute: 0.1 10*3/uL (ref 0.0–0.1)
Basophils Relative: 1 %
Eosinophils Absolute: 0.1 10*3/uL (ref 0.0–0.5)
Eosinophils Relative: 1 %
HCT: 32.4 % — ABNORMAL LOW (ref 36.0–46.0)
Hemoglobin: 10.3 g/dL — ABNORMAL LOW (ref 12.0–15.0)
Immature Granulocytes: 0 %
Lymphocytes Relative: 42 %
Lymphs Abs: 2.5 10*3/uL (ref 0.7–4.0)
MCH: 28.4 pg (ref 26.0–34.0)
MCHC: 31.8 g/dL (ref 30.0–36.0)
MCV: 89.3 fL (ref 80.0–100.0)
Monocytes Absolute: 0.8 10*3/uL (ref 0.1–1.0)
Monocytes Relative: 13 %
Neutro Abs: 2.6 10*3/uL (ref 1.7–7.7)
Neutrophils Relative %: 43 %
Platelets: 229 10*3/uL (ref 150–400)
RBC: 3.63 MIL/uL — ABNORMAL LOW (ref 3.87–5.11)
RDW: 18.5 % — ABNORMAL HIGH (ref 11.5–15.5)
WBC: 6 10*3/uL (ref 4.0–10.5)
nRBC: 0 % (ref 0.0–0.2)

## 2019-01-26 LAB — COMPREHENSIVE METABOLIC PANEL
ALT: 15 U/L (ref 0–44)
AST: 25 U/L (ref 15–41)
Albumin: 3.6 g/dL (ref 3.5–5.0)
Alkaline Phosphatase: 74 U/L (ref 38–126)
Anion gap: 9 (ref 5–15)
BUN: 40 mg/dL — ABNORMAL HIGH (ref 8–23)
CO2: 27 mmol/L (ref 22–32)
Calcium: 9.4 mg/dL (ref 8.9–10.3)
Chloride: 106 mmol/L (ref 98–111)
Creatinine, Ser: 1.72 mg/dL — ABNORMAL HIGH (ref 0.44–1.00)
GFR calc Af Amer: 30 mL/min — ABNORMAL LOW (ref >60)
GFR calc non Af Amer: 26 mL/min — ABNORMAL LOW (ref >60)
Glucose, Bld: 86 mg/dL (ref 70–99)
Potassium: 4.2 mmol/L (ref 3.5–5.1)
Sodium: 142 mmol/L (ref 135–145)
Total Bilirubin: 0.4 mg/dL (ref 0.3–1.2)
Total Protein: 6.6 g/dL (ref 6.5–8.1)

## 2019-01-26 LAB — TROPONIN I (HIGH SENSITIVITY)
Troponin I (High Sensitivity): 11 ng/L (ref <18)
Troponin I (High Sensitivity): 11 ng/L (ref <18)

## 2019-01-26 MED ORDER — CLINDAMYCIN HCL 300 MG PO CAPS: 300.0000 mg | ORAL_CAPSULE | Freq: Three times a day (TID) | ORAL | 0 refills | Status: AC

## 2019-01-26 MED ORDER — CLINDAMYCIN HCL 300 MG PO CAPS: 300.0000 mg | ORAL_CAPSULE | Freq: Three times a day (TID) | ORAL | 0 refills | Status: DC

## 2019-01-26 MED ORDER — CLINDAMYCIN HCL 150 MG PO CAPS
300.0000 mg | ORAL_CAPSULE | Freq: Once | ORAL | Status: AC
  Administered 2019-01-26: 300 mg via ORAL
  Filled 2019-01-26: qty 2

## 2019-01-26 NOTE — ED Triage Notes (Signed)
Patient seen by her PA at [facility] today and was sent to ed fue to hypotension. Patient arrive aox4 with NAD, Denies pain/shortness of breath or any other concerns.

## 2019-01-26 NOTE — ED Notes (Signed)
Repeat trop drawn and sent

## 2019-01-26 NOTE — Discharge Instructions (Addendum)
Please return for increasing redness swelling on the right shin.  Please have your doctor check your blood pressure and your leg again tomorrow afternoon.  Please return here if you feel worse at all.  If you develop diarrhea stop the clindamycin to get rechecked right away.

## 2019-01-26 NOTE — ED Provider Notes (Signed)
[facility] Emergency Department Provider Note   ____________________________________________   First MD Initiated Contact with Patient 01/26/19 1510     (approximate)  I have reviewed the triage vital signs and the nursing notes.   HISTORY  Chief Complaint Hypotension    HPI Megan Donaldson is a 85 y.o. female patient had not been read she saw the PA today who got a blood pressure of 80/50.  Patient was sent here.  EMS reported that they took several blood pressures and there was never any below 100.  Here blood pressures been 116 up to 130s systolic.  Patient reports she feels well.  Nothing hurts nothing bothers her she is not coughing or short of breath she says she does not know why she came in.  Review of old records show she does have a history of CHF and GI bleed as well as PE etc.         Past Medical History:  Diagnosis Date  . CHF (congestive heart failure) ([facility])   . Dementia ([facility])   . GERD (gastroesophageal reflux disease)   . Hyperlipidemia   . Hypertension   . Osteoporosis   . Personal history of other venous thrombosis and embolism   . Personal history of PE (pulmonary embolism)     Patient Active Problem List   Diagnosis Date Noted  . Constipation   . Dementia without behavioral disturbance ([facility])   . Conjunctivitis 01/04/2019  . Acute encephalopathy 01/04/2019  . Acute metabolic encephalopathy 01/03/2019  . Sepsis ([facility]) 01/03/2019  . Acute lower UTI 01/03/2019  . Hypotension 01/03/2019  . Multiple open wounds of lower leg 01/03/2019  . Prolonged QT interval 01/03/2019  . Bilateral lower leg cellulitis 11/10/2018  . Acute gastric ulcer with hemorrhage 03/10/2018  . GI bleed 02/01/2018  . Altered mental status 09/17/2017  . Seizure ([facility]) 08/07/2017  . 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 Gastroesophageal reflux disease 10/22/2015  . Mild depression ([facility]) 10/22/2015  . H/O: GI bleed 07/30/2015  . History of deep  venous thrombosis 07/30/2015  . History of pulmonary embolism 07/30/2015  . 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 S/P closed reduction of dislocated total hip prosthesis 03/19/2015  . Essential hypertension 03/23/2014  . Hyperlipidemia 03/23/2014  . Hypothyroidism 03/23/2014    Past Surgical History:  Procedure Laterality Date  . ESOPHAGOGASTRODUODENOSCOPY N/A 02/02/2018   Procedure: ESOPHAGOGASTRODUODENOSCOPY (EGD);  Surgeon: Toledo, Boykin Nearingeodoro K, MD;  Location: [facility] ENDOSCOPY;  Service: Gastroenterology;  Laterality: N/A;  . HIP CLOSED REDUCTION Right 12/21/2014   Procedure: CLOSED REDUCTION HIP;  Surgeon: Deeann SaintHoward Miller, MD;  Location: [facility] ORS;  Service: Orthopedics;  Laterality: Right;  . HIP CLOSED REDUCTION Right 09/01/2018   Procedure: CLOSED REDUCTION HIP;  Surgeon: Donato HeinzHooten, James P, MD;  Location: [facility] ORS;  Service: Orthopedics;  Laterality: Right;  . TOTAL HIP ARTHROPLASTY Right    x3    Prior to Admission medications   Medication Sig Start Date End Date Taking? Authorizing Provider  acetaminophen (TYLENOL) 500 MG tablet Take 1,000 mg by mouth 2 (two) times daily.     [provider]  aspirin EC 81 MG tablet Take 81 mg by mouth daily.    [provider]  Calcium Carbonate-Vitamin D3 (CALCIUM 600-D) 600-400 MG-UNIT TABS Take 1 tablet by mouth 2 (two) times daily.    [provider]  escitalopram (LEXAPRO) 10 MG tablet Take 10 mg by mouth daily. 07/21/17   [provider]  feeding supplement, ENSURE ENLIVE, (ENSURE ENLIVE) LIQD Take 237 mLs by mouth 2 (two) times daily between meals. 01/07/19   Megan Finner, MD  Ferrous Sulfate 142 (45 Fe) MG TBCR Take 45 mg by mouth daily.    [provider]  furosemide (LASIX) 20 MG tablet Take 1 tablet (20 mg total) by mouth every other day. 01/07/19   Megan Finner, MD  lamoTRIgine (LAMICTAL) 25 MG tablet Take 37.5 mg by mouth daily.  01/17/18 01/17/19  [provider]    lamoTRIgine (LAMICTAL) 25 MG tablet Take 50 mg by mouth at bedtime.    [provider]  levothyroxine (SYNTHROID, LEVOTHROID) 75 MCG tablet Take 75 mcg by mouth daily.    [provider]  metoprolol succinate (TOPROL-XL) 25 MG 24 hr tablet Take 1 tablet (25 mg total) by mouth daily. 09/20/17   Alford Highland, MD  Multiple Vitamin (MULTIVITAMIN WITH MINERALS) TABS tablet Take 1 tablet by mouth daily. 01/08/19   Megan Finner, MD  neomycin-bacitracin-polymyxin (NEOSPORIN) OINT Apply 1 application topically daily. (apply to left leg)    [provider]  pantoprazole (PROTONIX) 40 MG tablet Take 40 mg by mouth 2 (two) times daily.    [provider]  Polyethylene Glycol 3350 (PEG 3350) 17 g PACK Take 17 g by mouth daily as needed (moderate constipation).     [provider]  senna-docusate (SENOKOT-S) 8.6-50 MG tablet Take 2 tablets by mouth at bedtime.    [provider]  simvastatin (ZOCOR) 40 MG tablet Take 40 mg by mouth at bedtime. 05/04/17   [provider]  traZODone (DESYREL) 50 MG tablet Take 2 tablets (100 mg total) by mouth at bedtime as needed for sleep. 01/07/19   Megan Finner, MD  white petrolatum (VASELINE) GEL Apply 1 application topically 3 (three) times daily. Apply to lips    [provider]    Allergies Amoxicillin, Latex, Lisinopril, and Mometasone furoate  Family History  Problem Relation Age of Onset  . CAD Father     Social History Social History   Tobacco Use  . Smoking status: Never Smoker  . Smokeless tobacco: Never Used  Substance Use Topics  . Alcohol use: No  . Drug use: No    Review of Systems  Constitutional: No fever/chills Eyes: No visual changes. ENT: No sore throat. Cardiovascular: Denies chest pain. Respiratory: Denies shortness of breath. Gastrointestinal: No abdominal pain.  No nausea, no vomiting.  No diarrhea.  No constipation. Genitourinary: Negative for  dysuria. Musculoskeletal: Negative for back pain. Skin: Negative for rash. Neurological: Negative for headaches, focal weakness   ____________________________________________   PHYSICAL EXAM:  VITAL SIGNS: ED Triage Vitals  Enc Vitals Group     BP 01/26/19 1456 116/64     Pulse Rate 01/26/19 1456 (!) 58     Resp 01/26/19 1456 17     Temp 01/26/19 1456 98.6 F (37 C)     Temp Source 01/26/19 1456 Oral     SpO2 01/26/19 1453 99 %     Weight 01/26/19 1459 178 lb (80.7 kg)     Height 01/26/19 1459 5\' 3"  (1.6 m)     Head Circumference --      Peak Flow --      Pain Score 01/26/19 1459 0     Pain Loc --      Pain Edu? --      Excl. in GC? --     Constitutional: Alert and oriented to person and hospital.  Well appearing and in no acute distress. Eyes: Conjunctivae are normal.  Head: Atraumatic. Nose: No congestion/rhinnorhea. Mouth/Throat: Mucous membranes are moist.  Oropharynx non-erythematous. Neck: No stridor Cardiovascular: Normal rate, regular rhythm. Grossly normal heart sounds.  Good peripheral circulation. Respiratory: Normal respiratory effort.  No retractions. Lungs CTAB. Gastrointestinal: Soft and nontender. No distention. No abdominal bruits. No CVA tenderness. Musculoskeletal: No lower extremity tenderness bilateral trace edema.   Neurologic:  Normal speech and language. No gross focal neurologic deficits are appreciated.  Skin:  Skin is warm, dry several scabs on the legs nursing removed from the left leg leg looks fairly good nice and pink no oozing areas seen the right leg has some larger scabs in the lower leg from about the mid tibia down the red hot swollen a little tender.  ____________________________________________   LABS (all labs ordered are listed, but only abnormal results are displayed)  Labs Reviewed  COMPREHENSIVE METABOLIC PANEL - Abnormal; Notable for the following components:      Result Value   BUN 40 (*)    Creatinine, Ser 1.72 (*)     GFR calc non Af Amer 26 (*)    GFR calc Af Amer 30 (*)    All other components within normal limits  CBC WITH DIFFERENTIAL/PLATELET - Abnormal; Notable for the following components:   RBC 3.63 (*)    Hemoglobin 10.3 (*)    HCT 32.4 (*)    RDW 18.5 (*)    All other components within normal limits  SARS CORONAVIRUS 2 (TAT 6-24 HRS)  URINALYSIS, COMPLETE (UACMP) WITH MICROSCOPIC  TROPONIN I (HIGH SENSITIVITY)  TROPONIN I (HIGH SENSITIVITY)   ____________________________________________  EKG EKG read interpreted by me shows sinus bradycardia rate of 49 normal axis no acute ST-T wave changes  ____________________________________________  RADIOLOGY  ED MD interpretation chest x-ray read by radiology reviewed by me shows no acute disease.  Official radiology report(s): DG Chest Portable 1 View  Result Date: 01/26/2019 CLINICAL DATA:  Hypotension EXAM: PORTABLE CHEST 1 VIEW COMPARISON:  Radiograph 01/03/2019 CT chest 04/21/2010 FINDINGS: Bandlike area of scarring and/or atelectasis in the right mid lung. Lungs are otherwise clear. No consolidation, features of edema, pneumothorax, or effusion. The aorta is calcified. The remaining cardiomediastinal contours are unremarkable. No acute osseous or soft tissue abnormality. Degenerative changes are present in the imaged spine and shoulders. Features are severe in the right shoulder. Rounded calcification in the left upper quadrant seen previously likely reflecting a large splenic artery aneurysm seen on prior CT. IMPRESSION: 1. Bandlike area of scarring and/or atelectasis in the right mid lung. 2. No acute findings in the chest. 3. 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 Electronically Signed   By: Kreg Shropshire M.D.   On: 01/26/2019 16:01    ____________________________________________   PROCEDURES  Procedure(s) performed (including Critical  Care):  Procedures   ____________________________________________   INITIAL IMPRESSION / ASSESSMENT AND PLAN / ED COURSE   Patient observed now for 5 hours.  She is afebrile has a normal white count.  BUN is up a little bit but her white count is normal.  She has not had any episodes of hypotension here she was briefly bradycardic but without symptoms and is no longer significantly bradycardic.  She does have what appears to be cellulitis on the right shin.  We will treat this with antibiotics.  We will let her go back home.  She will return for any further problems.  ____________________________________________   FINAL CLINICAL IMPRESSION(S) / ED DIAGNOSES  Final diagnoses:  Cellulitis of right lower extremity     ED Discharge Orders    None       Note:  This document was prepared using Dragon voice recognition software and may include unintentional dictation errors.    Nena Polio, MD 01/26/19 [place]

## 2019-01-26 NOTE — ED Notes (Signed)
  Lab called for COVID specimen tubes

## 2019-01-26 NOTE — ED Notes (Signed)
Updated daughter as to plan of care. Daughter states she will not come get pt to take her home. Attempted to call [facility], no answer.

## 2019-01-26 NOTE — ED Notes (Signed)
EMS called.  Talked to [facility].

## 2019-01-26 NOTE — ED Notes (Signed)
Lab called for specimen COVID tubes

## 2019-01-27 LAB — SARS CORONAVIRUS 2 (TAT 6-24 HRS): SARS Coronavirus 2: NEGATIVE

## 2019-02-04 ENCOUNTER — Other Ambulatory Visit: Payer: Self-pay

## 2019-02-04 ENCOUNTER — Emergency Department
Admission: EM | Admit: 2019-02-04 | Discharge: 2019-02-04 | Disposition: A | Discharge status: Home / Self Care | Payer: Medicare Other | Attending: Emergency Medicine | Admitting: Emergency Medicine

## 2019-02-04 DIAGNOSIS — F0390 Unspecified dementia without behavioral disturbance: Secondary | ICD-10-CM | POA: Diagnosis not present

## 2019-02-04 DIAGNOSIS — Z79899 Other long term (current) drug therapy: Secondary | ICD-10-CM | POA: Diagnosis not present

## 2019-02-04 DIAGNOSIS — Z91040 Latex allergy status: Secondary | ICD-10-CM | POA: Insufficient documentation

## 2019-02-04 DIAGNOSIS — I509 Heart failure, unspecified: Secondary | ICD-10-CM | POA: Insufficient documentation

## 2019-02-04 DIAGNOSIS — Z7982 Long term (current) use of aspirin: Secondary | ICD-10-CM | POA: Diagnosis not present

## 2019-02-04 DIAGNOSIS — Z96641 Presence of right artificial hip joint: Secondary | ICD-10-CM | POA: Insufficient documentation

## 2019-02-04 DIAGNOSIS — I110 Hypertensive heart disease with heart failure: Secondary | ICD-10-CM | POA: Insufficient documentation

## 2019-02-04 DIAGNOSIS — K921 Melena: Secondary | ICD-10-CM | POA: Diagnosis present

## 2019-02-04 DIAGNOSIS — E039 Hypothyroidism, unspecified: Secondary | ICD-10-CM | POA: Diagnosis not present

## 2019-02-04 DIAGNOSIS — R195 Other fecal abnormalities: Secondary | ICD-10-CM | POA: Insufficient documentation

## 2019-02-04 LAB — COMPREHENSIVE METABOLIC PANEL
ALT: 18 U/L (ref 0–44)
AST: 31 U/L (ref 15–41)
Albumin: 3.6 g/dL (ref 3.5–5.0)
Alkaline Phosphatase: 78 U/L (ref 38–126)
Anion gap: 11 (ref 5–15)
BUN: 32 mg/dL — ABNORMAL HIGH (ref 8–23)
CO2: 27 mmol/L (ref 22–32)
Calcium: 10 mg/dL (ref 8.9–10.3)
Chloride: 101 mmol/L (ref 98–111)
Creatinine, Ser: 1.87 mg/dL — ABNORMAL HIGH (ref 0.44–1.00)
GFR calc Af Amer: 27 mL/min — ABNORMAL LOW (ref >60)
GFR calc non Af Amer: 23 mL/min — ABNORMAL LOW (ref >60)
Glucose, Bld: 91 mg/dL (ref 70–99)
Potassium: 4.8 mmol/L (ref 3.5–5.1)
Sodium: 139 mmol/L (ref 135–145)
Total Bilirubin: 0.6 mg/dL (ref 0.3–1.2)
Total Protein: 6.3 g/dL — ABNORMAL LOW (ref 6.5–8.1)

## 2019-02-04 LAB — CBC
HCT: 29.3 % — ABNORMAL LOW (ref 36.0–46.0)
Hemoglobin: 9.6 g/dL — ABNORMAL LOW (ref 12.0–15.0)
MCH: 28.2 pg (ref 26.0–34.0)
MCHC: 32.8 g/dL (ref 30.0–36.0)
MCV: 86.2 fL (ref 80.0–100.0)
Platelets: 163 10*3/uL (ref 150–400)
RBC: 3.4 MIL/uL — ABNORMAL LOW (ref 3.87–5.11)
RDW: 18.6 % — ABNORMAL HIGH (ref 11.5–15.5)
WBC: 6.3 10*3/uL (ref 4.0–10.5)
nRBC: 0 % (ref 0.0–0.2)

## 2019-02-04 LAB — TYPE AND SCREEN
ABO/RH(D): A POS
Antibody Screen: NEGATIVE

## 2019-02-04 NOTE — Discharge Instructions (Signed)
Patient's stool is dark but is not Hemoccult positive.  She is slightly more anemic than she has been and possibly slightly more dehydrated than she has been.  Please keep an eye on her blood count and have her return if it drops more.  Currently she does not appear to need a transfusion.  Keep a close eye on her legs.  They are a little bit red.  We have redressed them.  Please have her follow-up with her doctor in the next couple days.  Feel free to send her back as needed.

## 2019-02-04 NOTE — ED Notes (Signed)
Daughter called and updated on patient. Aware pt is going back to [facility].

## 2019-02-04 NOTE — ED Notes (Signed)
Pt's legs rewrapped with xerophorm, abd pads, and gauze

## 2019-02-04 NOTE — ED Triage Notes (Signed)
Pt arrives via EMS from [facility] after having black tarry stools x2 today- per EMS pt was sent by her daughter- [facility]

## 2019-02-04 NOTE — ED Provider Notes (Signed)
[facility] Emergency Department Provider Note   ____________________________________________   First MD Initiated Contact with Patient 02/04/19 1717     (approximate)  I have reviewed the triage vital signs and the nursing notes.   HISTORY  Chief Complaint Melena   HPI Megan Donaldson is a 85 y.o. female patient sent for evaluation of melena.  Patient's rectal exam is Hemoccult negative although the stool is dark.  Patient reports she feels well.  She reports her legs occasionally will bleed a little bit and then stop.  She has them both dressed.  When the dressing is taken off legs are pink looking not angry red.  Her blood pressure is much better than it was last time we saw her.  She is slightly more anemic than last time which is barely.  Her GFR is down slightly.  She may be slightly dehydrated.  She feels well though and wants to go back.  I see no reason why she could not.         Past Medical History:  Diagnosis Date  . CHF (congestive heart failure) ([place])   . Dementia ([place])   . GERD (gastroesophageal reflux disease)   . Hyperlipidemia   . Hypertension   . Osteoporosis   . Personal history of other venous thrombosis and embolism   . Personal history of PE (pulmonary embolism)     Patient Active Problem List   Diagnosis Date Noted  . Constipation   . Dementia without behavioral disturbance ([place])   . Conjunctivitis 01/04/2019  . Acute encephalopathy 01/04/2019  . Acute metabolic encephalopathy 42/70/6237  . Sepsis ([place]) 01/03/2019  . Acute lower UTI 01/03/2019  . Hypotension 01/03/2019  . Multiple open wounds of lower leg 01/03/2019  . Prolonged QT interval 01/03/2019  . Bilateral lower leg cellulitis 11/10/2018  . Acute gastric ulcer with hemorrhage 03/10/2018  . GI bleed 02/01/2018  . Altered mental status 09/17/2017  . Seizure ([place]) 08/07/2017  . 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 Gastroesophageal reflux disease  10/22/2015  . Mild depression ([place]) 10/22/2015  . H/O: GI bleed 07/30/2015  . History of deep venous thrombosis 07/30/2015  . History of pulmonary embolism 07/30/2015  . 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 S/P closed reduction of dislocated total hip prosthesis 03/19/2015  . Essential hypertension 03/23/2014  . Hyperlipidemia 03/23/2014  . Hypothyroidism 03/23/2014    Past Surgical History:  Procedure Laterality Date  . ESOPHAGOGASTRODUODENOSCOPY N/A 02/02/2018   Procedure: ESOPHAGOGASTRODUODENOSCOPY (EGD);  Surgeon: Toledo, Benay Pike, MD;  Location: [facility] ENDOSCOPY;  Service: Gastroenterology;  Laterality: N/A;  . HIP CLOSED REDUCTION Right 12/21/2014   Procedure: CLOSED REDUCTION HIP;  Surgeon: Earnestine Leys, MD;  Location: [facility] ORS;  Service: Orthopedics;  Laterality: Right;  . HIP CLOSED REDUCTION Right 09/01/2018   Procedure: CLOSED REDUCTION HIP;  Surgeon: Dereck Leep, MD;  Location: [facility] ORS;  Service: Orthopedics;  Laterality: Right;  . TOTAL HIP ARTHROPLASTY Right    x3    Prior to Admission medications   Medication Sig Start Date End Date Taking? Authorizing Provider  acetaminophen (TYLENOL) 500 MG tablet Take 1,000 mg by mouth 2 (two) times daily.     [provider]  aspirin EC 81 MG tablet Take 81 mg by mouth daily.    [provider]  Calcium Carbonate-Vitamin D3 (CALCIUM 600-D) 600-400 MG-UNIT TABS Take 1 tablet by mouth 2 (two) times daily.    [provider]  clindamycin (CLEOCIN)  300 MG capsule Take 1 capsule (300 mg total) by mouth 3 (three) times daily for 10 days. 01/26/19 02/05/19  Arnaldo Natal, MD  escitalopram (LEXAPRO) 10 MG tablet Take 10 mg by mouth daily. 07/21/17   [provider]  feeding supplement, ENSURE ENLIVE, (ENSURE ENLIVE) LIQD Take 237 mLs by mouth 2 (two) times daily between meals. 01/07/19   Enedina Finner, MD  Ferrous Sulfate 142 (45 Fe) MG TBCR Take 45 mg by mouth daily.    [provider]  furosemide (LASIX) 20 MG tablet Take 1 tablet (20 mg total) by mouth every other day. 01/07/19   Enedina Finner, MD  lamoTRIgine (LAMICTAL) 25 MG tablet Take 37.5 mg by mouth daily.  01/17/18 01/17/19  [provider]  lamoTRIgine (LAMICTAL) 25 MG tablet Take 50 mg by mouth at bedtime.    [provider]  levothyroxine (SYNTHROID, LEVOTHROID) 75 MCG tablet Take 75 mcg by mouth daily.    [provider]  metoprolol succinate (TOPROL-XL) 25 MG 24 hr tablet Take 1 tablet (25 mg total) by mouth daily. 09/20/17   Alford Highland, MD  Multiple Vitamin (MULTIVITAMIN WITH MINERALS) TABS tablet Take 1 tablet by mouth daily. 01/08/19   Enedina Finner, MD  neomycin-bacitracin-polymyxin (NEOSPORIN) OINT Apply 1 application topically daily. (apply to left leg)    [provider]  pantoprazole (PROTONIX) 40 MG tablet Take 40 mg by mouth 2 (two) times daily.    [provider]  Polyethylene Glycol 3350 (PEG 3350) 17 g PACK Take 17 g by mouth daily as needed (moderate constipation).     [provider]  senna-docusate (SENOKOT-S) 8.6-50 MG tablet Take 2 tablets by mouth at bedtime.    [provider]  simvastatin (ZOCOR) 40 MG tablet Take 40 mg by mouth at bedtime. 05/04/17   [provider]  traZODone (DESYREL) 50 MG tablet Take 2 tablets (100 mg total) by mouth at bedtime as needed for sleep. 01/07/19   Enedina Finner, MD  white petrolatum (VASELINE) GEL Apply 1 application topically 3 (three) times daily. Apply to lips    [provider]    Allergies Amoxicillin, Latex, Lisinopril, and Mometasone furoate  Family History  Problem Relation Age of Onset  . CAD Father     Social History Social History   Tobacco Use  . Smoking status: Never Smoker  . Smokeless tobacco: Never Used  Substance Use Topics  . Alcohol use: No  . Drug use: No    Review of Systems  Constitutional: No fever/chills Eyes: No visual  changes. ENT: No sore throat. Cardiovascular: Denies chest pain. Respiratory: Denies shortness of breath. Gastrointestinal: No abdominal pain.  No nausea, no vomiting.  No diarrhea.  No constipation. Genitourinary: Negative for dysuria. Musculoskeletal: Negative for back pain. Skin: Negative for rash. Neurological: Negative for headaches, focal weakness   ____________________________________________   PHYSICAL EXAM:  VITAL SIGNS: ED Triage Vitals  Enc Vitals Group     BP 02/04/19 1635 (!) 116/56     Pulse Rate 02/04/19 1635 72     Resp 02/04/19 1635 16     Temp 02/04/19 1635 97.8 F (36.6 C)     Temp Source 02/04/19 1635 Oral     SpO2 02/04/19 1635 97 %     Weight 02/04/19 1636 197 lb (89.4 kg)     Height 02/04/19 1636 5\' 3"  (1.6 m)     Head Circumference --      Peak Flow --  Pain Score 02/04/19 1635 0     Pain Loc --      Pain Edu? --      Excl. in GC? --     Constitutional: Alert and oriented. Well appearing and in no acute distress. Eyes: Conjunctivae are normal. Head: Atraumatic. Nose: No congestion/rhinnorhea. Mouth/Throat: Mucous membranes are moist.  Oropharynx non-erythematous. Neck: No stridor.  Cardiovascular: Normal rate, regular rhythm. Grossly normal heart sounds.  Good peripheral circulation. Respiratory: Normal respiratory effort.  No retractions. Lungs CTAB. Gastrointestinal: Soft and nontender. No distention. No abdominal bruits. No CVA tenderness. Rectal: No masses some old hemorrhoidal tags around the anus.  Stool is dark but Hemoccult negative Musculoskeletal: No lower extremity tenderness bilateral 1+ edema.   Neurologic:  Normal speech and language. No gross focal neurologic deficits are appreciated. No gait instability. Skin:  Skin is warm, dry and intact. No rash noted.  Lower legs are slightly pink.  They are not red or angry looking.   ____________________________________________   LABS (all labs ordered are listed, but only abnormal  results are displayed)  Labs Reviewed  COMPREHENSIVE METABOLIC PANEL - Abnormal; Notable for the following components:      Result Value   BUN 32 (*)    Creatinine, Ser 1.87 (*)    Total Protein 6.3 (*)    GFR calc non Af Amer 23 (*)    GFR calc Af Amer 27 (*)    All other components within normal limits  CBC - Abnormal; Notable for the following components:   RBC 3.40 (*)    Hemoglobin 9.6 (*)    HCT 29.3 (*)    RDW 18.6 (*)    All other components within normal limits  TYPE AND SCREEN   ____________________________________________  EKG   ____________________________________________  RADIOLOGY  ED MD interpretation:   Official radiology report(s): No results found.  ____________________________________________   PROCEDURES  Procedure(s) performed (including Critical Care):  Procedures   ____________________________________________   INITIAL IMPRESSION / ASSESSMENT AND PLAN / ED COURSE  Megan Donaldson was evaluated in Emergency Department on 02/04/2019 for the symptoms described in the history of present illness. She was evaluated in the context of the global COVID-19 pandemic, which necessitated consideration that the patient might be at risk for infection with the SARS-CoV-2 virus that causes COVID-19. Institutional protocols and algorithms that pertain to the evaluation of patients at risk for COVID-19 are in a state of rapid change based on information released by regulatory bodies including the CDC and federal and state organizations. These policies and algorithms were followed during the patient's care in the ED.    Patient looks well and feels well she wants to go home.  She appears to be stable enough that we can let her do so.  I will ask your doctor to keep a close eye on her Advair.         ____________________________________________   FINAL CLINICAL IMPRESSION(S) / ED DIAGNOSES  Final diagnoses:  Dark stools     ED Discharge Orders     None       Note:  This document was prepared using Dragon voice recognition software and may include unintentional dictation errors.    Arnaldo NatalMalinda, Jule Whitsel F, MD 02/04/19 [id number]

## 2019-06-20 ENCOUNTER — Inpatient Hospital Stay
Admission: EM | Admit: 2019-06-20 | Discharge: 2019-06-24 | DRG: 315 | Disposition: A | Discharge status: Skilled Nursing Facility | Payer: Medicare Other | Source: Skilled Nursing Facility | Attending: Internal Medicine | Admitting: Internal Medicine

## 2019-06-20 ENCOUNTER — Emergency Department: Payer: Medicare Other

## 2019-06-20 ENCOUNTER — Other Ambulatory Visit: Payer: Self-pay

## 2019-06-20 ENCOUNTER — Encounter: Payer: Self-pay | Admitting: Emergency Medicine

## 2019-06-20 DIAGNOSIS — M545 Low back pain: Secondary | ICD-10-CM | POA: Diagnosis present

## 2019-06-20 DIAGNOSIS — I509 Heart failure, unspecified: Secondary | ICD-10-CM | POA: Diagnosis present

## 2019-06-20 DIAGNOSIS — T68XXXA Hypothermia, initial encounter: Secondary | ICD-10-CM | POA: Diagnosis present

## 2019-06-20 DIAGNOSIS — I110 Hypertensive heart disease with heart failure: Secondary | ICD-10-CM | POA: Diagnosis present

## 2019-06-20 DIAGNOSIS — E669 Obesity, unspecified: Secondary | ICD-10-CM | POA: Diagnosis present

## 2019-06-20 DIAGNOSIS — Z880 Allergy status to penicillin: Secondary | ICD-10-CM

## 2019-06-20 DIAGNOSIS — I959 Hypotension, unspecified: Principal | ICD-10-CM | POA: Diagnosis present

## 2019-06-20 DIAGNOSIS — W19XXXA Unspecified fall, initial encounter: Secondary | ICD-10-CM

## 2019-06-20 DIAGNOSIS — Z86711 Personal history of pulmonary embolism: Secondary | ICD-10-CM | POA: Diagnosis present

## 2019-06-20 DIAGNOSIS — K219 Gastro-esophageal reflux disease without esophagitis: Secondary | ICD-10-CM | POA: Diagnosis present

## 2019-06-20 DIAGNOSIS — R569 Unspecified convulsions: Secondary | ICD-10-CM | POA: Diagnosis present

## 2019-06-20 DIAGNOSIS — M810 Age-related osteoporosis without current pathological fracture: Secondary | ICD-10-CM | POA: Diagnosis present

## 2019-06-20 DIAGNOSIS — I10 Essential (primary) hypertension: Secondary | ICD-10-CM | POA: Diagnosis present

## 2019-06-20 DIAGNOSIS — I9589 Other hypotension: Secondary | ICD-10-CM | POA: Diagnosis not present

## 2019-06-20 DIAGNOSIS — D649 Anemia, unspecified: Secondary | ICD-10-CM | POA: Diagnosis present

## 2019-06-20 DIAGNOSIS — N179 Acute kidney failure, unspecified: Secondary | ICD-10-CM | POA: Diagnosis present

## 2019-06-20 DIAGNOSIS — E785 Hyperlipidemia, unspecified: Secondary | ICD-10-CM | POA: Diagnosis present

## 2019-06-20 DIAGNOSIS — D696 Thrombocytopenia, unspecified: Secondary | ICD-10-CM | POA: Diagnosis present

## 2019-06-20 DIAGNOSIS — Z7982 Long term (current) use of aspirin: Secondary | ICD-10-CM

## 2019-06-20 DIAGNOSIS — Z66 Do not resuscitate: Secondary | ICD-10-CM | POA: Diagnosis present

## 2019-06-20 DIAGNOSIS — F0390 Unspecified dementia without behavioral disturbance: Secondary | ICD-10-CM | POA: Diagnosis present

## 2019-06-20 DIAGNOSIS — I728 Aneurysm of other specified arteries: Secondary | ICD-10-CM | POA: Diagnosis present

## 2019-06-20 DIAGNOSIS — Z20822 Contact with and (suspected) exposure to covid-19: Secondary | ICD-10-CM | POA: Diagnosis present

## 2019-06-20 DIAGNOSIS — Z79899 Other long term (current) drug therapy: Secondary | ICD-10-CM

## 2019-06-20 DIAGNOSIS — Z79890 Hormone replacement therapy (postmenopausal): Secondary | ICD-10-CM

## 2019-06-20 DIAGNOSIS — R680 Hypothermia, not associated with low environmental temperature: Secondary | ICD-10-CM | POA: Diagnosis present

## 2019-06-20 DIAGNOSIS — Z23 Encounter for immunization: Secondary | ICD-10-CM

## 2019-06-20 DIAGNOSIS — Z9181 History of falling: Secondary | ICD-10-CM | POA: Diagnosis not present

## 2019-06-20 DIAGNOSIS — M25531 Pain in right wrist: Secondary | ICD-10-CM | POA: Diagnosis present

## 2019-06-20 DIAGNOSIS — Z6832 Body mass index (BMI) 32.0-32.9, adult: Secondary | ICD-10-CM

## 2019-06-20 DIAGNOSIS — Z8249 Family history of ischemic heart disease and other diseases of the circulatory system: Secondary | ICD-10-CM

## 2019-06-20 DIAGNOSIS — Z96641 Presence of right artificial hip joint: Secondary | ICD-10-CM | POA: Diagnosis present

## 2019-06-20 DIAGNOSIS — W1830XA Fall on same level, unspecified, initial encounter: Secondary | ICD-10-CM | POA: Diagnosis present

## 2019-06-20 DIAGNOSIS — S51012A Laceration without foreign body of left elbow, initial encounter: Secondary | ICD-10-CM | POA: Diagnosis present

## 2019-06-20 DIAGNOSIS — R531 Weakness: Secondary | ICD-10-CM

## 2019-06-20 DIAGNOSIS — E039 Hypothyroidism, unspecified: Secondary | ICD-10-CM | POA: Diagnosis present

## 2019-06-20 LAB — CBC WITH DIFFERENTIAL/PLATELET
Abs Immature Granulocytes: 0.03 10*3/uL (ref 0.00–0.07)
Basophils Absolute: 0 10*3/uL (ref 0.0–0.1)
Basophils Relative: 0 %
Eosinophils Absolute: 0.1 10*3/uL (ref 0.0–0.5)
Eosinophils Relative: 1 %
HCT: 30.9 % — ABNORMAL LOW (ref 36.0–46.0)
Hemoglobin: 10 g/dL — ABNORMAL LOW (ref 12.0–15.0)
Immature Granulocytes: 0 %
Lymphocytes Relative: 15 %
Lymphs Abs: 1.3 10*3/uL (ref 0.7–4.0)
MCH: 30.2 pg (ref 26.0–34.0)
MCHC: 32.4 g/dL (ref 30.0–36.0)
MCV: 93.4 fL (ref 80.0–100.0)
Monocytes Absolute: 0.8 10*3/uL (ref 0.1–1.0)
Monocytes Relative: 9 %
Neutro Abs: 6.6 10*3/uL (ref 1.7–7.7)
Neutrophils Relative %: 75 %
Platelets: 143 10*3/uL — ABNORMAL LOW (ref 150–400)
RBC: 3.31 MIL/uL — ABNORMAL LOW (ref 3.87–5.11)
RDW: 16.7 % — ABNORMAL HIGH (ref 11.5–15.5)
WBC: 8.8 10*3/uL (ref 4.0–10.5)
nRBC: 0 % (ref 0.0–0.2)

## 2019-06-20 LAB — URINALYSIS, COMPLETE (UACMP) WITH MICROSCOPIC
Bacteria, UA: NONE SEEN
Bilirubin Urine: NEGATIVE
Glucose, UA: NEGATIVE mg/dL
Hgb urine dipstick: NEGATIVE
Ketones, ur: NEGATIVE mg/dL
Leukocytes,Ua: NEGATIVE
Nitrite: NEGATIVE
Protein, ur: NEGATIVE mg/dL
Specific Gravity, Urine: 1.01 (ref 1.005–1.030)
Squamous Epithelial / HPF: NONE SEEN (ref 0–5)
pH: 7 (ref 5.0–8.0)

## 2019-06-20 LAB — BASIC METABOLIC PANEL
Anion gap: 8 (ref 5–15)
BUN: 23 mg/dL (ref 8–23)
CO2: 27 mmol/L (ref 22–32)
Calcium: 9.3 mg/dL (ref 8.9–10.3)
Chloride: 104 mmol/L (ref 98–111)
Creatinine, Ser: 1.63 mg/dL — ABNORMAL HIGH (ref 0.44–1.00)
GFR calc Af Amer: 32 mL/min — ABNORMAL LOW (ref >60)
GFR calc non Af Amer: 28 mL/min — ABNORMAL LOW (ref >60)
Glucose, Bld: 97 mg/dL (ref 70–99)
Potassium: 3.9 mmol/L (ref 3.5–5.1)
Sodium: 139 mmol/L (ref 135–145)

## 2019-06-20 LAB — RESPIRATORY PANEL BY RT PCR (FLU A&B, COVID)
Influenza A by PCR: NEGATIVE
Influenza B by PCR: NEGATIVE
SARS Coronavirus 2 by RT PCR: NEGATIVE

## 2019-06-20 LAB — PROCALCITONIN: Procalcitonin: <0.1 ng/mL

## 2019-06-20 LAB — PROTIME-INR
INR: 0.9 (ref 0.8–1.2)
Prothrombin Time: 12 seconds (ref 11.4–15.2)

## 2019-06-20 LAB — TSH: TSH: 14.696 u[IU]/mL — ABNORMAL HIGH (ref 0.350–4.500)

## 2019-06-20 LAB — HEMOGLOBIN AND HEMATOCRIT, BLOOD
HCT: 30.1 % — ABNORMAL LOW (ref 36.0–46.0)
Hemoglobin: 9.7 g/dL — ABNORMAL LOW (ref 12.0–15.0)

## 2019-06-20 LAB — TYPE AND SCREEN
ABO/RH(D): A POS
Antibody Screen: NEGATIVE

## 2019-06-20 LAB — APTT: aPTT: 37 seconds — ABNORMAL HIGH (ref 24–36)

## 2019-06-20 LAB — LACTIC ACID, PLASMA: Lactic Acid, Venous: 1.1 mmol/L (ref 0.5–1.9)

## 2019-06-20 LAB — T4, FREE: Free T4: 1.04 ng/dL (ref 0.61–1.12)

## 2019-06-20 MED ORDER — SIMVASTATIN 20 MG PO TABS
40.0000 mg | ORAL_TABLET | Freq: Every day | ORAL | Status: DC
  Administered 2019-06-20 – 2019-06-23 (×4): 40 mg via ORAL
  Filled 2019-06-20 (×4): qty 2

## 2019-06-20 MED ORDER — SODIUM CHLORIDE 0.9 % IV BOLUS
1000.0000 mL | Freq: Once | INTRAVENOUS | Status: AC
  Administered 2019-06-20: 1000 mL via INTRAVENOUS

## 2019-06-20 MED ORDER — SODIUM CHLORIDE 0.9 % IV SOLN
2.0000 g | Freq: Once | INTRAVENOUS | Status: AC
  Administered 2019-06-20: 2 g via INTRAVENOUS
  Filled 2019-06-20: qty 2

## 2019-06-20 MED ORDER — POLYETHYLENE GLYCOL 3350 17 G PO PACK: 17.0000 g | PACK | Freq: Every day | ORAL | Status: DC | PRN

## 2019-06-20 MED ORDER — ASPIRIN EC 81 MG PO TBEC
81.0000 mg | DELAYED_RELEASE_TABLET | Freq: Every day | ORAL | Status: DC
  Administered 2019-06-20 – 2019-06-24 (×5): 81 mg via ORAL
  Filled 2019-06-20 (×5): qty 1

## 2019-06-20 MED ORDER — PANTOPRAZOLE SODIUM 40 MG PO TBEC
40.0000 mg | DELAYED_RELEASE_TABLET | Freq: Every day | ORAL | Status: DC
  Administered 2019-06-20 – 2019-06-24 (×5): 40 mg via ORAL
  Filled 2019-06-20 (×5): qty 1

## 2019-06-20 MED ORDER — CALCIUM CARBONATE-VITAMIN D 500-200 MG-UNIT PO TABS
1.0000 | ORAL_TABLET | Freq: Two times a day (BID) | ORAL | Status: DC
  Administered 2019-06-20 – 2019-06-24 (×8): 1 via ORAL
  Filled 2019-06-20 (×8): qty 1

## 2019-06-20 MED ORDER — TECHNETIUM TO 99M ALBUMIN AGGREGATED
4.0000 | Freq: Once | INTRAVENOUS | Status: AC | PRN
  Administered 2019-06-20: 4.24 via INTRAVENOUS

## 2019-06-20 MED ORDER — LEVOTHYROXINE SODIUM 50 MCG PO TABS
75.0000 ug | ORAL_TABLET | Freq: Every day | ORAL | Status: DC
  Administered 2019-06-21 – 2019-06-24 (×4): 75 ug via ORAL
  Filled 2019-06-20 (×4): qty 1

## 2019-06-20 MED ORDER — SENNOSIDES-DOCUSATE SODIUM 8.6-50 MG PO TABS
2.0000 | ORAL_TABLET | Freq: Every day | ORAL | Status: DC
  Administered 2019-06-20 – 2019-06-23 (×4): 2 via ORAL
  Filled 2019-06-20 (×4): qty 2

## 2019-06-20 MED ORDER — TETANUS-DIPHTH-ACELL PERTUSSIS 5-2.5-18.5 LF-MCG/0.5 IM SUSP
0.5000 mL | Freq: Once | INTRAMUSCULAR | Status: AC
  Administered 2019-06-20: 0.5 mL via INTRAMUSCULAR
  Filled 2019-06-20: qty 0.5

## 2019-06-20 MED ORDER — ACETAMINOPHEN 500 MG PO TABS
1000.0000 mg | ORAL_TABLET | Freq: Once | ORAL | Status: AC
  Administered 2019-06-20: 1000 mg via ORAL
  Filled 2019-06-20: qty 2

## 2019-06-20 MED ORDER — ENSURE ENLIVE PO LIQD
237.0000 mL | Freq: Two times a day (BID) | ORAL | Status: DC
  Administered 2019-06-21 – 2019-06-24 (×6): 237 mL via ORAL

## 2019-06-20 MED ORDER — SODIUM CHLORIDE 0.45 % IV SOLN: INTRAVENOUS | Status: DC

## 2019-06-20 MED ORDER — BACITRACIN-NEOMYCIN-POLYMYXIN 400-5-5000 EX OINT
1.0000 "application " | TOPICAL_OINTMENT | Freq: Every day | CUTANEOUS | Status: DC
  Administered 2019-06-21 – 2019-06-24 (×4): 1 via TOPICAL
  Filled 2019-06-20 (×5): qty 1

## 2019-06-20 MED ORDER — LAMOTRIGINE 100 MG PO TABS
50.0000 mg | ORAL_TABLET | Freq: Every day | ORAL | Status: DC
  Administered 2019-06-20 – 2019-06-23 (×4): 50 mg via ORAL
  Filled 2019-06-20 (×4): qty 1

## 2019-06-20 MED ORDER — ACETAMINOPHEN 500 MG PO TABS
1000.0000 mg | ORAL_TABLET | Freq: Two times a day (BID) | ORAL | Status: DC
  Administered 2019-06-20 – 2019-06-24 (×8): 1000 mg via ORAL
  Filled 2019-06-20 (×8): qty 2

## 2019-06-20 MED ORDER — VANCOMYCIN HCL IN DEXTROSE 1-5 GM/200ML-% IV SOLN
1000.0000 mg | Freq: Once | INTRAVENOUS | Status: AC
  Administered 2019-06-20: 1000 mg via INTRAVENOUS
  Filled 2019-06-20: qty 200

## 2019-06-20 MED ORDER — ADULT MULTIVITAMIN W/MINERALS CH
1.0000 | ORAL_TABLET | Freq: Every day | ORAL | Status: DC
  Administered 2019-06-20 – 2019-06-24 (×5): 1 via ORAL
  Filled 2019-06-20 (×5): qty 1

## 2019-06-20 MED ORDER — METRONIDAZOLE IN NACL 5-0.79 MG/ML-% IV SOLN
500.0000 mg | Freq: Once | INTRAVENOUS | Status: AC
  Administered 2019-06-20: 500 mg via INTRAVENOUS
  Filled 2019-06-20: qty 100

## 2019-06-20 MED ORDER — FERROUS SULFATE 325 (65 FE) MG PO TABS
325.0000 mg | ORAL_TABLET | Freq: Every day | ORAL | Status: DC
  Administered 2019-06-21 – 2019-06-24 (×4): 325 mg via ORAL
  Filled 2019-06-20 (×4): qty 1

## 2019-06-20 MED ORDER — TECHNETIUM TC 99M DIETHYLENETRIAME-PENTAACETIC ACID
30.3000 | Freq: Once | INTRAVENOUS | Status: AC | PRN
  Administered 2019-06-20: 30.3 via RESPIRATORY_TRACT

## 2019-06-20 MED ORDER — TRAZODONE HCL 50 MG PO TABS
100.0000 mg | ORAL_TABLET | Freq: Every evening | ORAL | Status: DC | PRN
  Administered 2019-06-23: 21:00:00 100 mg via ORAL
  Filled 2019-06-20: qty 2

## 2019-06-20 MED ORDER — WHITE PETROLATUM GEL
1.0000 "application " | Freq: Three times a day (TID) | Status: DC | PRN
  Filled 2019-06-20: qty 71

## 2019-06-20 MED ORDER — MELATONIN 5 MG PO TABS
5.0000 mg | ORAL_TABLET | Freq: Every day | ORAL | Status: DC
  Administered 2019-06-21 – 2019-06-22 (×3): 5 mg via ORAL
  Filled 2019-06-20 (×6): qty 1

## 2019-06-20 NOTE — ED Notes (Signed)
Pt daughter updated regarding d/c status and states she is unable to transport pt back to facility. If facility unable to transport pt back, daughter notified pt may receive bill from ems for transport services

## 2019-06-20 NOTE — ED Notes (Addendum)
Pt BP low at this time, retaken and cuff relocated to other arm and retaken. BP still low, MD notified at this time. Pt alert, and easily aroused but states she feels "tired". Answering questions appropriately, able to follow commands speech clear at this time

## 2019-06-20 NOTE — ED Notes (Signed)
Non adherent bandage placed over steri strips on left elbow and wrapped.

## 2019-06-20 NOTE — ED Provider Notes (Addendum)
[facility] Emergency Department Provider Note  ____________________________________________   First MD Initiated Contact with Patient 06/20/19 0700     (approximate)  I have reviewed the triage vital signs and the nursing notes.   HISTORY  Chief Complaint Fall    HPI Megan Donaldson is a 86 y.o. female with dementia, hypertension, hyperlipidemia, history of PE but is not appear to be on any blood thinners she comes in for a fall.  Patient is coming from [place] [facility].  Patient was attempting to sit down in chair and missed it and fell.  Patient having some lower back pain.  Patient states that she does think she hit her head.  She stating that she is got some lower back pain that is mild, constant, getting better now with time, nothing seems to make it worse.  She also reports some pain on her right wrist and right forearm where she has a skin tear as well as superficial laceration on her left elbow.  she denies any shortness of breath, chest pain, abdominal pain.  She otherwise feels at her normal self.          Past Medical History:  Diagnosis Date  . CHF (congestive heart failure) (HCC)   . Dementia (HCC)   . GERD (gastroesophageal reflux disease)   . Hyperlipidemia   . Hypertension   . Osteoporosis   . Personal history of other venous thrombosis and embolism   . Personal history of PE (pulmonary embolism)     Patient Active Problem List   Diagnosis Date Noted  . Constipation   . Dementia without behavioral disturbance (HCC)   . Conjunctivitis 01/04/2019  . Acute encephalopathy 01/04/2019  . Acute metabolic encephalopathy 01/03/2019  . Sepsis (HCC) 01/03/2019  . Acute lower UTI 01/03/2019  . Hypotension 01/03/2019  . Multiple open wounds of lower leg 01/03/2019  . Prolonged QT interval 01/03/2019  . Bilateral lower leg cellulitis 11/10/2018  . Acute gastric ulcer with hemorrhage 03/10/2018  . GI bleed 02/01/2018  . Altered mental status  09/17/2017  . Seizure (HCC) 08/07/2017  . 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 Gastroesophageal reflux disease 10/22/2015  . Mild depression (HCC) 10/22/2015  . H/O: GI bleed 07/30/2015  . History of deep venous thrombosis 07/30/2015  . History of pulmonary embolism 07/30/2015  . 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 S/P closed reduction of dislocated total hip prosthesis 03/19/2015  . Essential hypertension 03/23/2014  . Hyperlipidemia 03/23/2014  . Hypothyroidism 03/23/2014    Past Surgical History:  Procedure Laterality Date  . ESOPHAGOGASTRODUODENOSCOPY N/A 02/02/2018   Procedure: ESOPHAGOGASTRODUODENOSCOPY (EGD);  Surgeon: Toledo, Boykin Nearing, MD;  Location: [facility] ENDOSCOPY;  Service: Gastroenterology;  Laterality: N/A;  . HIP CLOSED REDUCTION Right 12/21/2014   Procedure: CLOSED REDUCTION HIP;  Surgeon: Deeann Saint, MD;  Location: [facility] ORS;  Service: Orthopedics;  Laterality: Right;  . HIP CLOSED REDUCTION Right 09/01/2018   Procedure: CLOSED REDUCTION HIP;  Surgeon: Donato Heinz, MD;  Location: [facility] ORS;  Service: Orthopedics;  Laterality: Right;  . TOTAL HIP ARTHROPLASTY Right    x3    Prior to Admission medications   Medication Sig Start Date End Date Taking? Authorizing Provider  acetaminophen (TYLENOL) 500 MG tablet Take 1,000 mg by mouth 2 (two) times daily.     [provider]  aspirin EC 81 MG tablet Take 81 mg by mouth daily.    [provider]  Calcium Carbonate-Vitamin D3 (CALCIUM 600-D) 600-400  MG-UNIT TABS Take 1 tablet by mouth 2 (two) times daily.    [provider]  escitalopram (LEXAPRO) 10 MG tablet Take 10 mg by mouth daily. 07/21/17   [provider]  feeding supplement, ENSURE ENLIVE, (ENSURE ENLIVE) LIQD Take 237 mLs by mouth 2 (two) times daily between meals. 01/07/19   Enedina Finner, MD  Ferrous Sulfate 142 (45 Fe) MG TBCR Take 45 mg by mouth daily.    [provider]  furosemide  (LASIX) 20 MG tablet Take 1 tablet (20 mg total) by mouth every other day. 01/07/19   Enedina Finner, MD  lamoTRIgine (LAMICTAL) 25 MG tablet Take 37.5 mg by mouth daily.  01/17/18 01/17/19  [provider]  lamoTRIgine (LAMICTAL) 25 MG tablet Take 50 mg by mouth at bedtime.    [provider]  levothyroxine (SYNTHROID, LEVOTHROID) 75 MCG tablet Take 75 mcg by mouth daily.    [provider]  metoprolol succinate (TOPROL-XL) 25 MG 24 hr tablet Take 1 tablet (25 mg total) by mouth daily. 09/20/17   Alford Highland, MD  Multiple Vitamin (MULTIVITAMIN WITH MINERALS) TABS tablet Take 1 tablet by mouth daily. 01/08/19   Enedina Finner, MD  neomycin-bacitracin-polymyxin (NEOSPORIN) OINT Apply 1 application topically daily. (apply to left leg)    [provider]  pantoprazole (PROTONIX) 40 MG tablet Take 40 mg by mouth 2 (two) times daily.    [provider]  Polyethylene Glycol 3350 (PEG 3350) 17 g PACK Take 17 g by mouth daily as needed (moderate constipation).     [provider]  senna-docusate (SENOKOT-S) 8.6-50 MG tablet Take 2 tablets by mouth at bedtime.    [provider]  simvastatin (ZOCOR) 40 MG tablet Take 40 mg by mouth at bedtime. 05/04/17   [provider]  traZODone (DESYREL) 50 MG tablet Take 2 tablets (100 mg total) by mouth at bedtime as needed for sleep. 01/07/19   Enedina Finner, MD  white petrolatum (VASELINE) GEL Apply 1 application topically 3 (three) times daily. Apply to lips    [provider]    Allergies Amoxicillin, Latex, Lisinopril, and Mometasone furoate  Family History  Problem Relation Age of Onset  . CAD Father     Social History Social History   Tobacco Use  . Smoking status: Never Smoker  . Smokeless tobacco: Never Used  Substance Use Topics  . Alcohol use: No  . Drug use: No      Review of Systems Constitutional: No fever/chills, fall Eyes: No visual changes. ENT: No sore  throat.  Positive hit head Cardiovascular: Denies chest pain. Respiratory: Denies shortness of breath. Gastrointestinal: No abdominal pain.  No nausea, no vomiting.  No diarrhea.  No constipation. Genitourinary: Negative for dysuria. Musculoskeletal: Positive back pain wrist pain Skin: Negative for rash. Neurological: Negative for headaches, focal weakness or numbness. All other ROS negative ____________________________________________   PHYSICAL EXAM:  VITAL SIGNS: ED Triage Vitals  Enc Vitals Group     BP 06/20/19 0639 131/72     Pulse Rate 06/20/19 0639 72     Resp 06/20/19 0639 13     Temp 06/20/19 0639 (!) 97.5 F (36.4 C)     Temp Source 06/20/19 0639 Oral     SpO2 06/20/19 0639 92 %     Weight 06/20/19 0641 197 lb 1.5 oz (89.4 kg)     Height 06/20/19 0641  (1.626 m)     Head Circumference --      Peak  Flow --      Pain Score --      Pain Loc --      Pain Edu? --      Excl. in GC? --     Constitutional: Alert   Eyes: Conjunctivae are normal. EOMI. Head: Atraumatic but patient reports hitting her head Nose: No congestion/rhinnorhea. Mouth/Throat: Mucous membranes are moist.   Neck: No stridor. Trachea Midline. FROM Cardiovascular: Normal rate, regular rhythm. Grossly normal heart sounds.  Good peripheral circulation. No chest wall tenderness  Respiratory: Normal respiratory effort.  No retractions. Lungs CTAB. Gastrointestinal: Soft and nontender. No distention. No abdominal bruits.  Musculoskeletal:   RUE: Skin tear on the forearm with some tenderness.  Radial pulse intact. Neuro intact. Full ROM in joint. LUE: Superficial laceration to the elbow, no deformity or other signs of injury. Radial pulse intact. Neuro intact. Full ROM in joints RLE: No point tenderness, deformity or other signs of injury. DP pulse intact. Neuro intact. Full ROM in joints.  Able to lift the leg up off the bed LLE: No point tenderness, deformity or other signs of injury. DP pulse  intact. Neuro intact. Full ROM in joints.  Able to lift the leg up off the bed  To note patient has her legs wrapped up with some chronic venous stasis changes underneath Neurologic:  Normal speech and language. No gross focal neurologic deficits are appreciated.  Skin:  Skin is warm, dry and intact. No rash noted. Psychiatric: Mood and affect are normal. Speech and behavior are normal. GU: Deferred  Back: Seems to have some L-spine tenderness possibly T-spine tenderness although patient is on a great historian ____________________________________________   LABS (all labs ordered are listed, but only abnormal results are displayed)  Labs Reviewed  CBC WITH DIFFERENTIAL/PLATELET - Abnormal; Notable for the following components:      Result Value   RBC 3.31 (*)    Hemoglobin 10.0 (*)    HCT 30.9 (*)    RDW 16.7 (*)    Platelets 143 (*)    All other components within normal limits  BASIC METABOLIC PANEL - Abnormal; Notable for the following components:   Creatinine, Ser 1.63 (*)    GFR calc non Af Amer 28 (*)    GFR calc Af Amer 32 (*)    All other components within normal limits  APTT - Abnormal; Notable for the following components:   aPTT 37 (*)    All other components within normal limits  HEMOGLOBIN AND HEMATOCRIT, BLOOD - Abnormal; Notable for the following components:   Hemoglobin 9.7 (*)    HCT 30.1 (*)    All other components within normal limits  URINALYSIS, COMPLETE (UACMP) WITH MICROSCOPIC - Abnormal; Notable for the following components:   Color, Urine YELLOW (*)    APPearance CLEAR (*)    All other components within normal limits  RESPIRATORY PANEL BY RT PCR (FLU A&B, COVID)  PROTIME-INR  LACTIC ACID, PLASMA  LACTIC ACID, PLASMA  TYPE AND SCREEN   ____________________________________________   ED ECG REPORT I, Concha SeMary E Zahmir Lalla, the attending physician, personally viewed and interpreted this ECG.  EKG is sinus rate of 70, no ST elevations, no T wave inversions,  normal intervals ____________________________________________  RADIOLOGY   Official radiology report(s): DG Elbow Complete Left  Result Date: 06/20/2019 CLINICAL DATA:  Pain following fall EXAM: LEFT ELBOW - COMPLETE 3+ VIEW COMPARISON:  None. FINDINGS: Frontal, lateral, and bilateral oblique views were obtained. No evident fracture or dislocation. No  joint effusion. There is narrowing of the elbow joint without erosive change. Focus of calcification is noted adjacent to the lateral distal humeral condyle. IMPRESSION: Osteoarthritic change. Probable calcific tendinosis laterally. No fracture, dislocation, or joint effusion. Electronically Signed   By: Bretta Bang III M.D.   On: 06/20/2019 09:08   DG Forearm Right  Result Date: 06/20/2019 CLINICAL DATA:  Right wrist and forearm pain secondary to a fall this morning. EXAM: RIGHT FOREARM - 2 VIEW COMPARISON:  Radiographs dated 11/10/2018 FINDINGS: There is no fracture or dislocation. Arthritic changes at the elbow possible calcified loose body in the joint. Osteophyte on the radial head. IMPRESSION: No acute abnormality. Electronically Signed   By: Francene Boyers M.D.   On: 06/20/2019 08:10   DG Wrist Complete Right  Result Date: 06/20/2019 CLINICAL DATA:  Acute right wrist pain after fall today. EXAM: RIGHT WRIST - COMPLETE 3+ VIEW COMPARISON:  None. FINDINGS: There is no evidence of fracture or dislocation. Severe degenerative changes seen involving the first carpometacarpal joint. Soft tissues are unremarkable. IMPRESSION: Severe degenerative joint disease of the first carpometacarpal joint. No acute abnormality seen in the right wrist. Electronically Signed   By: Lupita Raider M.D.   On: 06/20/2019 08:07   CT Head Wo Contrast  Result Date: 06/20/2019 CLINICAL DATA:  Headache after head trauma secondary to a fall this morning. EXAM: CT HEAD WITHOUT CONTRAST TECHNIQUE: Contiguous axial images were obtained from the base of the skull through  the vertex without intravenous contrast. COMPARISON:  CT scan of the head dated 09/17/2017 FINDINGS: Brain: No evidence of acute infarction, hemorrhage, hydrocephalus, extra-axial collection or mass lesion/mass effect. Mild atrophy. Extensive periventricular white matter lucency consistent with chronic small vessel ischemic disease, stable. Vascular: No hyperdense vessel or unexpected calcification. Skull: Normal. Negative for fracture or focal lesion. Sinuses/Orbits: Normal. Other: None IMPRESSION: No acute abnormality. Stable atrophy with extensive chronic small vessel ischemic disease. Electronically Signed   By: Francene Boyers M.D.   On: 06/20/2019 08:12   CT Cervical Spine Wo Contrast  Result Date: 06/20/2019 CLINICAL DATA:  Trauma secondary to a fall this morning. EXAM: CT CERVICAL SPINE WITHOUT CONTRAST TECHNIQUE: Multidetector CT imaging of the cervical spine was performed without intravenous contrast. Multiplanar CT image reconstructions were also generated. COMPARISON:  CT scan dated 02/11/2010 FINDINGS: Alignment: 2 mm spondylolisthesis of C7 on T1. Skull base and vertebrae: No fracture. The C4-5 level is ankylosed. 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 This has enlarged appreciably since the prior CT scan. No visible canal hematoma. No prevertebral soft tissue swelling. Disc levels: There is diffuse disc space narrowing throughout the cervical spine without visible disc protrusion. No significant foraminal stenoses. Moderate right facet arthritis at C2-3. Ankylosis of the right facet joint and vertebral bodies at C4-5. Moderate bilateral facet arthritis at C7-T1. The facet joints at C7-T1 may be ankylosed. Upper chest: Negative. Other: None IMPRESSION: 1. No acute abnormality of the cervical spine. 2.  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 The Electronically Signed   By: Francene Boyers M.D.   On: 06/20/2019 08:19   CT Thoracic Spine Wo Contrast  Result Date: 06/20/2019  CLINICAL DATA:  Fall from standing EXAM: CT THORACIC AND LUMBAR SPINE WITHOUT CONTRAST TECHNIQUE: Multidetector CT imaging of the thoracic and lumbar spine was performed without contrast. Multiplanar CT image reconstructions were also generated. COMPARISON:  None. FINDINGS: CT THORACIC SPINE FINDINGS Alignment: Anteroposterior alignment is maintained. Vertebrae: There is no acute fracture or compression deformity identified. No suspicious osseous lesion. Paraspinal and other soft tissues: Hiatal hernia. Bibasilar atelectasis. Aortic atherosclerosis. Disc levels: Multilevel degenerative changes with disc space narrowing, endplate osteophytes and irregularity, vacuum disc phenomenon, and facet hypertrophy. There is no high-grade osseous encroachment on the spinal canal. CT LUMBAR SPINE FINDINGS Segmentation: 5 lumbar type vertebrae. Alignment: Levoscoliosis.  Trace retrolisthesis at L2-L3. Vertebrae: No acute fracture or compression deformity. No suspicious osseous lesion. Paraspinal and other soft tissues: Subcentimeter nonobstructing right upper pole renal collecting system calculus. Aortic atherosclerosis. Disc levels: Multilevel degenerative changes are present with disc space narrowing, endplate osteophytes and irregularity, and facet hypertrophy. There is no high-grade osseous encroachment on the spinal canal. IMPRESSION: No acute fracture of the thoracolumbar spine. Electronically Signed   By: Guadlupe Spanish M.D.   On: 06/20/2019 08:15   CT Lumbar Spine Wo Contrast  Result Date: 06/20/2019 CLINICAL DATA:  Fall from standing EXAM: CT THORACIC AND LUMBAR SPINE WITHOUT  CONTRAST TECHNIQUE: Multidetector CT imaging of the thoracic and lumbar spine was performed without contrast. Multiplanar CT image reconstructions were also generated. COMPARISON:  None. FINDINGS: CT THORACIC SPINE FINDINGS Alignment: Anteroposterior alignment is maintained. Vertebrae: There is no acute fracture or compression deformity identified. No suspicious osseous lesion. Paraspinal and other soft tissues: Hiatal hernia. Bibasilar atelectasis. Aortic atherosclerosis. Disc levels: Multilevel degenerative changes with disc space narrowing, endplate osteophytes and irregularity, vacuum disc phenomenon, and facet hypertrophy. There is no high-grade osseous encroachment on the spinal canal. CT LUMBAR SPINE FINDINGS Segmentation: 5 lumbar type vertebrae. Alignment: Levoscoliosis.  Trace retrolisthesis at L2-L3. Vertebrae: No acute fracture or compression deformity. No suspicious osseous lesion. Paraspinal and other soft tissues: Subcentimeter nonobstructing right upper pole renal collecting system calculus. Aortic atherosclerosis. Disc levels: Multilevel degenerative changes are present with disc space narrowing, endplate osteophytes and irregularity, and facet hypertrophy. There is no high-grade osseous encroachment on the spinal canal. IMPRESSION: No acute fracture of the thoracolumbar spine. Electronically Signed   By: Guadlupe Spanish M.D.   On: 06/20/2019 08:15   DG Chest Portable 1 View  Result Date: 06/20/2019 CLINICAL DATA:  Shortness of breath. EXAM: PORTABLE CHEST 1 VIEW COMPARISON:  Chest x-ray 01/26/2019 and chest CT dated 04/21/2010 FINDINGS: The heart size and pulmonary vascularity are normal. No infiltrates or effusions. Minimal linear atelectasis in the right midzone secondary to a shallow inspiration. No appreciable effusions. No acute bone abnormality. 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 IMPRESSION: No significant abnormality of the  chest. 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 Electronically Signed   By: Francene Boyers M.D.   On: 06/20/2019 08:08    ____________________________________________   PROCEDURES  Procedure(s) performed (including Critical Care):  Marland KitchenMarland KitchenLaceration Repair  Date/Time: 06/20/2019 9:48 AM Performed by: Concha Se, MD Authorized by: Concha Se, MD   Consent:    Consent obtained:  Verbal   Consent given by:  Guardian   Risks discussed:  Infection, need for additional repair, nerve damage, pain, poor cosmetic result, poor wound healing, vascular damage and tendon damage   Alternatives discussed:  No treatment Anesthesia (see MAR for exact dosages):  Anesthesia method:  None Laceration details:    Location:  Shoulder/arm   Shoulder/arm location:  L elbow   Length (cm):  5   Depth (mm):  2 Exploration:    Wound exploration: entire depth of wound probed and visualized     Wound extent: no areolar tissue violation noted, no fascia violation noted and no foreign bodies/material noted     Contaminated: no   Treatment:    Area cleansed with:  Saline   Amount of cleaning:  Standard Skin repair:    Repair method: 3 dermaclips  Post-procedure details:    Dressing:  Sterile dressing   Patient tolerance of procedure:  Tolerated well, no immediate complications     ____________________________________________   INITIAL IMPRESSION / ASSESSMENT AND PLAN / ED COURSE   ELONA YINGER was evaluated in Emergency Department on 06/20/2019 for the symptoms described in the history of present illness. She was evaluated in the context of the global COVID-19 pandemic, which necessitated consideration that the patient might be at risk for infection with the SARS-CoV-2 virus that causes COVID-19. Institutional protocols and algorithms that pertain to the evaluation of patients at risk for COVID-19 are in a state of rapid change based on information released by regulatory bodies  including the CDC and federal and state organizations. These policies and algorithms were followed during the patient's care in the ED.    Patient comes in with what sounds of mechanical fall.  EKG obtained no signs of arrhythmia.  Patient's initial sats were 92% but patient satting 94 to 95% denies any shortness of breath.  Will get a chest x-ray to make sure no large pleural effusions or pneumonia given history somewhat limited due to her dementia.  Will get x-rays of the wrist and forearm and elbow to make sure no signs of fracture.  Given patient does report hitting her head will get CT head to evaluate for intracranial hemorrhage, CT cervical evaluate for cervical fracture and CT thoracic and CT lumbar to evaluate for additional fractures.  Patient has no chest wall tenderness to suggest chest pathology and no abdominal tenderness to suggest abdominal pathology.  Patient is able to lift both legs up off the bed I have low suspicion for hip fracture.  Hemoglobin is around baseline  Kidney function is around baseline but is elevated with CKD  CT cervical shows an enlarging 12 mm irregular ossifications with concerns at slightly impinging on the spinal canal  X-rays otherwise are negative, CT scans otherwise are negative  Patient has small skin tear that was a dressing was placed on.  She also has a superficial laceration that some derma clips were placed.  Discussed with patient's daughter who is the POA about the abnormal CT scan we discussed the CT finding in the neck.  She stated that they would not want surgery and patient is currently neurologically intact without signs of cord compression.  They were curious to see what neurosurgery would have to say so I did discuss with Dr. Cari Caraway from neurosurgery who said that he would not recommend elective surgery due to the risk of surgery.  He said that it looks similar to 1 to 2 years ago.  He states that he is mostly from arthritis.  He would not  recommend any interventions unless there was a neurological change and even then did have to weigh the pros and cons of surgery.  Daughter at this time does not feel like she wants to follow-up with neurosurgery  and is happy with this information and they will continue to monitor her for any kinds of changes in her neurological status.  To note patient has some chronic venous stasis of her legs.  According to daughter recently on antibiotics.  Discussed with daughter that she has no fever and no white count elevation.  She does have some redness on the bilateral legs but I suspect is more likely related to the above they do not look infected at this time.  Patient is able to ambulate with a walker.  At this time patient seems to be at her baseline self and will send patient back to her facility  Upon discharging patient patient's blood pressure noted to be lower.  In the 60s systolic.  Patient will be given 1 L of fluid.  Will get CT angio to make sure no evidence of aneurysm, dissection given the back pain.  Discussed with Dr. Allena Katz from radiology who reviewed patient's CT scan.  Patient has arthrosclerotic aorta and given the entire aorta was visualized and there was no signs of any aneurysm or any signs of free fluid in the abdomen.  At this point the CT abdomen with contrast would have potential risk with the contrast going into renal failure so they recommended that we hold off or consider different imaging options if felt warranted.  Patient's hemoglobin is stable.  Will work patient up for PE with VQ scan.  Patient getting 1 L of fluid and pressures are improving.  Her daughter who stated that she would be open to fluids antibiotics but she would not want pressors.  They are hoping that patient can go back to facility and would like to avoid admission if possible.  She states that her temperature usually runs about 36 to 35 degrees regardless.  Patient's blood pressures getting better after the 1 L  of fluid.  Most recent check 108/54.  Patient has been mentating throughout.  She does have some of her baseline confusion when you ask her questions but she is very pleasant denies any chest wall pain or abdominal pain.  Her lactate was normal so there is no signs of underperfusion.  Her urine without evidence of UTI.  Her VQ scan shows low probability of PE.  I discussed with patient's daughter and stated that I was uncomfortable giving I did was not clear exactly why her blood pressure was so low we could admit patient and there is a chance that we could be missing something such as a dissection or infection although I do not see any obvious signs of that at this time.  Even if there was a dissection family would not want a surgical intervention.  They stated that if she seems to be at her baseline and her blood pressures are staying at what they are today would really prefer patient to go home (even if lower then her normal) and they can continue to monitor the blood pressures there.  They understand that we do not know exactly what caused her drop in blood pressures and so there is a risk that she could go back and that she could potentially die from unclear etiologies.  However they understand given her condition and her dementia that they would not want further extreme work-up and they would rather keep her comfortable at home.  I discussed that we also could get hospice involved if she continued to have some lower blood pressures.  She stated that she is had intermittent low blood pressures  in the past for unclear reasons.  Given this discussion with patient's POA the daughter and patient's age and baseline dementia I think that we should do it along with the family's and patient's wishes and have her return to the facility and if things are worsening and they decide they would like further work-up or evaluation she can was return to the ER.   Patient's repeat rectal temperature is 94.4.  Discussed with  family that this drop and temperature was concerning for the possibility of infection.  We discussed the options.  At this point they would prefer patient to be admitted for blood culture rule out to get broad-spectrum antibiotics.  Will discuss with hospital team for admission and will start patient on broad-spectrum antibiotics for concerns for infection.     ____________________________________________   FINAL CLINICAL IMPRESSION(S) / ED DIAGNOSES   Final diagnoses:  Fall, initial encounter  Elbow laceration, left, initial encounter  Hypotension, unspecified hypotension type      MEDICATIONS GIVEN DURING THIS VISIT:  Medications  acetaminophen (TYLENOL) tablet 1,000 mg (1,000 mg Oral Given 06/20/19 0825)  Tdap (BOOSTRIX) injection 0.5 mL (0.5 mLs Intramuscular Given 06/20/19 1010)  sodium chloride 0.9 % bolus 1,000 mL (0 mLs Intravenous Stopped 06/20/19 1147)  technetium TC 25M diethylenetriame-pentaacetic acid (DTPA) injection 30.3 millicurie (30.3 millicuries Inhalation Given 06/20/19 1321)  technetium albumin aggregated (MAA) injection solution 4 millicurie (4.24 millicuries Intravenous Contrast Given 06/20/19 1346)     ED Discharge Orders    None       Note:  This document was prepared using Dragon voice recognition software and may include unintentional dictation errors.   Concha Se, MD 06/20/19 1610    Concha Se, MD 06/20/19 1544    Concha Se, MD 06/20/19 [phone]

## 2019-06-20 NOTE — ED Notes (Signed)
3x3 mepilex placed onto pt skin tear on rt forearm at this time

## 2019-06-20 NOTE — ED Triage Notes (Signed)
Pt arrives via [facility] from [facility], reports pt was attempting to sit down in chair and miss it and fell. Pt CO lower back pain. Skin tear noted on right forearm. Pt denies any other pain or discomfort.

## 2019-06-20 NOTE — ED Notes (Signed)
Attempted to contact [facility] multiple times for pt ambulatory status at this time, unable to speak to staff at this time due to no answer at memory care station, or receptionist.

## 2019-06-20 NOTE — Discharge Instructions (Addendum)
Patient had an incidental finding on CT scan but at this time she is moving her arms and legs I discussed with both her and the neurosurgery team who recommended no interventions at this time however if she develops progression of not be able to move her arms or legs she should be reevaluated.  She has some derma clips placed onto her left elbow.  These should fall off in 10 days.  We went to discharge patient her blood pressures did drop low.  We gave her 1 L of fluids and her blood pressures came up into the 80s to 100s systolic with maps around 60.  I did discuss with patient's POA and daughter who stated that they would not want further work-up and given that the blood pressures were getting better that the they would prefer her to go back to facility.  If her blood pressures are dropping the facility and the daughter would like further work-up she can return to the ER but this time they declined admission   1. No acute abnormality of the cervical spine. 2. 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 The     [facility] Therapy and Registered Nurse.

## 2019-06-20 NOTE — ED Provider Notes (Signed)
Admit discussed with Dr. Marylu Lund (hospialist)   Sharyn Creamer, MD 06/20/19 [phone]

## 2019-06-20 NOTE — Consult Note (Signed)
PHARMACY -  BRIEF ANTIBIOTIC NOTE   Pharmacy has received consult(s) for cefepime and vancomycin from an ED provider. Patient also ordered metronidazole 500 mg x 1. The patient's profile has been reviewed for ht/wt/allergies/indication/available labs. Patient has allergy to amoxicillin documented in chart (vomiting). Patient has tolerated Unasyn + ceftriaxone in the past.   Patient presenting with chief complaint of fall. Noted to be hypothermic in the ED c/f infection. Blood & urine cultures collected.   One time order(s) placed for  -Cefepime 2 g (already ordered) -Vancomycin 1 g (already ordered)  Further antibiotics/pharmacy consults should be ordered by admitting physician if indicated.                       Thank you, Tressie Ellis  Pharmacy Resident 06/20/2019  4:18 PM

## 2019-06-20 NOTE — H&P (Signed)
History and Physical    Megan Donaldson:[id number] DOB: [birth year] DOA: 06/20/2019  PCP: Almetta Lovely, [facility]  Patient coming from: [facility]   Chief Complaint: Fall  HPI: Megan Donaldson is a 86 y.o. female with medical history significant of Megan Donaldson is a 86 y.o. female with dementia, hypertension, hyperlipidemia, history of PE but is not appear to be on any blood thinners she comes in for a fall.  Patient is coming from [facility].  Patient was attempting to sit down in chair and missed it and fell.    Per ER note patient was complaining of some mild back pain however patient does not tell me she is having any kind of pain, shortness of breath, or any other symptoms.  She does not think she hit her head.   In the ED she was found to be hypotensive and hypothermic.  I spoke to the daughter personally and per daughter temperature is always very low this is not new.  She was empirically started on antibiotics and cultures were drawn. ED Course: Blood pressure 92/50, now 117/67, temperature 34.7 C, started on Bair hugger.  Lactic acid 1.1.  Hemoglobin 9.7.  Hematocrit 30.1.,  Platelets 143.  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  No significant abnormality.  Forearm x-rays negative for fracture.  CT of the head negative for acute abnormality.  No acute abnormality of the cervical spine.  Thoracolumbar spine no acute fracture. Review of Systems: All systems reviewed and otherwise negative.    Past Medical History:  Diagnosis Date  . CHF (congestive heart failure) (HCC)   . Dementia (HCC)   . GERD (gastroesophageal reflux disease)   . Hyperlipidemia   . Hypertension   . Osteoporosis   . Personal history of other venous thrombosis and embolism   . Personal history of PE (pulmonary embolism)     Past Surgical History:  Procedure Laterality Date  . ESOPHAGOGASTRODUODENOSCOPY N/A 02/02/2018     Procedure: ESOPHAGOGASTRODUODENOSCOPY (EGD);  Surgeon: Toledo, Boykin Nearing, MD;  Location: [facility] ENDOSCOPY;  Service: Gastroenterology;  Laterality: N/A;  . HIP CLOSED REDUCTION Right 12/21/2014   Procedure: CLOSED REDUCTION HIP;  Surgeon: Deeann Saint, MD;  Location: [facility] ORS;  Service: Orthopedics;  Laterality: Right;  . HIP CLOSED REDUCTION Right 09/01/2018   Procedure: CLOSED REDUCTION HIP;  Surgeon: Donato Heinz, MD;  Location: [facility] ORS;  Service: Orthopedics;  Laterality: Right;  . TOTAL HIP ARTHROPLASTY Right    x3     reports that she has never smoked. She has never used smokeless tobacco. She reports that she does not drink alcohol or use drugs.  Allergies  Allergen Reactions  . Amoxicillin     Has patient had a PCN reaction causing immediate rash, facial/tongue/throat swelling, SOB or lightheadedness with hypotension: Unknown Has patient had a PCN reaction causing severe rash involving mucus membranes or skin necrosis: Unknown Has patient had a PCN reaction that required hospitalization: Unknown Has patient had a PCN reaction occurring within the last 10 years: Unknown If all of the above answers are "NO", then may proceed with Cephalosporin use.   . Latex   . Lisinopril   . Mometasone Furoate     Family History  Problem Relation Age of Onset  . CAD Father      Prior to Admission medications   Medication Sig Start Date End Date Taking? Authorizing Provider  acetaminophen (TYLENOL) 500 MG  tablet Take 1,000 mg by mouth 2 (two) times daily.    Yes [provider]  aspirin EC 81 MG tablet Take 81 mg by mouth daily.   Yes [provider]  Calcium Carbonate-Vitamin D3 (CALCIUM 600-D) 600-400 MG-UNIT TABS Take 1 tablet by mouth 2 (two) times daily.   Yes [provider]  cephALEXin (KEFLEX) 500 MG capsule Take 500 mg by mouth 2 (two) times daily. 06/16/19 06/23/19 Yes [provider]  escitalopram (LEXAPRO) 10 MG tablet Take 10 mg by mouth  daily. 07/21/17  Yes [provider]  feeding supplement, ENSURE ENLIVE, (ENSURE ENLIVE) LIQD Take 237 mLs by mouth 2 (two) times daily between meals. 01/07/19  Yes Enedina FinnerPatel, Sona, MD  Ferrous Sulfate 142 (45 Fe) MG TBCR Take 45 mg by mouth daily.   Yes [provider]  furosemide (LASIX) 20 MG tablet Take 1 tablet (20 mg total) by mouth every other day. Patient taking differently: Take 20 mg by mouth daily.  01/07/19  Yes Enedina FinnerPatel, Sona, MD  lamoTRIgine (LAMICTAL) 25 MG tablet Take 37.5 mg by mouth daily.    Yes [provider]  lamoTRIgine (LAMICTAL) 25 MG tablet Take 50 mg by mouth at bedtime.   Yes [provider]  levothyroxine (SYNTHROID, LEVOTHROID) 75 MCG tablet Take 75 mcg by mouth daily.   Yes [provider]  melatonin 3 MG TABS tablet Take 3 mg by mouth at bedtime.   Yes [provider]  Multiple Vitamin (MULTIVITAMIN WITH MINERALS) TABS tablet Take 1 tablet by mouth daily. 01/08/19  Yes Enedina FinnerPatel, Sona, MD  nystatin cream (MYCOSTATIN) Apply 1 application topically 2 (two) times daily as needed for dry skin. (apply to skin folds)   Yes [provider]  pantoprazole (PROTONIX) 40 MG tablet Take 40 mg by mouth daily.    Yes [provider]  senna-docusate (SENOKOT-S) 8.6-50 MG tablet Take 2 tablets by mouth at bedtime.   Yes [provider]  simvastatin (ZOCOR) 40 MG tablet Take 40 mg by mouth at bedtime. 05/04/17  Yes [provider]  traZODone (DESYREL) 50 MG tablet Take 2 tablets (100 mg total) by mouth at bedtime as needed for sleep. 01/07/19  Yes Enedina FinnerPatel, Sona, MD  white petrolatum (VASELINE) GEL Apply 1 application topically 3 (three) times daily as needed for lip care.    Yes [provider]  neomycin-bacitracin-polymyxin (NEOSPORIN) OINT Apply 1 application topically daily. (apply to left leg)    [provider]  Polyethylene Glycol 3350 (PEG 3350) 17 g PACK Take 17 g by mouth daily as  needed (moderate constipation).     [provider]    Physical Exam: Vitals:   06/20/19 1515 06/20/19 1530 06/20/19 1545 06/20/19 1556  BP: (!) 108/54 (!) 111/58 (!) 111/58   Pulse: (!) 58 (!) 58 60   Resp: 14 11 12    Temp:    (!) 94.4 F (34.7 C)  TempSrc:    Rectal  SpO2: 96% 94% 96%   Weight:      Height:        Constitutional: NAD, calm, comfortable Vitals:   06/20/19 1515 06/20/19 1530 06/20/19 1545 06/20/19 1556  BP: (!) 108/54 (!) 111/58 (!) 111/58   Pulse: (!) 58 (!) 58 60   Resp: 14 11 12    Temp:    (!) 94.4 F (34.7 C)  TempSrc:    Rectal  SpO2: 96% 94% 96%   Weight:      Height:  Eyes: Anicteric sclera ENMT: Mask on Neck: normal, supple Respiratory: clear to auscultation bilaterally, no wheezing, no crackles. Normal respiratory effort. No accessory muscle use.  Cardiovascular: Regular rate and rhythm, no murmurs / rubs / gallops. No extremity edema.  Abdomen: no tenderness, no masses palpated.Bowel sounds positive.  Musculoskeletal: no clubbing / cyanosis.  Skin: Warm dry Neurologic: CN 2-12 grossly intact.  Psychiatric: Normal judgment and insight. Alert and oriented oriented to person and place only. Normal mood.    Labs on Admission: I have personally reviewed following labs and imaging studies  CBC: Recent Labs  Lab 06/20/19 0827 06/20/19 1049  WBC 8.8  --   NEUTROABS 6.6  --   HGB 10.0* 9.7*  HCT 30.9* 30.1*  MCV 93.4  --   PLT 143*  --    Basic Metabolic Panel: Recent Labs  Lab 06/20/19 0827  NA 139  K 3.9  CL 104  CO2 27  GLUCOSE 97  BUN 23  CREATININE 1.63*  CALCIUM 9.3   GFR: Estimated Creatinine Clearance: 25.3 mL/min (A) (by C-G formula based on SCr of 1.63 mg/dL (H)). Liver Function Tests: No results for input(s): AST, ALT, ALKPHOS, BILITOT, PROT, ALBUMIN in the last 168 hours. No results for input(s): LIPASE, AMYLASE in the last 168 hours. No results for input(s): AMMONIA in the last 168  hours. Coagulation Profile: Recent Labs  Lab 06/20/19 1049  INR 0.9   Cardiac Enzymes: No results for input(s): CKTOTAL, CKMB, CKMBINDEX, TROPONINI in the last 168 hours. BNP (last 3 results) No results for input(s): PROBNP in the last 8760 hours. HbA1C: No results for input(s): HGBA1C in the last 72 hours. CBG: No results for input(s): GLUCAP in the last 168 hours. Lipid Profile: No results for input(s): CHOL, HDL, LDLCALC, TRIG, CHOLHDL, LDLDIRECT in the last 72 hours. Thyroid Function Tests: No results for input(s): TSH, T4TOTAL, FREET4, T3FREE, THYROIDAB in the last 72 hours. Anemia Panel: No results for input(s): VITAMINB12, FOLATE, FERRITIN, TIBC, IRON, RETICCTPCT in the last 72 hours. Urine analysis:    Component Value Date/Time   COLORURINE YELLOW (A) 06/20/2019 1118   APPEARANCEUR CLEAR (A) 06/20/2019 1118   APPEARANCEUR Hazy 10/25/2011 1219   LABSPEC 1.010 06/20/2019 1118   LABSPEC 1.023 10/25/2011 1219   PHURINE 7.0 06/20/2019 1118   GLUCOSEU NEGATIVE 06/20/2019 1118   GLUCOSEU Negative 10/25/2011 1219   HGBUR NEGATIVE 06/20/2019 1118   BILIRUBINUR NEGATIVE 06/20/2019 1118   BILIRUBINUR Negative 10/25/2011 1219   KETONESUR NEGATIVE 06/20/2019 1118   PROTEINUR NEGATIVE 06/20/2019 1118   NITRITE NEGATIVE 06/20/2019 1118   LEUKOCYTESUR NEGATIVE 06/20/2019 1118   LEUKOCYTESUR 1+ 10/25/2011 1219    Radiological Exams on Admission: DG Elbow Complete Left  Result Date: 06/20/2019 CLINICAL DATA:  Pain following fall EXAM: LEFT ELBOW - COMPLETE 3+ VIEW COMPARISON:  None. FINDINGS: Frontal, lateral, and bilateral oblique views were obtained. No evident fracture or dislocation. No joint effusion. There is narrowing of the elbow joint without erosive change. Focus of calcification is noted adjacent to the lateral distal humeral condyle. IMPRESSION: Osteoarthritic change. Probable calcific tendinosis laterally. No fracture, dislocation, or joint effusion. Electronically  Signed   By: Bretta Bang III M.D.   On: 06/20/2019 09:08   DG Forearm Right  Result Date: 06/20/2019 CLINICAL DATA:  Right wrist and forearm pain secondary to a fall this morning. EXAM: RIGHT FOREARM - 2 VIEW COMPARISON:  Radiographs dated 11/10/2018 FINDINGS: There is no fracture or dislocation. Arthritic changes at the elbow possible calcified  loose body in the joint. Osteophyte on the radial head. IMPRESSION: No acute abnormality. Electronically Signed   By: Lorriane Shire M.D.   On: 06/20/2019 08:10   DG Wrist Complete Right  Result Date: 06/20/2019 CLINICAL DATA:  Acute right wrist pain after fall today. EXAM: RIGHT WRIST - COMPLETE 3+ VIEW COMPARISON:  None. FINDINGS: There is no evidence of fracture or dislocation. Severe degenerative changes seen involving the first carpometacarpal joint. Soft tissues are unremarkable. IMPRESSION: Severe degenerative joint disease of the first carpometacarpal joint. No acute abnormality seen in the right wrist. Electronically Signed   By: Marijo Conception M.D.   On: 06/20/2019 08:07   CT Head Wo Contrast  Result Date: 06/20/2019 CLINICAL DATA:  Headache after head trauma secondary to a fall this morning. EXAM: CT HEAD WITHOUT CONTRAST TECHNIQUE: Contiguous axial images were obtained from the base of the skull through the vertex without intravenous contrast. COMPARISON:  CT scan of the head dated 09/17/2017 FINDINGS: Brain: No evidence of acute infarction, hemorrhage, hydrocephalus, extra-axial collection or mass lesion/mass effect. Mild atrophy. Extensive periventricular white matter lucency consistent with chronic small vessel ischemic disease, stable. Vascular: No hyperdense vessel or unexpected calcification. Skull: Normal. Negative for fracture or focal lesion. Sinuses/Orbits: Normal. Other: None IMPRESSION: No acute abnormality. Stable atrophy with extensive chronic small vessel ischemic disease. Electronically Signed   By: Lorriane Shire M.D.   On:  06/20/2019 08:12   CT Cervical Spine Wo Contrast  Result Date: 06/20/2019 CLINICAL DATA:  Trauma secondary to a fall this morning. EXAM: CT CERVICAL SPINE WITHOUT CONTRAST TECHNIQUE: Multidetector CT imaging of the cervical spine was performed without intravenous contrast. Multiplanar CT image reconstructions were also generated. COMPARISON:  CT scan dated 02/11/2010 FINDINGS: Alignment: 2 mm spondylolisthesis of C7 on T1. Skull base and vertebrae: No fracture. The C4-5 level is ankylosed. 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 This has enlarged appreciably since the prior CT scan. No visible canal hematoma. No prevertebral soft tissue swelling. Disc levels: There is diffuse disc space narrowing throughout the cervical spine without visible disc protrusion. No significant foraminal stenoses. Moderate right facet arthritis at C2-3. Ankylosis of the right facet joint and vertebral bodies at C4-5. Moderate bilateral facet arthritis at C7-T1. The facet joints at C7-T1 may be ankylosed. Upper chest: Negative. Other: None IMPRESSION: 1. No acute abnormality of the cervical spine. 2. 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 The Electronically Signed   By: Lorriane Shire M.D.   On: 06/20/2019 08:19   CT Thoracic Spine Wo Contrast  Result Date: 06/20/2019 CLINICAL DATA:  Fall from standing EXAM: CT THORACIC AND LUMBAR SPINE WITHOUT CONTRAST TECHNIQUE: Multidetector CT imaging of the thoracic and lumbar spine was performed without contrast. Multiplanar CT image reconstructions were also generated. COMPARISON:  None. FINDINGS: CT THORACIC SPINE FINDINGS Alignment:  Anteroposterior alignment is maintained. Vertebrae: There is no acute fracture or compression deformity identified. No suspicious osseous lesion. Paraspinal and other soft tissues: Hiatal hernia. Bibasilar atelectasis. Aortic atherosclerosis. Disc levels: Multilevel degenerative changes with disc space narrowing, endplate osteophytes and irregularity, vacuum disc phenomenon, and facet hypertrophy. There is no high-grade osseous encroachment on the  spinal canal. CT LUMBAR SPINE FINDINGS Segmentation: 5 lumbar type vertebrae. Alignment: Levoscoliosis.  Trace retrolisthesis at L2-L3. Vertebrae: No acute fracture or compression deformity. No suspicious osseous lesion. Paraspinal and other soft tissues: Subcentimeter nonobstructing right upper pole renal collecting system calculus. Aortic atherosclerosis. Disc levels: Multilevel degenerative changes are present with disc space narrowing, endplate osteophytes and irregularity, and facet hypertrophy. There is no high-grade osseous encroachment on the spinal canal. IMPRESSION: No acute fracture of the thoracolumbar spine. Electronically Signed   By: Guadlupe Spanish M.D.   On: 06/20/2019 08:15   CT Lumbar Spine Wo Contrast  Result Date: 06/20/2019 CLINICAL DATA:  Fall from standing EXAM: CT THORACIC AND LUMBAR SPINE WITHOUT CONTRAST TECHNIQUE: Multidetector CT imaging of the thoracic and lumbar spine was performed without contrast. Multiplanar CT image reconstructions were also generated. COMPARISON:  None. FINDINGS: CT THORACIC SPINE FINDINGS Alignment: Anteroposterior alignment is maintained. Vertebrae: There is no acute fracture or compression deformity identified. No suspicious osseous lesion. Paraspinal and other soft tissues: Hiatal hernia. Bibasilar atelectasis. Aortic atherosclerosis. Disc levels: Multilevel degenerative changes with disc space narrowing, endplate osteophytes and irregularity, vacuum disc phenomenon, and facet hypertrophy. There is no high-grade  osseous encroachment on the spinal canal. CT LUMBAR SPINE FINDINGS Segmentation: 5 lumbar type vertebrae. Alignment: Levoscoliosis.  Trace retrolisthesis at L2-L3. Vertebrae: No acute fracture or compression deformity. No suspicious osseous lesion. Paraspinal and other soft tissues: Subcentimeter nonobstructing right upper pole renal collecting system calculus. Aortic atherosclerosis. Disc levels: Multilevel degenerative changes are present with disc space narrowing, endplate osteophytes and irregularity, and facet hypertrophy. There is no high-grade osseous encroachment on the spinal canal. IMPRESSION: No acute fracture of the thoracolumbar spine. Electronically Signed   By: Guadlupe Spanish M.D.   On: 06/20/2019 08:15   NM PULMONARY VENT AND PERF (V/Q Scan)  Result Date: 06/20/2019 CLINICAL DATA:  Shortness of breath EXAM: NUCLEAR MEDICINE VENTILATION - PERFUSION LUNG SCAN VIEWS: Anterior, posterior, left lateral, right lateral, RPO, LPO, RAO, LAO-ventilation perfusion RADIOPHARMACEUTICALS:  30.3 mCi of Tc-80m DTPA aerosol inhalation and 4.24 mCi Tc85m MAA IV COMPARISON:  Chest radiograph Jun 20, 2019 FINDINGS: Ventilation: Radiotracer uptake is homogeneous and symmetric. No evident ventilation defects. Perfusion: There is a subsegmental defect in the left apex. No other perfusion defects are evident. Radiotracer otherwise is homogeneous and symmetric bilaterally. IMPRESSION: There is a single subsegmental perfusion defect in the left apex without corresponding chest radiographic or ventilation abnormality. The perfusion study is otherwise unremarkable. This study is felt to constitute a low probability of pulmonary embolus. Electronically Signed   By: Bretta Bang III M.D.   On: 06/20/2019 14:28   DG Chest Portable 1 View  Result Date: 06/20/2019 CLINICAL DATA:  Shortness of breath. EXAM: PORTABLE CHEST 1 VIEW COMPARISON:  Chest x-ray 01/26/2019 and chest CT dated 04/21/2010 FINDINGS: The heart size and  pulmonary vascularity are normal. No infiltrates or effusions. Minimal linear atelectasis in the right midzone secondary to a shallow inspiration. No appreciable effusions. No acute bone abnormality. 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 IMPRESSION: No significant abnormality of the chest. 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 Electronically Signed   By: Francene Boyers M.D.   On: 06/20/2019 08:08    EKG: Independently reviewed.  Poor EKG, normal sinus rhythm  Assessment/Plan Active Problems:   * No active hospital problems. *    S/p mechanical fall- no fx on imaging.  Hypothermia- per daughter she is  always 34 or 35 c degrees, this is not new Will continue bear huggers  Hypotension- on arrival.  Not on bp meds Improved. Will give gentle hydration Being r/o for infection/sepsis- although lactic acid negative.  No leukocytosis. Continue empiric antibiotics until cultures return  Thrombocytopenia- mild No bleed Hold a/c ppx Monitor levels  DVT prophylaxis: SCD Code Status: DNR confirmed with daughter Family Communication: Spoke to daughter Disposition Plan: Back to [facility] Consults called: None Admission status: Observation as patient requires less than 2 midnight stays   Lynn Ito MD [facility] Pager [phone]   If 7PM-7AM, please contact night-coverage [email or web address] Password [email or web address]  06/20/2019, 4:55 PM

## 2019-06-20 NOTE — ED Notes (Signed)
Pt being transported for vq scan at this time

## 2019-06-20 NOTE — ED Notes (Signed)
This tech and [place], NT assisted pt with standing beside the bed. Pt took five steps using a walker.

## 2019-06-20 NOTE — ED Notes (Signed)
bair hugger applied to pt at this time

## 2019-06-21 DIAGNOSIS — Z96641 Presence of right artificial hip joint: Secondary | ICD-10-CM | POA: Diagnosis present

## 2019-06-21 DIAGNOSIS — Z23 Encounter for immunization: Secondary | ICD-10-CM | POA: Diagnosis present

## 2019-06-21 DIAGNOSIS — Z79890 Hormone replacement therapy (postmenopausal): Secondary | ICD-10-CM | POA: Diagnosis not present

## 2019-06-21 DIAGNOSIS — R531 Weakness: Secondary | ICD-10-CM | POA: Diagnosis present

## 2019-06-21 DIAGNOSIS — Z86711 Personal history of pulmonary embolism: Secondary | ICD-10-CM | POA: Diagnosis not present

## 2019-06-21 DIAGNOSIS — E039 Hypothyroidism, unspecified: Secondary | ICD-10-CM | POA: Diagnosis present

## 2019-06-21 DIAGNOSIS — N179 Acute kidney failure, unspecified: Secondary | ICD-10-CM | POA: Diagnosis present

## 2019-06-21 DIAGNOSIS — I509 Heart failure, unspecified: Secondary | ICD-10-CM | POA: Diagnosis present

## 2019-06-21 DIAGNOSIS — I110 Hypertensive heart disease with heart failure: Secondary | ICD-10-CM | POA: Diagnosis present

## 2019-06-21 DIAGNOSIS — G309 Alzheimer's disease, unspecified: Secondary | ICD-10-CM | POA: Diagnosis not present

## 2019-06-21 DIAGNOSIS — E785 Hyperlipidemia, unspecified: Secondary | ICD-10-CM | POA: Diagnosis present

## 2019-06-21 DIAGNOSIS — Z20822 Contact with and (suspected) exposure to covid-19: Secondary | ICD-10-CM | POA: Diagnosis present

## 2019-06-21 DIAGNOSIS — D649 Anemia, unspecified: Secondary | ICD-10-CM | POA: Diagnosis present

## 2019-06-21 DIAGNOSIS — F0390 Unspecified dementia without behavioral disturbance: Secondary | ICD-10-CM

## 2019-06-21 DIAGNOSIS — I9589 Other hypotension: Secondary | ICD-10-CM | POA: Diagnosis not present

## 2019-06-21 DIAGNOSIS — Z66 Do not resuscitate: Secondary | ICD-10-CM | POA: Diagnosis present

## 2019-06-21 DIAGNOSIS — M545 Low back pain: Secondary | ICD-10-CM | POA: Diagnosis present

## 2019-06-21 DIAGNOSIS — Z7982 Long term (current) use of aspirin: Secondary | ICD-10-CM | POA: Diagnosis not present

## 2019-06-21 DIAGNOSIS — I959 Hypotension, unspecified: Secondary | ICD-10-CM | POA: Diagnosis present

## 2019-06-21 DIAGNOSIS — K219 Gastro-esophageal reflux disease without esophagitis: Secondary | ICD-10-CM | POA: Diagnosis present

## 2019-06-21 DIAGNOSIS — W1830XA Fall on same level, unspecified, initial encounter: Secondary | ICD-10-CM | POA: Diagnosis present

## 2019-06-21 DIAGNOSIS — T68XXXD Hypothermia, subsequent encounter: Secondary | ICD-10-CM | POA: Diagnosis not present

## 2019-06-21 DIAGNOSIS — M810 Age-related osteoporosis without current pathological fracture: Secondary | ICD-10-CM | POA: Diagnosis present

## 2019-06-21 DIAGNOSIS — S51012A Laceration without foreign body of left elbow, initial encounter: Secondary | ICD-10-CM | POA: Diagnosis present

## 2019-06-21 DIAGNOSIS — Z8249 Family history of ischemic heart disease and other diseases of the circulatory system: Secondary | ICD-10-CM | POA: Diagnosis not present

## 2019-06-21 DIAGNOSIS — M25531 Pain in right wrist: Secondary | ICD-10-CM | POA: Diagnosis present

## 2019-06-21 DIAGNOSIS — Z79899 Other long term (current) drug therapy: Secondary | ICD-10-CM | POA: Diagnosis not present

## 2019-06-21 DIAGNOSIS — E861 Hypovolemia: Secondary | ICD-10-CM | POA: Diagnosis not present

## 2019-06-21 DIAGNOSIS — I728 Aneurysm of other specified arteries: Secondary | ICD-10-CM | POA: Diagnosis present

## 2019-06-21 LAB — BASIC METABOLIC PANEL
Anion gap: 8 (ref 5–15)
BUN: 22 mg/dL (ref 8–23)
CO2: 24 mmol/L (ref 22–32)
Calcium: 8.8 mg/dL — ABNORMAL LOW (ref 8.9–10.3)
Chloride: 107 mmol/L (ref 98–111)
Creatinine, Ser: 1.47 mg/dL — ABNORMAL HIGH (ref 0.44–1.00)
GFR calc Af Amer: 36 mL/min — ABNORMAL LOW (ref >60)
GFR calc non Af Amer: 31 mL/min — ABNORMAL LOW (ref >60)
Glucose, Bld: 74 mg/dL (ref 70–99)
Potassium: 4.1 mmol/L (ref 3.5–5.1)
Sodium: 139 mmol/L (ref 135–145)

## 2019-06-21 LAB — CBC
HCT: 24.4 % — ABNORMAL LOW (ref 36.0–46.0)
Hemoglobin: 7.9 g/dL — ABNORMAL LOW (ref 12.0–15.0)
MCH: 30.4 pg (ref 26.0–34.0)
MCHC: 32.4 g/dL (ref 30.0–36.0)
MCV: 93.8 fL (ref 80.0–100.0)
Platelets: 151 10*3/uL (ref 150–400)
RBC: 2.6 MIL/uL — ABNORMAL LOW (ref 3.87–5.11)
RDW: 16.7 % — ABNORMAL HIGH (ref 11.5–15.5)
WBC: 6.4 10*3/uL (ref 4.0–10.5)
nRBC: 0 % (ref 0.0–0.2)

## 2019-06-21 MED ORDER — VANCOMYCIN HCL 500 MG/100ML IV SOLN
500.0000 mg | Freq: Once | INTRAVENOUS | Status: AC
  Administered 2019-06-21: 03:00:00 500 mg via INTRAVENOUS
  Filled 2019-06-21: qty 100

## 2019-06-21 MED ORDER — ORAL CARE MOUTH RINSE
15.0000 mL | Freq: Two times a day (BID) | OROMUCOSAL | Status: DC
  Administered 2019-06-21 – 2019-06-24 (×6): 15 mL via OROMUCOSAL

## 2019-06-21 MED ORDER — VANCOMYCIN VARIABLE DOSE PER UNSTABLE RENAL FUNCTION (PHARMACIST DOSING): Status: DC

## 2019-06-21 MED ORDER — SODIUM CHLORIDE 0.9 % IV SOLN
2.0000 g | INTRAVENOUS | Status: DC
  Filled 2019-06-21: qty 2

## 2019-06-21 MED ORDER — METRONIDAZOLE IN NACL 5-0.79 MG/ML-% IV SOLN
500.0000 mg | Freq: Three times a day (TID) | INTRAVENOUS | Status: DC
  Administered 2019-06-21: 02:00:00 500 mg via INTRAVENOUS
  Filled 2019-06-21 (×2): qty 100

## 2019-06-21 NOTE — Evaluation (Signed)
Occupational Therapy Evaluation Patient Details Name: Megan Donaldson MRN: [id number] DOB: 18-Sep-1927 Today's Date: 06/21/2019    History of Present Illness Pt is an 86 y/o F with PMH: advanced dementia (possibly vascular), CHF, HTN, HLD, GERD, osteoporosis, hx of DVT/PE, and a bleeding ulcer. Pt presented after a fall at [facility] and then was found to be hypotensive and hypothermic in the emergency department. Upon speaking with pt's daughter, she endorses that pt has had several falls where she just "missed the chair".   Clinical Impression   Pt was seen for OT evaluation this date. Prior to hospital admission, pt was MOD I with fxl mobility with 4WW and required assist from aide 3x/wk with ADLs including bathing and dressing. Pt lives in ALF with assist for med mgt and meals. Currently pt demonstrates impairments as described below (See OT problem list) which functionally limit her ability to perform ADL/self-care tasks. Pt currently requires MIN/MOD A with STS t/f's and MOD A with SPS with RW. Pt requires MAX to TOTAL A with LB ADLs and MIN A with UB ADLs including self-feeding.  Pt would benefit from skilled OT to address noted impairments and functional limitations (see below for any additional details) in order to maximize safety and independence while minimizing falls risk and caregiver burden. Upon hospital discharge, recommend STR to maximize pt safety and return to PLOF. Of note: pt's BP on initial sup to sit 118/66, after sitting 2-3 mins, dropped to 96/60. LEs elevated and ankle pumps encouraged. Pt's dtr reports to this author that pt's BP runs low chronically.    Follow Up Recommendations  SNF    Equipment Recommendations  3 in 1 bedside commode;Tub/shower seat    Recommendations for Other Services       Precautions / Restrictions Precautions Precautions: Fall Restrictions Weight Bearing Restrictions: No      Mobility Bed Mobility Overal bed mobility: Needs  Assistance Bed Mobility: Supine to Sit     Supine to sit: Mod assist        Transfers Overall transfer level: Needs assistance Equipment used: Rolling walker (2 wheeled) Transfers: Sit to/from [employer] Sit to Stand: Min assist;Mod assist Stand pivot transfers: Mod assist            Balance Overall balance assessment: Needs assistance Sitting-balance support: Single extremity supported;Feet supported Sitting balance-Leahy Scale: Fair     Standing balance support: Bilateral upper extremity supported Standing balance-Leahy Scale: Poor Standing balance comment: requires UE support and MIN/MOD A throughout for standing balance                           ADL either performed or assessed with clinical judgement   ADL Overall ADL's : Needs assistance/impaired Eating/Feeding: Minimal assistance;Sitting   Grooming: Wash/dry hands;Wash/dry face;Oral care;Minimal assistance;Sitting           Upper Body Dressing : Moderate assistance;Sitting   Lower Body Dressing: Maximal assistance;Total assistance;Sitting/lateral leans   Toilet Transfer: Minimal assistance;Moderate assistance;Stand-pivot;BSC   Toileting- Clothing Manipulation and Hygiene: Maximal assistance;Sit to/from stand               Vision Patient Visual Report: No change from baseline       Perception     Praxis      Pertinent Vitals/Pain Pain Assessment: No/denies pain     Hand Dominance Right   Extremity/Trunk Assessment Upper Extremity Assessment Upper Extremity Assessment: Generalized weakness   Lower Extremity Assessment Lower  Extremity Assessment: Defer to PT evaluation;Generalized weakness       Communication Communication Communication: HOH   Cognition Arousal/Alertness: Awake/alert Behavior During Therapy: WFL for tasks assessed/performed Overall Cognitive Status: History of cognitive impairments - at baseline                                  General Comments: Pt with dementia at baseline, only oriented to self, does eventually say hospital, but also gives other answers for location. In addition, pt with some potential visual hallucinations-mentions a "black net" that sits on her head and "has spiders in it".   General Comments       Exercises Other Exercises Other Exercises: OT facilitates education with pt and pt's daughter re: role of OT and potential need for f/u in HH/OP setting at pt's current residence. Pt's daughter with good understanding and agreeable.   Shoulder Instructions      Home Living Family/patient expects to be discharged to:: Assisted living                             Home Equipment: Walker - 4 wheels   Additional Comments: ambulatory short distances in facility using walker, has PCA 3x/wk to assist in the AM for 5 hours until lunch-helps with feeding, bathing, dressing.      Prior Functioning/Environment Level of Independence: Needs assistance  Gait / Transfers Assistance Needed: pt able to ambulate with 4WW to dining hall at ALF ADL's / Homemaking Assistance Needed: pt had some assistance with bathing, dressing, and toileting from staff/has PCA 3x/wk for 5hrs            OT Problem List: Decreased strength;Decreased range of motion;Decreased activity tolerance;Impaired balance (sitting and/or standing);Decreased knowledge of use of DME or AE;Decreased knowledge of precautions;Decreased safety awareness;Decreased cognition      OT Treatment/Interventions: Self-care/ADL training;Therapeutic exercise;DME and/or AE instruction;Patient/family education;Balance training;Therapeutic activities    OT Goals(Current goals can be found in the care plan section) Acute Rehab OT Goals Patient Stated Goal: Pt's daughter states she would like for pt to fall less. Pt with great difficulty participating in goal setting. OT Goal Formulation: With patient/family Time For Goal Achievement:  07/05/19 Potential to Achieve Goals: Good  OT Frequency: Min 2X/week   Barriers to D/C:            Co-evaluation              AM-PAC OT "6 Clicks" Daily Activity     Outcome Measure Help from another person eating meals?: A Little Help from another person taking care of personal grooming?: A Little Help from another person toileting, which includes using toliet, bedpan, or urinal?: A Lot Help from another person bathing (including washing, rinsing, drying)?: A Lot Help from another person to put on and taking off regular upper body clothing?: A Little Help from another person to put on and taking off regular lower body clothing?: A Lot 6 Click Score: 15   End of Session Equipment Utilized During Treatment: Gait belt;Rolling walker Nurse Communication: Mobility status  Activity Tolerance: Patient tolerated treatment well Patient left: in chair;with call bell/phone within reach;with chair alarm set  OT Visit Diagnosis: Unsteadiness on feet (R26.81);Muscle weakness (generalized) (M62.81);History of falling (Z91.81)                Time: 2947-6546 OT Time Calculation (min): 53 min Charges:  OT General Charges $OT Visit: 1 Visit OT Evaluation $OT Eval Moderate Complexity: 1 Mod OT Treatments $Self Care/Home Management : 23-37 mins $Therapeutic Activity: 8-22 mins  Gerrianne Scale, MS, OTR/L ascom [phone] 06/21/19, 2:34 PM

## 2019-06-21 NOTE — Care Management Obs Status (Signed)
MEDICARE OBSERVATION STATUS NOTIFICATION   Patient Details  Name: Megan Donaldson MRN: [id number] Date of Birth: [birth year]   Medicare Observation Status Notification Given:  Yes    Allayne Butcher, RN 06/21/2019, 12:26 PM

## 2019-06-21 NOTE — Progress Notes (Signed)
PT Cancellation Note  Patient Details Name: Megan Donaldson MRN: [id number] DOB: 1927/07/07   Cancelled Treatment:    Reason Eval/Treat Not Completed: Other (comment). Consult received and chart reviewed. Pt currently heavily sleeping. Awakens to tactile cues but only briefly. Doesn't open eyes. When asked where she was, she replied "I'm chasing a shark." Due to arousal level, will hold evaluation at this time and re-attempt when more awake.   Brighten Orndoff 06/21/2019, 10:33 AM  Elizabeth Palau, PT, DPT [phone]

## 2019-06-21 NOTE — Progress Notes (Signed)
PROGRESS NOTE    Megan Donaldson  TAV:[id number] DOB: Jan 31, 1928 DOA: 06/20/2019 PCP: Almetta Lovely, [facility]    Assessment & Plan:   Active Problems:   Hypotension  Generalized weakness: s/p mechanical fall. no fx on imaging. PT/OT consulted  Hypothermia: per daughter she is always 34 or 35 c degrees, not new. Resolved  Hypotension: on arrival. Resolved. Being r/o for infection/sepsis, no leukocytosis. D/C abxs and will monitor   Thrombocytopenia: resolved  Normocytic anemia: no need for a transfusion at this time. Will continue to monitor   AKI vs CKD: baseline Cr is unknown. Cr is trending down today. Will continue to monitor   Dementia: continue w/ supportive care    DVT prophylaxis: SCDs Code Status: DNR Family Communication: called pt's daughter, [name], no answer so I left a message Disposition Plan: depends on PT/OT recs  Consultants:      Procedures:    Antimicrobials:   Subjective: Pt is oriented to self only.   Objective: Vitals:   06/20/19 2205 06/20/19 2355 06/21/19 0005 06/21/19 0115  BP: 130/69 (!) 112/57    Pulse: 77 70    Resp: 17 17    Temp: 97.7 F (36.5 C) 97.7 F (36.5 C)    TempSrc: Oral Oral    SpO2: 96% 95%    Weight:    89.4 kg  Height:   5\' 3"  (1.6 m) 5\' 5"  (1.651 m)    Intake/Output Summary (Last 24 hours) at 06/21/2019 0744 Last data filed at 06/20/2019 1923 Gross per 24 hour  Intake 1100 ml  Output --  Net 1100 ml   Filed Weights   06/20/19 0641 06/21/19 0115  Weight: 89.4 kg 89.4 kg    Examination:  General exam: Appears calm and comfortable  Respiratory system: Clear to auscultation. No rales, rhonchi Cardiovascular system: S1 & S2+. No rubs, gallops or clicks.  Gastrointestinal system: Abdomen is nondistended, soft and nontender. . Normal bowel sounds heard. Central nervous system: Alert and oriented. Moves all 4 extremities  Psychiatry: Judgement and insight appear abnormal.     Data Reviewed: I have  personally reviewed following labs and imaging studies  CBC: Recent Labs  Lab 06/20/19 0827 06/20/19 1049 06/21/19 0445  WBC 8.8  --  6.4  NEUTROABS 6.6  --   --   HGB 10.0* 9.7* 7.9*  HCT 30.9* 30.1* 24.4*  MCV 93.4  --  93.8  PLT 143*  --  151   Basic Metabolic Panel: Recent Labs  Lab 06/20/19 0827 06/21/19 0445  NA 139 139  K 3.9 4.1  CL 104 107  CO2 27 24  GLUCOSE 97 74  BUN 23 22  CREATININE 1.63* 1.47*  CALCIUM 9.3 8.8*   GFR: Estimated Creatinine Clearance: 28.7 mL/min (A) (by C-G formula based on SCr of 1.47 mg/dL (H)). Liver Function Tests: No results for input(s): AST, ALT, ALKPHOS, BILITOT, PROT, ALBUMIN in the last 168 hours. No results for input(s): LIPASE, AMYLASE in the last 168 hours. No results for input(s): AMMONIA in the last 168 hours. Coagulation Profile: Recent Labs  Lab 06/20/19 1049  INR 0.9   Cardiac Enzymes: No results for input(s): CKTOTAL, CKMB, CKMBINDEX, TROPONINI in the last 168 hours. BNP (last 3 results) No results for input(s): PROBNP in the last 8760 hours. HbA1C: No results for input(s): HGBA1C in the last 72 hours. CBG: No results for input(s): GLUCAP in the last 168 hours. Lipid Profile: No results for input(s): CHOL, HDL, LDLCALC, TRIG, CHOLHDL, LDLDIRECT in  the last 72 hours. Thyroid Function Tests: Recent Labs    06/20/19 0833  TSH 14.696*  FREET4 1.04   Anemia Panel: No results for input(s): VITAMINB12, FOLATE, FERRITIN, TIBC, IRON, RETICCTPCT in the last 72 hours. Sepsis Labs: Recent Labs  Lab 06/20/19 0827 06/20/19 1129  PROCALCITON <0.10  --   LATICACIDVEN  --  1.1    Recent Results (from the past 240 hour(s))  Respiratory Panel by RT PCR (Flu A&B, Covid) - Nasopharyngeal Swab     Status: None   Collection Time: 06/20/19 11:27 AM   Specimen: Nasopharyngeal Swab  Result Value Ref Range Status   SARS Coronavirus 2 by RT PCR NEGATIVE NEGATIVE Final    Comment: (NOTE) SARS-CoV-2 target nucleic acids  are NOT DETECTED. The SARS-CoV-2 RNA is generally detectable in upper respiratoy specimens during the acute phase of infection. The lowest concentration of SARS-CoV-2 viral copies this assay can detect is 131 copies/mL. A negative result does not preclude SARS-Cov-2 infection and should not be used as the sole basis for treatment or other patient management decisions. A negative result may occur with  improper specimen collection/handling, submission of specimen other than nasopharyngeal swab, presence of viral mutation(s) within the areas targeted by this assay, and inadequate number of viral copies (<131 copies/mL). A negative result must be combined with clinical observations, patient history, and epidemiological information. The expected result is Negative. Fact Sheet for Patients:  https://www.moore.com/ Fact Sheet for Healthcare Providers:  https://www.young.biz/ This test is not yet ap proved or cleared by the [place] FDA and  has been authorized for detection and/or diagnosis of SARS-CoV-2 by FDA under an Emergency Use Authorization (EUA). This EUA will remain  in effect (meaning this test can be used) for the duration of the COVID-19 declaration under Section 564(b)(1) of the Act, 21 U.S.C. section 360bbb-3(b)(1), unless the authorization is terminated or revoked sooner.    Influenza A by PCR NEGATIVE NEGATIVE Final   Influenza B by PCR NEGATIVE NEGATIVE Final    Comment: (NOTE) The Xpert Xpress SARS-CoV-2/FLU/RSV assay is intended as an aid in  the diagnosis of influenza from Nasopharyngeal swab specimens and  should not be used as a sole basis for treatment. Nasal washings and  aspirates are unacceptable for Xpert Xpress SARS-CoV-2/FLU/RSV  testing. Fact Sheet for Patients: https://www.moore.com/ Fact Sheet for Healthcare Providers: https://www.young.biz/ This test is not yet approved or  cleared by the [place] FDA and  has been authorized for detection and/or diagnosis of SARS-CoV-2 by  FDA under an Emergency Use Authorization (EUA). This EUA will remain  in effect (meaning this test can be used) for the duration of the  Covid-19 declaration under Section 564(b)(1) of the Act, 21  U.S.C. section 360bbb-3(b)(1), unless the authorization is  terminated or revoked. Performed at [facility], [address], [place], [place] [address]          Radiology Studies: DG Elbow Complete Left  Result Date: 06/20/2019 CLINICAL DATA:  Pain following fall EXAM: LEFT ELBOW - COMPLETE 3+ VIEW COMPARISON:  None. FINDINGS: Frontal, lateral, and bilateral oblique views were obtained. No evident fracture or dislocation. No joint effusion. There is narrowing of the elbow joint without erosive change. Focus of calcification is noted adjacent to the lateral distal humeral condyle. IMPRESSION: Osteoarthritic change. Probable calcific tendinosis laterally. No fracture, dislocation, or joint effusion. Electronically Signed   By: Bretta Bang III M.D.   On: 06/20/2019 09:08   DG Forearm Right  Result Date:  06/20/2019 CLINICAL DATA:  Right wrist and forearm pain secondary to a fall this morning. EXAM: RIGHT FOREARM - 2 VIEW COMPARISON:  Radiographs dated 11/10/2018 FINDINGS: There is no fracture or dislocation. Arthritic changes at the elbow possible calcified loose body in the joint. Osteophyte on the radial head. IMPRESSION: No acute abnormality. Electronically Signed   By: Lorriane Shire M.D.   On: 06/20/2019 08:10   DG Wrist Complete Right  Result Date: 06/20/2019 CLINICAL DATA:  Acute right wrist pain after fall today. EXAM: RIGHT WRIST - COMPLETE 3+ VIEW COMPARISON:  None. FINDINGS: There is no evidence of fracture or dislocation. Severe degenerative changes seen involving the first carpometacarpal joint. Soft tissues are unremarkable. IMPRESSION: Severe degenerative joint  disease of the first carpometacarpal joint. No acute abnormality seen in the right wrist. Electronically Signed   By: Marijo Conception M.D.   On: 06/20/2019 08:07   CT Head Wo Contrast  Result Date: 06/20/2019 CLINICAL DATA:  Headache after head trauma secondary to a fall this morning. EXAM: CT HEAD WITHOUT CONTRAST TECHNIQUE: Contiguous axial images were obtained from the base of the skull through the vertex without intravenous contrast. COMPARISON:  CT scan of the head dated 09/17/2017 FINDINGS: Brain: No evidence of acute infarction, hemorrhage, hydrocephalus, extra-axial collection or mass lesion/mass effect. Mild atrophy. Extensive periventricular white matter lucency consistent with chronic small vessel ischemic disease, stable. Vascular: No hyperdense vessel or unexpected calcification. Skull: Normal. Negative for fracture or focal lesion. Sinuses/Orbits: Normal. Other: None IMPRESSION: No acute abnormality. Stable atrophy with extensive chronic small vessel ischemic disease. Electronically Signed   By: Lorriane Shire M.D.   On: 06/20/2019 08:12   CT Cervical Spine Wo Contrast  Result Date: 06/20/2019 CLINICAL DATA:  Trauma secondary to a fall this morning. EXAM: CT CERVICAL SPINE WITHOUT CONTRAST TECHNIQUE: Multidetector CT imaging of the cervical spine was performed without intravenous contrast. Multiplanar CT image reconstructions were also generated. COMPARISON:  CT scan dated 02/11/2010 FINDINGS: Alignment: 2 mm spondylolisthesis of C7 on T1. Skull base and vertebrae: No fracture. The C4-5 level is ankylosed. 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 This has enlarged appreciably since the prior CT scan. No visible canal hematoma. No prevertebral soft tissue swelling. Disc levels: There is diffuse disc  space narrowing throughout the cervical spine without visible disc protrusion. No significant foraminal stenoses. Moderate right facet arthritis at C2-3. Ankylosis of the right facet joint and vertebral bodies at C4-5. Moderate bilateral facet arthritis at C7-T1. The facet joints at C7-T1 may be ankylosed. Upper chest: Negative. Other: None IMPRESSION: 1. No acute abnormality of the cervical spine. 2. 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 The Electronically Signed   By: Lorriane Shire M.D.   On: 06/20/2019 08:19   CT Thoracic Spine Wo Contrast  Result Date: 06/20/2019 CLINICAL DATA:  Fall from standing EXAM: CT THORACIC AND LUMBAR SPINE WITHOUT CONTRAST TECHNIQUE: Multidetector CT imaging of the thoracic and lumbar spine was performed without contrast. Multiplanar CT image reconstructions were also generated. COMPARISON:  None. FINDINGS: CT THORACIC SPINE FINDINGS Alignment: Anteroposterior alignment is maintained. Vertebrae: There is no acute fracture or compression deformity identified. No suspicious  osseous lesion. Paraspinal and other soft tissues: Hiatal hernia. Bibasilar atelectasis. Aortic atherosclerosis. Disc levels: Multilevel degenerative changes with disc space narrowing, endplate osteophytes and irregularity, vacuum disc phenomenon, and facet hypertrophy. There is no high-grade osseous encroachment on the spinal canal. CT LUMBAR SPINE FINDINGS Segmentation: 5 lumbar type vertebrae. Alignment: Levoscoliosis.  Trace retrolisthesis at L2-L3. Vertebrae: No acute fracture or compression deformity. No suspicious osseous lesion. Paraspinal and other soft tissues: Subcentimeter nonobstructing right upper pole renal collecting system calculus. Aortic atherosclerosis. Disc levels: Multilevel degenerative changes are present with disc space  narrowing, endplate osteophytes and irregularity, and facet hypertrophy. There is no high-grade osseous encroachment on the spinal canal. IMPRESSION: No acute fracture of the thoracolumbar spine. Electronically Signed   By: Guadlupe SpanishPraneil  Patel M.D.   On: 06/20/2019 08:15   CT Lumbar Spine Wo Contrast  Result Date: 06/20/2019 CLINICAL DATA:  Fall from standing EXAM: CT THORACIC AND LUMBAR SPINE WITHOUT CONTRAST TECHNIQUE: Multidetector CT imaging of the thoracic and lumbar spine was performed without contrast. Multiplanar CT image reconstructions were also generated. COMPARISON:  None. FINDINGS: CT THORACIC SPINE FINDINGS Alignment: Anteroposterior alignment is maintained. Vertebrae: There is no acute fracture or compression deformity identified. No suspicious osseous lesion. Paraspinal and other soft tissues: Hiatal hernia. Bibasilar atelectasis. Aortic atherosclerosis. Disc levels: Multilevel degenerative changes with disc space narrowing, endplate osteophytes and irregularity, vacuum disc phenomenon, and facet hypertrophy. There is no high-grade osseous encroachment on the spinal canal. CT LUMBAR SPINE FINDINGS Segmentation: 5 lumbar type vertebrae. Alignment: Levoscoliosis.  Trace retrolisthesis at L2-L3. Vertebrae: No acute fracture or compression deformity. No suspicious osseous lesion. Paraspinal and other soft tissues: Subcentimeter nonobstructing right upper pole renal collecting system calculus. Aortic atherosclerosis. Disc levels: Multilevel degenerative changes are present with disc space narrowing, endplate osteophytes and irregularity, and facet hypertrophy. There is no high-grade osseous encroachment on the spinal canal. IMPRESSION: No acute fracture of the thoracolumbar spine. Electronically Signed   By: Guadlupe SpanishPraneil  Patel M.D.   On: 06/20/2019 08:15   NM PULMONARY VENT AND PERF (V/Q Scan)  Result Date: 06/20/2019 CLINICAL DATA:  Shortness of breath EXAM: NUCLEAR MEDICINE VENTILATION - PERFUSION LUNG  SCAN VIEWS: Anterior, posterior, left lateral, right lateral, RPO, LPO, RAO, LAO-ventilation perfusion RADIOPHARMACEUTICALS:  30.3 mCi of Tc-7617m DTPA aerosol inhalation and 4.24 mCi Tc5017m MAA IV COMPARISON:  Chest radiograph Jun 20, 2019 FINDINGS: Ventilation: Radiotracer uptake is homogeneous and symmetric. No evident ventilation defects. Perfusion: There is a subsegmental defect in the left apex. No other perfusion defects are evident. Radiotracer otherwise is homogeneous and symmetric bilaterally. IMPRESSION: There is a single subsegmental perfusion defect in the left apex without corresponding chest radiographic or ventilation abnormality. The perfusion study is otherwise unremarkable. This study is felt to constitute a low probability of pulmonary embolus. Electronically Signed   By: Bretta BangWilliam  Woodruff III M.D.   On: 06/20/2019 14:28   DG Chest Portable 1 View  Result Date: 06/20/2019 CLINICAL DATA:  Shortness of breath. EXAM: PORTABLE CHEST 1 VIEW COMPARISON:  Chest x-ray 01/26/2019 and chest CT dated 04/21/2010 FINDINGS: The heart size and pulmonary vascularity are normal. No infiltrates or effusions. Minimal linear atelectasis in the right midzone secondary to a shallow inspiration. No appreciable effusions. No acute bone abnormality. 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 IMPRESSION: No significant abnormality of the chest. 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 Electronically Signed   By: Francene BoyersJames  Maxwell M.D.   On: 06/20/2019 08:08  Scheduled Meds: . acetaminophen  1,000 mg Oral BID  . aspirin EC  81 mg Oral Daily  . calcium-vitamin D  1 tablet Oral BID  . feeding supplement (ENSURE ENLIVE)  237 mL Oral BID BM  . ferrous sulfate  325 mg Oral Q breakfast  . lamoTRIgine  50 mg Oral QHS  . levothyroxine  75 mcg Oral Daily  . mouth rinse  15 mL Mouth Rinse BID  . melatonin  5 mg Oral QHS  . multivitamin with  minerals  1 tablet Oral Daily  . neomycin-bacitracin-polymyxin  1 application Topical Daily  . pantoprazole  40 mg Oral Daily  . senna-docusate  2 tablet Oral QHS  . simvastatin  40 mg Oral QHS  . vancomycin variable dose per unstable renal function (pharmacist dosing)   Does not apply See admin instructions   Continuous Infusions: . sodium chloride 50 mL/hr at 06/20/19 2326  . ceFEPime (MAXIPIME) IV    . metronidazole 500 mg (06/21/19 0207)     LOS: 0 days    Time spent: 30 mins    Charise Killian, MD [facility] Pager [phone]  If 7PM-7AM, please contact night-coverage [email or web address] 06/21/2019, 7:44 AM

## 2019-06-21 NOTE — Progress Notes (Signed)
Pharmacy Antibiotic Note  Megan Donaldson is a 86 y.o. female admitted on 06/20/2019 with fall from nursing home, when she missed a chair and fell on the floor, patient hypothermic and hypotensive, but apparently this is patient's baseline per daughter, LA acid WNL, patient is in AKI w/o defined baseline (possibly has CKD from SCr trend). WBC also WNL, CXR clear, UTI unremarkable. Hospitalist would like to continue broad spectrum abx until cultures come back. Pharmacy has been consulted for vanc/cefepime/flagyl dosing.  Plan: Patient has received vanc 1.5g IV load, cefepime 2g, and flagyl 500 IV x 1.  Since patient appears to be in AKI w/ possible concomitant CKD w/o defined baseline, will check a 24 hour random vanc level and dose per levels for now until can get a clear picture of patient's renal function. Goal random for re-dosing < 20 mcg/mL.  Will continue cefepime 2g IV q24h and flagyl 500 mg IV q8h and will continue to monitor and adjust cefepime doses as needed per changes in renal function.  Height: 5\' 5"  (165.1 cm) Weight: 89.4 kg (197 lb 1.5 oz) IBW/kg (Calculated) : 57  Temp (24hrs), Avg:96.8 F (36 C), Min:94.4 F (34.7 C), Max:97.7 F (36.5 C)  Recent Labs  Lab 06/20/19 0827 06/20/19 1129  WBC 8.8  --   CREATININE 1.63*  --   LATICACIDVEN  --  1.1    Estimated Creatinine Clearance: 25.9 mL/min (A) (by C-G formula based on SCr of 1.63 mg/dL (H)).    Allergies  Allergen Reactions  . Amoxicillin     Has patient had a PCN reaction causing immediate rash, facial/tongue/throat swelling, SOB or lightheadedness with hypotension: Unknown Has patient had a PCN reaction causing severe rash involving mucus membranes or skin necrosis: Unknown Has patient had a PCN reaction that required hospitalization: Unknown Has patient had a PCN reaction occurring within the last 10 years: Unknown If all of the above answers are "NO", then may proceed with Cephalosporin use.   . Latex   .  Lisinopril   . Mometasone Furoate     Thank you for allowing pharmacy to be a part of this patient's care.  08/20/19, PharmD, BCPS Clinical Pharmacist 06/21/2019 3:21 AM

## 2019-06-21 NOTE — TOC Initial Note (Signed)
Transition of Care [facility]) - Initial/Assessment Note    Patient Details  Name: Megan Donaldson MRN: [id number] Date of Birth: Nov 22, 1927  Transition of Care [facility]) CM/SW Contact:    Shelbie Hutching, RN Phone Number: 06/21/2019, 12:36 PM  Clinical Narrative:                 Patient placed in observation after a fall at [facility] and then was found to be hypotensive and hypothermic in the emergency department.  Patient's daughter is at the bedside.  RNCM introduced self and explained role.  Daughter would like for the patient to return to [facility].  Patient walks with a walker, daughter reports that patient appears to be at her baseline.  When medically ready the patient will need EMS transport.   Expected Discharge Plan: Assisted Living(with home health services) Barriers to Discharge: Continued Medical Work up   Patient Goals and CMS Choice Patient states their goals for this hospitalization and ongoing recovery are:: to return to [facility].gov Compare Post Acute Care list provided to:: Patient Represenative (must comment) Choice offered to / list presented to : Adult Children(daughter)  Expected Discharge Plan and Services Expected Discharge Plan: Assisted Living(with home health services)   Discharge Planning Services: CM Consult Post Acute Care Choice: Resumption of Svcs/PTA Provider Living arrangements for the past 2 months: Assisted Living Facility                           HH Arranged: RN, PT, OT [facility] Agency: [place] Date [place]: 06/21/19 Time HH Agency Contacted: 54 Representative spoke with at [place]: Sharmon Revere  Prior Living Arrangements/Services Living arrangements for the past 2 months: [place] Lives with:: Facility Resident Patient language and need for interpreter reviewed:: Yes Do you feel safe going back to the place where you live?: Yes      Need for Family Participation in Patient  Care: Yes (Comment)(fall risk, dementia) Care giver support system in place?: Yes (comment)(daughter) Current home services: DME, Homehealth aide, Home PT, Home RN, Patent attorney, home health with [employer]) Criminal Activity/Legal Involvement Pertinent to Current Situation/Hospitalization: No - Comment as needed  Activities of Daily Living Home Assistive Devices/Equipment: Environmental consultant (specify type) ADL Screening (condition at time of admission) Patient's cognitive ability adequate to safely complete daily activities?: No Is the patient deaf or have difficulty hearing?: No Does the patient have difficulty seeing, even when wearing glasses/contacts?: No Does the patient have difficulty concentrating, remembering, or making decisions?: Yes Patient able to express need for assistance with ADLs?: No Does the patient have difficulty dressing or bathing?: Yes Independently performs ADLs?: No Communication: Needs assistance Is this a change from baseline?: Pre-admission baseline Dressing (OT): Needs assistance Is this a change from baseline?: Pre-admission baseline Grooming: Needs assistance Is this a change from baseline?: Pre-admission baseline Feeding: Needs assistance, Dependent Bathing: Needs assistance Is this a change from baseline?: Pre-admission baseline Toileting: Needs assistance Is this a change from baseline?: Pre-admission baseline In/Out Bed: Dependent Is this a change from baseline?: Pre-admission baseline Walks in Home: Needs assistance Is this a change from baseline?: Pre-admission baseline Does the patient have difficulty walking or climbing stairs?: Yes Weakness of Legs: Both Weakness of Arms/Hands: Both  Permission Sought/Granted Permission sought to share information with : Case Manager, Customer service manager, Family Supports Permission granted to share information with : Yes, Verbal Permission Granted  Permission granted to share info w AGENCY: [employer] granted to share info w Relationship: daughter     Emotional Assessment Appearance:: Appears stated age Attitude/Demeanor/Rapport: Engaged Affect (typically observed): Accepting Orientation: : Oriented to Self Alcohol / Substance Use: Not Applicable Psych Involvement: No (comment)  Admission diagnosis:  Hypotension [I95.9] Fall, initial encounter [W19.XXXA] Elbow laceration, left, initial encounter [S51.012A] Hypotension, unspecified hypotension type [I95.9] Patient Active Problem List   Diagnosis Date Noted  . Constipation   . Dementia without behavioral disturbance (HCC)   . Conjunctivitis 01/04/2019  . Acute encephalopathy 01/04/2019  . Acute metabolic encephalopathy 01/03/2019  . Sepsis (HCC) 01/03/2019  . Acute lower UTI 01/03/2019  . Hypotension 01/03/2019  . Multiple open wounds of lower leg 01/03/2019  . Prolonged QT interval 01/03/2019  . Bilateral lower leg cellulitis 11/10/2018  . Acute gastric ulcer with hemorrhage 03/10/2018  . GI bleed 02/01/2018  . Altered mental status 09/17/2017  . Seizure (HCC) 08/07/2017  . 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 Gastroesophageal reflux disease 10/22/2015  . Mild depression (HCC) 10/22/2015  . H/O: GI bleed 07/30/2015  . History of deep venous thrombosis 07/30/2015  . History of pulmonary embolism 07/30/2015  . 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 S/P closed reduction of dislocated total hip prosthesis 03/19/2015  . Essential hypertension 03/23/2014  . Hyperlipidemia 03/23/2014  . Hypothyroidism 03/23/2014   PCP:  Almetta Lovely, [facility] Pharm[facility]:   [facility] - [place], [place] - [address] [place] [address] Phone: [phone] Fax: [phone]  [facility] [phone] Dan [place], [place] - [address] [place] [address] Phone: [phone] Fax: [phone]     Social Determinants of Health  (SDOH) Interventions    Readmission Risk Interventions Readmission Risk Prevention Plan 01/05/2019 11/13/2018  Medication Screening - Complete  Transportation Screening Complete Complete  HRI or Home Care Consult Complete -  Medication Review (RN Care Manager) Complete -  Some recent data might be hidden

## 2019-06-21 NOTE — Progress Notes (Signed)
PT Cancellation Note  Patient Details Name: Megan Donaldson MRN: [id number] DOB: 09-16-1927   Cancelled Treatment:    Reason Eval/Treat Not Completed: Other (comment). Consult re-attempted and pt more awake. Reports she just finished getting back into the bed from being up in the chair most of the day and she is tired. Requests therapist to return tomorrow. Will re-attempt next date.   Rami Waddle 06/21/2019, 3:49 PM  Elizabeth Palau, PT, DPT [phone]

## 2019-06-22 DIAGNOSIS — I9589 Other hypotension: Secondary | ICD-10-CM

## 2019-06-22 DIAGNOSIS — Z86711 Personal history of pulmonary embolism: Secondary | ICD-10-CM

## 2019-06-22 DIAGNOSIS — E861 Hypovolemia: Secondary | ICD-10-CM

## 2019-06-22 DIAGNOSIS — T68XXXA Hypothermia, initial encounter: Secondary | ICD-10-CM | POA: Diagnosis present

## 2019-06-22 LAB — CBC
HCT: 23.1 % — ABNORMAL LOW (ref 36.0–46.0)
Hemoglobin: 7.6 g/dL — ABNORMAL LOW (ref 12.0–15.0)
MCH: 30.4 pg (ref 26.0–34.0)
MCHC: 32.9 g/dL (ref 30.0–36.0)
MCV: 92.4 fL (ref 80.0–100.0)
Platelets: 161 10*3/uL (ref 150–400)
RBC: 2.5 MIL/uL — ABNORMAL LOW (ref 3.87–5.11)
RDW: 16.5 % — ABNORMAL HIGH (ref 11.5–15.5)
WBC: 6.4 10*3/uL (ref 4.0–10.5)
nRBC: 0 % (ref 0.0–0.2)

## 2019-06-22 LAB — BASIC METABOLIC PANEL
Anion gap: 7 (ref 5–15)
BUN: 20 mg/dL (ref 8–23)
CO2: 24 mmol/L (ref 22–32)
Calcium: 8.8 mg/dL — ABNORMAL LOW (ref 8.9–10.3)
Chloride: 109 mmol/L (ref 98–111)
Creatinine, Ser: 1.35 mg/dL — ABNORMAL HIGH (ref 0.44–1.00)
GFR calc Af Amer: 40 mL/min — ABNORMAL LOW (ref >60)
GFR calc non Af Amer: 35 mL/min — ABNORMAL LOW (ref >60)
Glucose, Bld: 85 mg/dL (ref 70–99)
Potassium: 4 mmol/L (ref 3.5–5.1)
Sodium: 140 mmol/L (ref 135–145)

## 2019-06-22 LAB — HEMOGLOBIN AND HEMATOCRIT, BLOOD
HCT: 24.9 % — ABNORMAL LOW (ref 36.0–46.0)
Hemoglobin: 8 g/dL — ABNORMAL LOW (ref 12.0–15.0)

## 2019-06-22 NOTE — Hospital Course (Addendum)
Megan Donaldson is a 86 y.o. female with medical history of dementia, hypertension, hyperlipidemia, history of PE but is not appear to be on any blood thinners, who presented to the ED on 06/20/19 from [facility] after a a fall.  In the ED, she was found to be hypotensive and hypothermic (per family is chronic intermittent issue).  Initial BP was 92/50, temperature 34.7 C, started on [employer]  Lactate normal at 1.1, Hbg 9.7, platelets 143k.  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 UA negative for infection.  Forearm x-rays negative for fracture.  CT of the head negative for acute abnormality.  No acute abnormality of the cervical spine.  Thoracolumbar spine no acute fracture.  She was treated empirically with broad antibiotics in the ED and admitted for further evaluation and management.

## 2019-06-22 NOTE — Progress Notes (Addendum)
PROGRESS NOTE    Megan Donaldson   VZC:[id number]  DOB: 26-Dec-1927  PCP: Almetta Lovely, [facility]    DOA: 06/20/2019 LOS: 1   Brief Narrative   Megan Donaldson is a 86 y.o. female with medical history of dementia, hypertension, hyperlipidemia, history of PE but is not appear to be on any blood thinners, who presented to the ED on 06/20/19 from [facility] after a a fall.  In the ED, she was found to be hypotensive and hypothermic (per family is chronic intermittent issue).  Initial BP was 92/50, temperature 34.7 C, started on [employer]  Lactate normal at 1.1, Hbg 9.7, platelets 143k.  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 UA negative for infection.  Forearm x-rays negative for fracture.  CT of the head negative for acute abnormality.  No acute abnormality of the cervical spine.  Thoracolumbar spine no acute fracture.  She was treated empirically with broad antibiotics in the ED and admitted for further evaluation and management.     Assessment & Plan   Principal Problem:   Hypotension Active Problems:   Generalized weakness   Essential hypertension   History of pulmonary embolism   Hypothermia   Gastroesophageal reflux disease   Hyperlipidemia   Hypothyroidism   Dementia without behavioral disturbance (HCC)   Hypotension - unclear etiology.  Sepsis ruled out.  No infection source on chest xray, UA, blood cultures negative to date, patient afebrile and without leukocytosis.  Her hemoglobin did drop from 10.0 on admission to 7.6 this AM, and she has history of GI bleeding.  Given history of PE, V/Q scan was obtained and low probability of acute PE.  Initially treated with empiric antibiotics, these were stopped and patient remains without signs of infection. --continue holding Lasix  --further mgmt as below  Acute on Chronic Normocytic Anemia - baseline Hbg appears to be around 9.5-10.   Takes iron supplement  outpatient, continued. --repeat H&H this afternoon --anemia panel with AM labs --transfuse if Hbg < 7.0 --FOBT pending --SCD's for VTE ppx --Maintain MAP >= 65 --Stop IV fluids and monitor  Generalized weakness - likely multifactorial with hypotension primary factor.  PT/OT evaluations.  SNF recommended.  Plan to return to [facility] on discharge.    Acute Kidney Injury vs ?CKD - present on admission.  Baseline renal function unknown, but suspect this is pre-renal AKI in setting of hypotension.  Renal function improving.   --renally dose meds as indicated --avoid nephrotoxins and hypotension --monitor BMP daily  Essential hypertension - now with hypotension.  Only appears to be on Lasix outpatient. Hold Lasix.  Monitor BP.    History of pulmonary embolism - low probability V/Q scan.  No hypoxia or tachycardia.  Not on anticoagulation due to history of GI bleeding.  Hypothermia - chronic, intermittent.  Resolved.  Okay to use [employer] as needed.  Gastroesophageal reflux disease - continue Protonix  Hyperlipidemia - continue Zocor  Hypothyroidism - continue Synthroid  History of seizures - continue Lamictal  Dementia without behavioral disturbance - stable, monitor.  Not on medication.   Obesity: Body mass index is 32.8 kg/m.  Complicates overall care and prognosis.  DVT prophylaxis: SCD's  Diet:  Diet Orders (From admission, onward)    Start     Ordered   06/21/19 0345  Diet 2 gram sodium Room service appropriate? Yes; Fluid consistency: Thin  Diet effective now    Question Answer Comment  Room service appropriate? Yes   Fluid consistency: Thin      06/21/19 0344            Code Status: DNR    Subjective 06/22/19    Patient seen at bedside.  No acute events reported.  She denies complaints including fever/chills, pain or discomfort, nausea/vomiting or abdominal pain.  Says she does not know if she has had any bleeding.  Disposition Plan & Communication     Status is: Inpatient  Remains inpatient appropriate because:Patient's hemoglobin dropped and now undergoing evaluation of acute blood loss.   Dispo: The patient is from: SNF              Anticipated d/c is to: SNF              Anticipated d/c date is: 1-2 days              Patient currently is not medically stable to d/c.    Family Communication: daughter updated by phone this afternoon, all questions answered and she is agreeable with plan to d/c patient tomorrow if Hbg and BP stable.   Consults, Procedures, Significant Events   Consultants:   none  Procedures:   none  Antimicrobials:   Empiric on admission: Vanc, Cefepime, Flagyl    Objective   Vitals:   06/21/19 1500 06/21/19 2300 06/22/19 0839 06/22/19 0910  BP: (!) 106/56 (!) 114/51 (!) 159/63 116/65  Pulse: 61 76 77   Resp:  14 15   Temp: 98 F (36.7 C) 97.8 F (36.6 C) 98.7 F (37.1 C)   TempSrc: Oral Oral Rectal   SpO2: 96% 98% 97%   Weight:      Height:        Intake/Output Summary (Last 24 hours) at 06/22/2019 1232 Last data filed at 06/22/2019 0900 Gross per 24 hour  Intake 2157.69 ml  Output 2300 ml  Net -142.31 ml   Filed Weights   06/20/19 0641 06/21/19 0115  Weight: 89.4 kg 89.4 kg    Physical Exam:  General exam: awake, alert, no acute distress HEENT: atraumatic, clear conjunctiva, anicteric sclera, moist mucus membranes, hearing grossly normal  Respiratory system: CTAB, no wheezes, rales or rhonchi, normal respiratory effort. Cardiovascular system: normal S1/S2, RRR, no pedal edema.   Gastrointestinal system: soft, NT, ND, no HSM felt, +bowel sounds. Central nervous system: no gross focal neurologic deficits, normal speech Extremities: no edema, normal tone Psychiatry: normal mood, congruent affect  Labs   Data Reviewed: I have personally reviewed following labs and imaging studies  CBC: Recent Labs  Lab 06/20/19 0827 06/20/19 1049 06/21/19 0445 06/22/19 0456  WBC 8.8   --  6.4 6.4  NEUTROABS 6.6  --   --   --   HGB 10.0* 9.7* 7.9* 7.6*  HCT 30.9* 30.1* 24.4* 23.1*  MCV 93.4  --  93.8 92.4  PLT 143*  --  151 161   Basic Metabolic Panel: Recent Labs  Lab 06/20/19 0827 06/21/19 0445 06/22/19 0456  NA 139 139 140  K 3.9 4.1 4.0  CL 104 107 109  CO2 27 24 24   GLUCOSE 97 74 85  BUN 23 22 20   CREATININE 1.63* 1.47* 1.35*  CALCIUM 9.3 8.8* 8.8*   GFR: Estimated Creatinine Clearance: 31.2 mL/min (A) (by C-G formula based on SCr of 1.35 mg/dL (H)). Liver Function Tests: No results for input(s): AST, ALT, ALKPHOS, BILITOT, PROT, ALBUMIN in the last 168 hours. No results for input(s): LIPASE, AMYLASE  in the last 168 hours. No results for input(s): AMMONIA in the last 168 hours. Coagulation Profile: Recent Labs  Lab 06/20/19 1049  INR 0.9   Cardiac Enzymes: No results for input(s): CKTOTAL, CKMB, CKMBINDEX, TROPONINI in the last 168 hours. BNP (last 3 results) No results for input(s): PROBNP in the last 8760 hours. HbA1C: No results for input(s): HGBA1C in the last 72 hours. CBG: No results for input(s): GLUCAP in the last 168 hours. Lipid Profile: No results for input(s): CHOL, HDL, LDLCALC, TRIG, CHOLHDL, LDLDIRECT in the last 72 hours. Thyroid Function Tests: Recent Labs    06/20/19 0833  TSH 14.696*  FREET4 1.04   Anemia Panel: No results for input(s): VITAMINB12, FOLATE, FERRITIN, TIBC, IRON, RETICCTPCT in the last 72 hours. Sepsis Labs: Recent Labs  Lab 06/20/19 0827 06/20/19 1129  PROCALCITON <0.10  --   LATICACIDVEN  --  1.1    Recent Results (from the past 240 hour(s))  Urine culture     Status: None (Preliminary result)   Collection Time: 06/20/19 11:18 AM   Specimen: Urine, Random  Result Value Ref Range Status   Specimen Description   Final    URINE, RANDOM Performed at [facility], [address], [place], [place] [address]    Special Requests   Final    NONE Performed at [facility],  [address], [place], [place] [address]    Culture   Final    NO GROWTH Performed at [facility], [place] [address] [place], [place] [address]    Report Status PENDING  Incomplete  Respiratory Panel by RT PCR (Flu A&B, Covid) - Nasopharyngeal Swab     Status: None   Collection Time: 06/20/19 11:27 AM   Specimen: Nasopharyngeal Swab  Result Value Ref Range Status   SARS Coronavirus 2 by RT PCR NEGATIVE NEGATIVE Final    Comment: (NOTE) SARS-CoV-2 target nucleic acids are NOT DETECTED. The SARS-CoV-2 RNA is generally detectable in upper respiratoy specimens during the acute phase of infection. The lowest concentration of SARS-CoV-2 viral copies this assay can detect is 131 copies/mL. A negative result does not preclude SARS-Cov-2 infection and should not be used as the sole basis for treatment or other patient management decisions. A negative result may occur with  improper specimen collection/handling, submission of specimen other than nasopharyngeal swab, presence of viral mutation(s) within the areas targeted by this assay, and inadequate number of viral copies (<131 copies/mL). A negative result must be combined with clinical observations, patient history, and epidemiological information. The expected result is Negative. Fact Sheet for Patients:  PinkCheek.be Fact Sheet for Healthcare Providers:  GravelBags.it This test is not yet ap proved or cleared by the [place] FDA and  has been authorized for detection and/or diagnosis of SARS-CoV-2 by FDA under an Emergency Use Authorization (EUA). This EUA will remain  in effect (meaning this test can be used) for the duration of the COVID-19 declaration under Section 564(b)(1) of the Act, 21 U.S.C. section 360bbb-3(b)(1), unless the authorization is terminated or revoked sooner.    Influenza A by PCR NEGATIVE NEGATIVE Final   Influenza B by PCR NEGATIVE NEGATIVE  Final    Comment: (NOTE) The Xpert Xpress SARS-CoV-2/FLU/RSV assay is intended as an aid in  the diagnosis of influenza from Nasopharyngeal swab specimens and  should not be used as a sole basis for treatment. Nasal washings and  aspirates are unacceptable for Xpert Xpress SARS-CoV-2/FLU/RSV  testing. Fact Sheet for Patients:  https://www.moore.com/ Fact Sheet for Healthcare Providers: https://www.young.biz/ This test is not yet approved or cleared by the [place] FDA and  has been authorized for detection and/or diagnosis of SARS-CoV-2 by  FDA under an Emergency Use Authorization (EUA). This EUA will remain  in effect (meaning this test can be used) for the duration of the  Covid-19 declaration under Section 564(b)(1) of the Act, 21  U.S.C. section 360bbb-3(b)(1), unless the authorization is  terminated or revoked. Performed at [facility], [address] [place], [place] [address]   Blood culture (routine x 2)     Status: None (Preliminary result)   Collection Time: 06/20/19  4:35 PM   Specimen: BLOOD  Result Value Ref Range Status   Specimen Description BLOOD RT FOREARM  Final   Special Requests   Final    BOTTLES DRAWN AEROBIC AND ANAEROBIC Blood Culture adequate volume   Culture   Final    NO GROWTH 2 DAYS Performed at [facility], [address] [place], [place] [address]    Report Status PENDING  Incomplete  Blood culture (routine x 2)     Status: None (Preliminary result)   Collection Time: 06/20/19  4:35 PM   Specimen: BLOOD  Result Value Ref Range Status   Specimen Description BLOOD LEFT ARM  Final   Special Requests   Final    BOTTLES DRAWN AEROBIC AND ANAEROBIC Blood Culture adequate volume   Culture   Final    NO GROWTH 2 DAYS Performed at [facility], [address] [place], [place] [address]    Report Status PENDING  Incomplete      Imaging Studies   NM PULMONARY VENT AND  PERF (V/Q Scan)  Result Date: 06/20/2019 CLINICAL DATA:  Shortness of breath EXAM: NUCLEAR MEDICINE VENTILATION - PERFUSION LUNG SCAN VIEWS: Anterior, posterior, left lateral, right lateral, RPO, LPO, RAO, LAO-ventilation perfusion RADIOPHARMACEUTICALS:  30.3 mCi of Tc-68m DTPA aerosol inhalation and 4.24 mCi Tc24m MAA IV COMPARISON:  Chest radiograph Jun 20, 2019 FINDINGS: Ventilation: Radiotracer uptake is homogeneous and symmetric. No evident ventilation defects. Perfusion: There is a subsegmental defect in the left apex. No other perfusion defects are evident. Radiotracer otherwise is homogeneous and symmetric bilaterally. IMPRESSION: There is a single subsegmental perfusion defect in the left apex without corresponding chest radiographic or ventilation abnormality. The perfusion study is otherwise unremarkable. This study is felt to constitute a low probability of pulmonary embolus. Electronically Signed   By: Bretta Bang III M.D.   On: 06/20/2019 14:28     Medications   Scheduled Meds: . acetaminophen  1,000 mg Oral BID  . aspirin EC  81 mg Oral Daily  . calcium-vitamin D  1 tablet Oral BID  . feeding supplement (ENSURE ENLIVE)  237 mL Oral BID BM  . ferrous sulfate  325 mg Oral Q breakfast  . lamoTRIgine  50 mg Oral QHS  . levothyroxine  75 mcg Oral Daily  . mouth rinse  15 mL Mouth Rinse BID  . melatonin  5 mg Oral QHS  . multivitamin with minerals  1 tablet Oral Daily  . neomycin-bacitracin-polymyxin  1 application Topical Daily  . pantoprazole  40 mg Oral Daily  . senna-docusate  2 tablet Oral QHS  . simvastatin  40 mg Oral QHS   Continuous Infusions: . sodium chloride 50 mL/hr at 06/22/19 0749       LOS: 1 day    Time spent: 40 minutes total with more than 50%  spent in coordination of care and direct patient contact.    Pennie BanterKelly A Larisa Lanius, DO [facility]  06/22/2019, 12:32 PM    If 7PM-7AM, please contact night-coverage. How to contact the [facility] Attending  or Consulting provider 7A - 7P or covering provider during after hours 7P -7A, for this patient?    1. Check the care team in [facility] and look for a) attending/consulting TR[facility] provider listed and b) the [facility] team listed 2. Log into [email or web address] and use [place]'s universal password to access. If you do not have the password, please contact the hospital operator. 3. Locate the [facility] provider you are looking for under [facility] and page to a number that you can be directly reached. 4. If you still have difficulty reaching the provider, please page the [facility] (Director on Call) for the Hospitalists listed on amion for assistance.

## 2019-06-22 NOTE — Progress Notes (Signed)
End of Shift Summary:  Date: 06/22/19 Shift: 0700-1500 Ambulatory: Up to chair with PT Significant Events: Patient dropped several beats on telemetry today; no pause greater than 2seconds. Dr. Denton Lank notified. No other significant events this shift. Pure[place] in place.

## 2019-06-22 NOTE — Evaluation (Signed)
Physical Therapy Evaluation Patient Details Name: Megan Donaldson MRN: [id number] DOB: [birth year] Today's Date: 06/22/2019   History of Present Illness  Pt is an 86 y/o F with PMH: advanced dementia (possibly vascular), CHF, HTN, HLD, GERD, osteoporosis, hx of DVT/PE, and a bleeding ulcer. Pt presented after a fall at [facility] and then was found to be hypotensive and hypothermic in the emergency department. Upon speaking with pt's daughter, she endorses that pt has had several falls where she just "missed the chair".  Clinical Impression  Pt is a pleasant 86 year old female who was admitted for hypotension. Pt very lethargic and has a tough time increasing alertness level for participation. Needs max encouragement for participation as she states she wants to sleep. Very anxious about mobility. Eventually agreeable to ambulate to recliner, however refuses further mobility efforts. Pt performs bed mobility with cga, transfers with min assist, and ambulation with cga and RW. Pt demonstrates deficits with cognition, balance, mobility, strength. Grossly appears to be at baseline level. Would benefit from skilled PT to address above deficits and promote optimal return to PLOF. Recommend transition to [facility] upon discharge from acute hospitalization. Recommend to transition back to ALF as this will be a familiar environment.     Follow Up Recommendations Home health PT    Equipment Recommendations  None recommended by PT    Recommendations for Other Services       Precautions / Restrictions Precautions Precautions: Fall Restrictions Weight Bearing Restrictions: No      Mobility  Bed Mobility Overal bed mobility: Needs Assistance Bed Mobility: Supine to Sit     Supine to sit: Min guard     General bed mobility comments: takes heavy encouragement for participation. Slight assist for sliding B LEs across bed. Pt keeps trying to return to bed stating she is too tired to participate needing  max coaxing. Once seated at EOB, does improve alertness level  Transfers Overall transfer level: Needs assistance Equipment used: Rolling walker (2 wheeled) Transfers: Sit to/from Stand Sit to Stand: Min assist         General transfer comment: pt becomes anxious to use the RW, prefers her rollator. Needs cues for pushing from seated surface  Ambulation/Gait Ambulation/Gait assistance: Min guard Gait Distance (Feet): 10 Feet Assistive device: Rolling walker (2 wheeled) Gait Pattern/deviations: Step-to pattern     General Gait Details: does fairly well with ambulation, however is very self limited reporting fatigue. Able to ambulate initially to recliner -> BSC -> recliner. Step to gait pattern performed with ability to navigate turns and tight spaces with minimal guidance  Stairs            Wheelchair Mobility    Modified Rankin (Stroke Patients Only)       Balance Overall balance assessment: Needs assistance;History of Falls Sitting-balance support: Bilateral upper extremity supported Sitting balance-Leahy Scale: Good     Standing balance support: Bilateral upper extremity supported Standing balance-Leahy Scale: Fair                               Pertinent Vitals/Pain Pain Assessment: Faces Faces Pain Scale: Hurts a little bit Pain Location: B hips Pain Descriptors / Indicators: Aching Pain Intervention(s): Limited activity within patient's tolerance;Repositioned    Home Living Family/patient expects to be discharged to:: Assisted living               Home Equipment: Walker - 4 wheels  Additional Comments: ambulatory short distances in facility using walker, has PCA 3x/wk to assist in the AM for 5 hours until lunch-helps with feeding, bathing, dressing.    Prior Function Level of Independence: Needs assistance   Gait / Transfers Assistance Needed: pt able to ambulate with 4WW to dining hall at ALF  ADL's / Homemaking Assistance Needed:  pt had some assistance with bathing, dressing, and toileting from staff/has PCA 3x/wk for 5hrs  Comments: per chart review, pt has had falls especially during stand->sit     Hand Dominance        Extremity/Trunk Assessment   Upper Extremity Assessment Upper Extremity Assessment: Generalized weakness(B UE grossly 3+/5)    Lower Extremity Assessment Lower Extremity Assessment: Generalized weakness(B LE grossly 4/5)       Communication   Communication: HOH  Cognition Arousal/Alertness: Lethargic Behavior During Therapy: Anxious Overall Cognitive Status: History of cognitive impairments - at baseline                                 General Comments: Pt thinks she is at [facility]. She takes extended encouragement to participate      General Comments      Exercises Other Exercises Other Exercises: ambulated over to [facility]. Needs cues for transfer on/off. Tends to demonstrate decreased eccentric control. Able to perform self hygiene with min assist.    Assessment/Plan    PT Assessment Patient needs continued PT services  PT Problem List Decreased strength;Decreased activity tolerance;Decreased balance;Decreased mobility;Decreased cognition       PT Treatment Interventions DME instruction;Gait training;Therapeutic activities;Therapeutic exercise;Balance training    PT Goals (Current goals can be found in the Care Plan section)  Acute Rehab PT Goals Patient Stated Goal: to sleep PT Goal Formulation: With patient Time For Goal Achievement: 07/06/19 Potential to Achieve Goals: Good    Frequency Min 2X/week   Barriers to discharge        Co-evaluation               AM-PAC PT "6 Clicks" Mobility  Outcome Measure Help needed turning from your back to your side while in a flat bed without using bedrails?: A Little Help needed moving from lying on your back to sitting on the side of a flat bed without using bedrails?: A Little Help needed moving to  and from a bed to a chair (including a wheelchair)?: A Little Help needed standing up from a chair using your arms (e.g., wheelchair or bedside chair)?: A Little Help needed to walk in hospital room?: A Little Help needed climbing 3-5 steps with a railing? : A Lot 6 Click Score: 17    End of Session   Activity Tolerance: Patient tolerated treatment well Patient left: in chair;with chair alarm set Nurse Communication: Mobility status PT Visit Diagnosis: Muscle weakness (generalized) (M62.81);History of falling (Z91.81);Difficulty in walking, not elsewhere classified (R26.2)    Time: 3785-8850 PT Time Calculation (min) (ACUTE ONLY): 32 min   Charges:   PT Evaluation $PT Eval Moderate Complexity: 1 Mod PT Treatments $Therapeutic Activity: 8-22 mins        Elizabeth Palau, PT, DPT [phone]   Novak Stgermaine 06/22/2019, 12:02 PM

## 2019-06-23 DIAGNOSIS — F0280 Dementia in other diseases classified elsewhere without behavioral disturbance: Secondary | ICD-10-CM

## 2019-06-23 DIAGNOSIS — G309 Alzheimer's disease, unspecified: Secondary | ICD-10-CM

## 2019-06-23 LAB — CBC
HCT: 23.2 % — ABNORMAL LOW (ref 36.0–46.0)
Hemoglobin: 7.6 g/dL — ABNORMAL LOW (ref 12.0–15.0)
MCH: 30.4 pg (ref 26.0–34.0)
MCHC: 32.8 g/dL (ref 30.0–36.0)
MCV: 92.8 fL (ref 80.0–100.0)
Platelets: 184 10*3/uL (ref 150–400)
RBC: 2.5 MIL/uL — ABNORMAL LOW (ref 3.87–5.11)
RDW: 16.5 % — ABNORMAL HIGH (ref 11.5–15.5)
WBC: 6.8 10*3/uL (ref 4.0–10.5)
nRBC: 0 % (ref 0.0–0.2)

## 2019-06-23 LAB — IRON AND TIBC
Iron: 47 ug/dL (ref 28–170)
Saturation Ratios: 18 % (ref 10.4–31.8)
TIBC: 269 ug/dL (ref 250–450)
UIBC: 222 ug/dL

## 2019-06-23 LAB — BASIC METABOLIC PANEL
Anion gap: 7 (ref 5–15)
BUN: 19 mg/dL (ref 8–23)
CO2: 24 mmol/L (ref 22–32)
Calcium: 9 mg/dL (ref 8.9–10.3)
Chloride: 110 mmol/L (ref 98–111)
Creatinine, Ser: 1.16 mg/dL — ABNORMAL HIGH (ref 0.44–1.00)
GFR calc Af Amer: 48 mL/min — ABNORMAL LOW (ref >60)
GFR calc non Af Amer: 42 mL/min — ABNORMAL LOW (ref >60)
Glucose, Bld: 88 mg/dL (ref 70–99)
Potassium: 4 mmol/L (ref 3.5–5.1)
Sodium: 141 mmol/L (ref 135–145)

## 2019-06-23 LAB — HEMOGLOBIN AND HEMATOCRIT, BLOOD
HCT: 20.7 % — ABNORMAL LOW (ref 36.0–46.0)
HCT: 23.6 % — ABNORMAL LOW (ref 36.0–46.0)
Hemoglobin: 7 g/dL — ABNORMAL LOW (ref 12.0–15.0)
Hemoglobin: 7.6 g/dL — ABNORMAL LOW (ref 12.0–15.0)

## 2019-06-23 LAB — RETICULOCYTES
Immature Retic Fract: 32.7 % — ABNORMAL HIGH (ref 2.3–15.9)
RBC.: 2.48 MIL/uL — ABNORMAL LOW (ref 3.87–5.11)
Retic Count, Absolute: 59.8 10*3/uL (ref 19.0–186.0)
Retic Ct Pct: 2.4 % (ref 0.4–3.1)

## 2019-06-23 LAB — URINE CULTURE: Culture: NO GROWTH

## 2019-06-23 LAB — VITAMIN B12: Vitamin B-12: 488 pg/mL (ref 180–914)

## 2019-06-23 LAB — FERRITIN: Ferritin: 101 ng/mL (ref 11–307)

## 2019-06-23 LAB — FOLATE: Folate: 30 ng/mL (ref >5.9)

## 2019-06-23 LAB — MAGNESIUM: Magnesium: 2.2 mg/dL (ref 1.7–2.4)

## 2019-06-23 MED ORDER — BISACODYL 5 MG PO TBEC: 5.0000 mg | DELAYED_RELEASE_TABLET | Freq: Every day | ORAL | Status: DC | PRN

## 2019-06-23 NOTE — Care Management Important Message (Signed)
Important Message  Patient Details  Name: Megan Donaldson MRN: [id number] Date of Birth: 1927-05-03   Medicare Important Message Given:  N/A - LOS <3 / Initial given by admissions     Megan Donaldson 06/23/2019, 9:59 AM

## 2019-06-23 NOTE — Progress Notes (Signed)
PROGRESS NOTE    Megan Donaldson   ZOX:[id number]  DOB: [birth year]  PCP: Orvis Brill, [facility]    DOA: 06/20/2019 LOS: 2   Brief Narrative   Megan Donaldson is a 86 y.o. female with medical history of dementia, hypertension, hyperlipidemia, history of PE but is not appear to be on any blood thinners, who presented to the ED on 06/20/19 from [facility] after a a fall.  In the ED, she was found to be hypotensive and hypothermic (per family is chronic intermittent issue).  Initial BP was 92/50, temperature 34.7 C, started on [facility].  Lactate normal at 1.1, Hbg 9.7, platelets 143k.  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 UA negative for infection.  Forearm x-rays negative for fracture.  CT of the head negative for acute abnormality.  No acute abnormality of the cervical spine.  Thoracolumbar spine no acute fracture.  She was treated empirically with broad antibiotics in the ED and admitted for further evaluation and management.     Assessment & Plan   Principal Problem:   Hypotension Active Problems:   Generalized weakness   Essential hypertension   History of pulmonary embolism   Hypothermia   Gastroesophageal reflux disease   Hyperlipidemia   Hypothyroidism   Dementia without behavioral disturbance (HCC)   Hypotension - unclear etiology.  Sepsis ruled out.  No infection source on chest xray, UA, blood cultures negative to date, patient afebrile and without leukocytosis.  Her hemoglobin did drop from 10.0 on admission to 7.6, now down to 7.0, and she has history of GI bleeding.  Given history of PE, V/Q scan was obtained and low probability of acute PE.  Initially treated with empiric antibiotics, these were stopped and patient remains without signs of infection. --continue holding Lasix   --further mgmt as below --Maintain MAP >= 65  Acute on Chronic Normocytic Anemia - baseline Hbg appears to be around 9.5-10.    Takes iron supplement outpatient, continued.  Iron studies and B12 normal.  Presented with Hbg 10.0, it's steadily decreased, now 7.0.  Concern for occult GI bleeding. --repeat H&H later this afternoon --f/u folate level, pending --transfuse if Hbg < 7.0 --FOBT pending --SCD's for VTE ppx  Generalized weakness - likely multifactorial with hypotension primary factor.  PT/OT evaluations.  SNF recommended.  Plan to [facility] to [facility] on discharge.    Acute Kidney Injury vs ?CKD - present on admission.  Baseline renal function unknown, but suspect this is pre-renal AKI in setting of hypotension.  Renal function improving.   --renally dose meds as indicated --avoid nephrotoxins and hypotension --monitor BMP daily  Essential hypertension - now with hypotension.  Only appears to be on Lasix outpatient. Hold Lasix.  Monitor BP.    History of pulmonary embolism - low probability V/Q scan.  No hypoxia or tachycardia.  Not on anticoagulation due to history of GI bleeding.   Hypothermia - chronic, intermittent.  Okay to use [facility] as needed.  Gastroesophageal reflux disease - continue Protonix  Hyperlipidemia - continue Zocor  Hypothyroidism - continue Synthroid  History of seizures - continue Lamictal  Dementia without behavioral disturbance - stable, monitor.  Not on medication.   Obesity: Body mass index is 32.8 kg/m.  Complicates overall care and prognosis.  DVT prophylaxis: SCD's  Diet:  Diet Orders (From admission, onward)    Start     Ordered   06/21/19 0345  Diet 2  gram sodium Room service appropriate? Yes; Fluid consistency: Thin  Diet effective now    Question Answer Comment  Room service appropriate? Yes   Fluid consistency: Thin      06/21/19 0344            Code Status: DNR    Subjective 06/23/19    Patient seen at bedside.  No acute events reported.  She is more alert and talkative this morning compared to yesterday.  Color looks good as well.  Some  mild confusion noted.  She denies pain, fever/chills, N/V.  Says she is hot, asks for [employer] removed.  Disposition Plan & Communication   Status is: Inpatient  Remains inpatient appropriate because:Patient's hemoglobin dropped and now undergoing evaluation of acute blood loss.   Dispo: The patient is from: SNF              Anticipated d/c is to: SNF              Anticipated d/c date is: 1-2 days              Patient currently is not medically stable to d/c.    Family Communication: none at bedside, will attempt to call with update   Consults, Procedures, Significant Events   Consultants:   none  Procedures:   none  Antimicrobials:   Empiric on admission: Vanc, Cefepime, Flagyl    Objective   Vitals:   06/23/19 0435 06/23/19 0436 06/23/19 0600 06/23/19 0859  BP:   (!) 150/64   Pulse:      Resp:  19 18   Temp: 97.6 F (36.4 C)   98 F (36.7 C)  TempSrc: Rectal   Rectal  SpO2:      Weight:      Height:        Intake/Output Summary (Last 24 hours) at 06/23/2019 1202 Last data filed at 06/23/2019 7262 Gross per 24 hour  Intake 719.11 ml  Output 650 ml  Net 69.11 ml   Filed Weights   06/20/19 0641 06/21/19 0115  Weight: 89.4 kg 89.4 kg    Physical Exam:  General exam: awake, alert, no acute distress Respiratory system: CTAB, no wheezes, rales or rhonchi, normal respiratory effort. Cardiovascular system: normal S1/S2, RRR, no pedal edema.   Gastrointestinal system: soft, NT, ND, no HSM felt, +bowel sounds. Central nervous system: no gross focal neurologic deficits, normal speech Extremities: no edema, normal tone Psychiatry: normal mood, congruent affect  Labs   Data Reviewed: I have personally reviewed following labs and imaging studies  CBC: Recent Labs  Lab 06/20/19 0827 06/20/19 1049 06/21/19 0445 06/22/19 0456 06/22/19 1401 06/23/19 0434 06/23/19 1142  WBC 8.8  --  6.4 6.4  --  6.8  --   NEUTROABS 6.6  --   --   --   --   --   --    HGB 10.0*   < > 7.9* 7.6* 8.0* 7.6* 7.0*  HCT 30.9*   < > 24.4* 23.1* 24.9* 23.2* 20.7*  MCV 93.4  --  93.8 92.4  --  92.8  --   PLT 143*  --  151 161  --  184  --    < > = values in this interval not displayed.   Basic Metabolic Panel: Recent Labs  Lab 06/20/19 0827 06/21/19 0445 06/22/19 0456 06/23/19 0434  NA 139 139 140 141  K 3.9 4.1 4.0 4.0  CL 104 107 109 110  CO2 27 24  24 24  GLUCOSE 97 74 85 88  BUN 23 22 20 19   CREATININE 1.63* 1.47* 1.35* 1.16*  CALCIUM 9.3 8.8* 8.8* 9.0  MG  --   --   --  2.2   GFR: Estimated Creatinine Clearance: 36.3 mL/min (A) (by C-G formula based on SCr of 1.16 mg/dL (H)). Liver Function Tests: No results for input(s): AST, ALT, ALKPHOS, BILITOT, PROT, ALBUMIN in the last 168 hours. No results for input(s): LIPASE, AMYLASE in the last 168 hours. No results for input(s): AMMONIA in the last 168 hours. Coagulation Profile: Recent Labs  Lab 06/20/19 1049  INR 0.9   Cardiac Enzymes: No results for input(s): CKTOTAL, CKMB, CKMBINDEX, TROPONINI in the last 168 hours. BNP (last 3 results) No results for input(s): PROBNP in the last 8760 hours. HbA1C: No results for input(s): HGBA1C in the last 72 hours. CBG: No results for input(s): GLUCAP in the last 168 hours. Lipid Profile: No results for input(s): CHOL, HDL, LDLCALC, TRIG, CHOLHDL, LDLDIRECT in the last 72 hours. Thyroid Function Tests: No results for input(s): TSH, T4TOTAL, FREET4, T3FREE, THYROIDAB in the last 72 hours. Anemia Panel: Recent Labs    06/23/19 0434  VITAMINB12 488  FOLATE 30.0  FERRITIN 101  TIBC 269  IRON 47  RETICCTPCT 2.4   Sepsis Labs: Recent Labs  Lab 06/20/19 0827 06/20/19 1129  PROCALCITON <0.10  --   LATICACIDVEN  --  1.1    Recent Results (from the past 240 hour(s))  Urine culture     Status: None (Preliminary result)   Collection Time: 06/20/19 11:18 AM   Specimen: Urine, Random  Result Value Ref Range Status   Specimen Description    Final    URINE, RANDOM Performed at [facility], [address], [place], [place]    Special Requests   Final    NONE Performed at [facility], [address], [place], [place]    Culture   Final    NO GROWTH Performed at [facility], [address], [place], [place]    Report Status PENDING  Incomplete  Respiratory Panel by RT PCR (Flu A&B, Covid) - Nasopharyngeal Swab     Status: None   Collection Time: 06/20/19 11:27 AM   Specimen: Nasopharyngeal Swab  Result Value Ref Range Status   SARS Coronavirus 2 by RT PCR NEGATIVE NEGATIVE Final    Comment: (NOTE) SARS-CoV-2 target nucleic acids are NOT DETECTED. The SARS-CoV-2 RNA is generally detectable in upper respiratoy specimens during the acute phase of infection. The lowest concentration of SARS-CoV-2 viral copies this assay can detect is 131 copies/mL. A negative result does not preclude SARS-Cov-2 infection and should not be used as the sole basis for treatment or other patient management decisions. A negative result may occur with  improper specimen collection/handling, submission of specimen other than nasopharyngeal swab, presence of viral mutation(s) within the areas targeted by this assay, and inadequate number of viral copies (<131 copies/mL). A negative result must be combined with clinical observations, patient history, and epidemiological information. The expected result is Negative. Fact Sheet for Patients:  08/20/19 Fact Sheet for Healthcare Providers:  https://www.moore.com/ This test is not yet ap proved or cleared by the https://www.young.biz/ FDA and  has been authorized for detection and/or diagnosis of SARS-CoV-2 by FDA under an Emergency Use Authorization (EUA). This EUA will remain  in effect (meaning this test can be used) for the duration of the COVID-19 declaration  under Section 564(b)(1) of the Act,  21 U.S.C. section 360bbb-3(b)(1), unless the authorization is terminated or revoked sooner.    Influenza A by PCR NEGATIVE NEGATIVE Final   Influenza B by PCR NEGATIVE NEGATIVE Final    Comment: (NOTE) The Xpert Xpress SARS-CoV-2/FLU/RSV assay is intended as an aid in  the diagnosis of influenza from Nasopharyngeal swab specimens and  should not be used as a sole basis for treatment. Nasal washings and  aspirates are unacceptable for Xpert Xpress SARS-CoV-2/FLU/RSV  testing. Fact Sheet for Patients: https://www.moore.com/ Fact Sheet for Healthcare Providers: https://www.young.biz/ This test is not yet approved or cleared by the [place] FDA and  has been authorized for detection and/or diagnosis of SARS-CoV-2 by  FDA under an Emergency Use Authorization (EUA). This EUA will remain  in effect (meaning this test can be used) for the duration of the  Covid-19 declaration under Section 564(b)(1) of the Act, 21  U.S.C. section 360bbb-3(b)(1), unless the authorization is  terminated or revoked. Performed at [facility], [address] [place], [place] [address]   Blood culture (routine x 2)     Status: None (Preliminary result)   Collection Time: 06/20/19  4:35 PM   Specimen: BLOOD  Result Value Ref Range Status   Specimen Description BLOOD RT FOREARM  Final   Special Requests   Final    BOTTLES DRAWN AEROBIC AND ANAEROBIC Blood Culture adequate volume   Culture   Final    NO GROWTH 3 DAYS Performed at [facility], [address], [place], [place] [address]    Report Status PENDING  Incomplete  Blood culture (routine x 2)     Status: None (Preliminary result)   Collection Time: 06/20/19  4:35 PM   Specimen: BLOOD  Result Value Ref Range Status   Specimen Description BLOOD LEFT ARM  Final   Special Requests   Final    BOTTLES DRAWN AEROBIC AND ANAEROBIC Blood Culture adequate volume   Culture   Final    NO GROWTH  3 DAYS Performed at [facility], [address] [place], [place] [address]    Report Status PENDING  Incomplete      Imaging Studies   No results found.   Medications   Scheduled Meds: . acetaminophen  1,000 mg Oral BID  . aspirin EC  81 mg Oral Daily  . calcium-vitamin D  1 tablet Oral BID  . feeding supplement (ENSURE ENLIVE)  237 mL Oral BID BM  . ferrous sulfate  325 mg Oral Q breakfast  . lamoTRIgine  50 mg Oral QHS  . levothyroxine  75 mcg Oral Daily  . mouth rinse  15 mL Mouth Rinse BID  . melatonin  5 mg Oral QHS  . multivitamin with minerals  1 tablet Oral Daily  . neomycin-bacitracin-polymyxin  1 application Topical Daily  . pantoprazole  40 mg Oral Daily  . senna-docusate  2 tablet Oral QHS  . simvastatin  40 mg Oral QHS   Continuous Infusions:      LOS: 2 days    Time spent: 25 minutes   Pennie Banter, DO [facility]  06/23/2019, 12:02 PM    If 7PM-7AM, please contact night-coverage. How to contact the [facility] Attending or Consulting provider 7A - 7P or covering provider during after hours 7P -7A, for this patient?    1. Check the care team in [facility] and look for a) attending/consulting TR[facility] provider listed and b) the [facility] team listed  2. Log into [email or web address] and use [place]'s universal password to access. If you do not have the password, please contact the hospital operator. 3. Locate the [facility] provider you are looking for under [facility] and page to a number that you can be directly reached. 4. If you still have difficulty reaching the provider, please page the [facility] (Director on Call) for the Hospitalists listed on amion for assistance.

## 2019-06-24 DIAGNOSIS — T68XXXD Hypothermia, subsequent encounter: Secondary | ICD-10-CM

## 2019-06-24 LAB — BASIC METABOLIC PANEL
Anion gap: 6 (ref 5–15)
BUN: 18 mg/dL (ref 8–23)
CO2: 25 mmol/L (ref 22–32)
Calcium: 9.1 mg/dL (ref 8.9–10.3)
Chloride: 109 mmol/L (ref 98–111)
Creatinine, Ser: 1.32 mg/dL — ABNORMAL HIGH (ref 0.44–1.00)
GFR calc Af Amer: 41 mL/min — ABNORMAL LOW (ref >60)
GFR calc non Af Amer: 36 mL/min — ABNORMAL LOW (ref >60)
Glucose, Bld: 77 mg/dL (ref 70–99)
Potassium: 3.8 mmol/L (ref 3.5–5.1)
Sodium: 140 mmol/L (ref 135–145)

## 2019-06-24 LAB — CBC
HCT: 23.4 % — ABNORMAL LOW (ref 36.0–46.0)
Hemoglobin: 7.9 g/dL — ABNORMAL LOW (ref 12.0–15.0)
MCH: 31.1 pg (ref 26.0–34.0)
MCHC: 33.8 g/dL (ref 30.0–36.0)
MCV: 92.1 fL (ref 80.0–100.0)
Platelets: 198 10*3/uL (ref 150–400)
RBC: 2.54 MIL/uL — ABNORMAL LOW (ref 3.87–5.11)
RDW: 16.8 % — ABNORMAL HIGH (ref 11.5–15.5)
WBC: 5.7 10*3/uL (ref 4.0–10.5)
nRBC: 0 % (ref 0.0–0.2)

## 2019-06-24 MED ORDER — FUROSEMIDE 20 MG PO TABS: 20.0000 mg | ORAL_TABLET | Freq: Every day | ORAL | 0 refills | Status: AC | PRN

## 2019-06-24 NOTE — Discharge Summary (Signed)
Physician Discharge Summary  Megan VERBEKE ZOX:[id number] DOB: 1927-06-10 DOA: 06/20/2019  PCP: Almetta [facility]  Admit date: 06/20/2019 Discharge date: 06/24/2019  Admitted From: [facility] Disposition:  [place]  Recommendations for Outpatient Follow-up:  1. Follow up with PCP in 1-2 weeks 2. Please obtain BMP/CBC in one week 3. Please follow up on patient's hemoglobin.  She had a drop in Hbg from 9.7 down into the 7's during admission.  It since remained stable and no signs of blood loss.  Please recheck within 1 week or less.  Home Health: Yes, PT and OT Equipment/Devices: none   Discharge Condition: Stable  CODE STATUS: DNR  Diet recommendation: low sodium (2g/day)   Discharge Diagnoses: Principal Problem:   Hypotension Active Problems:   Generalized weakness   Essential hypertension   History of pulmonary embolism   Hypothermia   Gastroesophageal reflux disease   Hyperlipidemia   Hypothyroidism   Dementia without behavioral disturbance (HCC)    Summary of HPI and Hospital Course:  Megan Donaldson is a 86 y.o. female with medical history of dementia, hypertension, hyperlipidemia, history of PE but is not appear to be on any blood thinners, who presented to the ED on 06/20/19 from [facility] after a a fall.  In the ED, she was found to be hypotensive and hypothermic (per family is chronic intermittent issue).  Initial BP was 92/50, temperature 34.7 C, started on [employer]  Lactate normal at 1.1, Hbg 9.7, platelets 143k.  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 UA negative for infection.  Forearm x-rays negative for fracture.  CT of the head negative for acute abnormality.  No acute abnormality of the cervical spine.  Thoracolumbar spine no acute fracture.  She was treated empirically with broad antibiotics in the ED and admitted for further evaluation and management.      Hypotension - unclear  etiology.  Sepsis ruled out.  No infection source on chest xray, UA, blood cultures negative to date, patient afebrile and without leukocytosis.  Her hemoglobin did drop from 10.0 on admission to 7.6, now down to 7.0, and she has history of GI bleeding.  Given history of PE, V/Q scan was obtained and low probability of acute PE.  Initially treated with empiric antibiotics, these were stopped and patient remains without signs of infection. --Lasix held during admission.  Resumed AS NEEDED for edema on discharge. --further mgmt as below --Maintain MAP >= 65  Acute on Chronic Normocytic Anemia - baseline Hbg appears to be around 9.5-10.   Takes iron supplement outpatient, continued.  Iron studies and B12 normal.  Presented with Hbg 10.0, it decreased to nadir of 7.0 then improved, 7.9 at time of discharge.  Concern for occult GI bleeding, FOBT was not able to be collected.  Recheck a CBC within 1 week or sooner if any signs of bleeding.  Generalized weakness - likely multifactorial with hypotension primary factor.  PT/OT evaluations.  SNF recommended by OT, home health recommended by PT.  Will return to [facility] on discharge with home health therapy.    Acute Kidney Injury vs ?CKD - present on admission.  Baseline renal function unknown, but suspect this is pre-renal AKI in setting of hypotension.  Renal function improving.   --renally dose meds as indicated --avoid nephrotoxins and hypotension --recheck BMP   Essential hypertension - presented with hypotension.  Only appears to be on Lasix outpatient.  Hold Lasix, use  only as needed for edema.  Monitor BP.    History of pulmonary embolism - low probability V/Q scan.  No hypoxia or tachycardia.  Not on anticoagulation due to history of GI bleeding.   Hypothermia - chronic, intermittent.  Okay to use [facility] as needed.  Gastroesophageal reflux disease - continue Protonix  Hyperlipidemia - continue Zocor  Hypothyroidism - continue  Synthroid  History of seizures - continue Lamictal  Dementia without behavioral disturbance - stable, monitor.  Not on medication.   Obesity: Body mass index is 32.8 kg/m.  Complicates overall care and prognosis.   Discharge Instructions   Discharge Instructions    Call MD for:  extreme fatigue   Complete by: As directed    Call MD for:  persistant dizziness or light-headedness   Complete by: As directed    Call MD for:  temperature >100.4   Complete by: As directed    Diet - low sodium heart healthy   Complete by: As directed    Increase activity slowly   Complete by: As directed      Allergies as of 06/24/2019      Reactions   Amoxicillin    Has patient had a PCN reaction causing immediate rash, facial/tongue/throat swelling, SOB or lightheadedness with hypotension: Unknown Has patient had a PCN reaction causing severe rash involving mucus membranes or skin necrosis: Unknown Has patient had a PCN reaction that required hospitalization: Unknown Has patient had a PCN reaction occurring within the last 10 years: Unknown If all of the above answers are "NO", then may proceed with Cephalosporin use.   Latex    Lisinopril    Mometasone Furoate       Medication List    TAKE these medications   acetaminophen 500 MG tablet Commonly known as: TYLENOL Take 1,000 mg by mouth 2 (two) times daily.   aspirin EC 81 MG tablet Take 81 mg by mouth daily.   Calcium 600-D 600-400 MG-UNIT Tabs Generic drug: Calcium Carbonate-Vitamin D3 Take 1 tablet by mouth 2 (two) times daily.   escitalopram 10 MG tablet Commonly known as: LEXAPRO Take 10 mg by mouth daily.   feeding supplement (ENSURE ENLIVE) Liqd Take 237 mLs by mouth 2 (two) times daily between meals.   Ferrous Sulfate 142 (45 Fe) MG Tbcr Take 45 mg by mouth daily.   furosemide 20 MG tablet Commonly known as: LASIX Take 1 tablet (20 mg total) by mouth daily as needed for edema. What changed:   when to take  this  reasons to take this   lamoTRIgine 25 MG tablet Commonly known as: LAMICTAL Take 37.5 mg by mouth daily.   lamoTRIgine 25 MG tablet Commonly known as: LAMICTAL Take 50 mg by mouth at bedtime.   levothyroxine 75 MCG tablet Commonly known as: SYNTHROID Take 75 mcg by mouth daily.   melatonin 3 MG Tabs tablet Take 3 mg by mouth at bedtime.   multivitamin with minerals Tabs tablet Take 1 tablet by mouth daily.   neomycin-bacitracin-polymyxin Oint Commonly known as: NEOSPORIN Apply 1 application topically daily. (apply to left leg)   nystatin cream Commonly known as: MYCOSTATIN Apply 1 application topically 2 (two) times daily as needed for dry skin. (apply to skin folds)   pantoprazole 40 MG tablet Commonly known as: PROTONIX Take 40 mg by mouth daily.   PEG 3350 17 g Pack Take 17 g by mouth daily as needed (moderate constipation).   senna-docusate 8.6-50 MG tablet Commonly known as: Senokot-S  Take 2 tablets by mouth at bedtime.   simvastatin 40 MG tablet Commonly known as: ZOCOR Take 40 mg by mouth at bedtime.   traZODone 50 MG tablet Commonly known as: DESYREL Take 2 tablets (100 mg total) by mouth at bedtime as needed for sleep.   white petrolatum Gel Commonly known as: VASELINE Apply 1 application topically 3 (three) times daily as needed for lip care.      Follow-up Information    [facility]   Specialty: Geriatric Medicine Why: As needed Contact information: [address] [place] [address] [phone]          Allergies  Allergen Reactions  . Amoxicillin     Has patient had a PCN reaction causing immediate rash, facial/tongue/throat swelling, SOB or lightheadedness with hypotension: Unknown Has patient had a PCN reaction causing severe rash involving mucus membranes or skin necrosis: Unknown Has patient had a PCN reaction that required hospitalization: Unknown Has patient had a PCN reaction occurring  within the last 10 years: Unknown If all of the above answers are "NO", then may proceed with Cephalosporin use.   . Latex   . Lisinopril   . Mometasone Furoate     Consultations:  none    Procedures/Studies: DG Elbow Complete Left  Result Date: 06/20/2019 CLINICAL DATA:  Pain following fall EXAM: LEFT ELBOW - COMPLETE 3+ VIEW COMPARISON:  None. FINDINGS: Frontal, lateral, and bilateral oblique views were obtained. No evident fracture or dislocation. No joint effusion. There is narrowing of the elbow joint without erosive change. Focus of calcification is noted adjacent to the lateral distal humeral condyle. IMPRESSION: Osteoarthritic change. Probable calcific tendinosis laterally. No fracture, dislocation, or joint effusion. Electronically Signed   By: Bretta Bang III M.D.   On: 06/20/2019 09:08   DG Forearm Right  Result Date: 06/20/2019 CLINICAL DATA:  Right wrist and forearm pain secondary to a fall this morning. EXAM: RIGHT FOREARM - 2 VIEW COMPARISON:  Radiographs dated 11/10/2018 FINDINGS: There is no fracture or dislocation. Arthritic changes at the elbow possible calcified loose body in the joint. Osteophyte on the radial head. IMPRESSION: No acute abnormality. Electronically Signed   By: Francene Boyers M.D.   On: 06/20/2019 08:10   DG Wrist Complete Right  Result Date: 06/20/2019 CLINICAL DATA:  Acute right wrist pain after fall today. EXAM: RIGHT WRIST - COMPLETE 3+ VIEW COMPARISON:  None. FINDINGS: There is no evidence of fracture or dislocation. Severe degenerative changes seen involving the first carpometacarpal joint. Soft tissues are unremarkable. IMPRESSION: Severe degenerative joint disease of the first carpometacarpal joint. No acute abnormality seen in the right wrist. Electronically Signed   By: Lupita Raider M.D.   On: 06/20/2019 08:07   CT Head Wo Contrast  Result Date: 06/20/2019 CLINICAL DATA:  Headache after head trauma secondary to a fall this morning. EXAM:  CT HEAD WITHOUT CONTRAST TECHNIQUE: Contiguous axial images were obtained from the base of the skull through the vertex without intravenous contrast. COMPARISON:  CT scan of the head dated 09/17/2017 FINDINGS: Brain: No evidence of acute infarction, hemorrhage, hydrocephalus, extra-axial collection or mass lesion/mass effect. Mild atrophy. Extensive periventricular white matter lucency consistent with chronic small vessel ischemic disease, stable. Vascular: No hyperdense vessel or unexpected calcification. Skull: Normal. Negative for fracture or focal lesion. Sinuses/Orbits: Normal. Other: None IMPRESSION: No acute abnormality. Stable atrophy with extensive chronic small vessel ischemic disease. Electronically Signed   By: Francene Boyers M.D.   On:  06/20/2019 08:12   CT Cervical Spine Wo Contrast  Result Date: 06/20/2019 CLINICAL DATA:  Trauma secondary to a fall this morning. EXAM: CT CERVICAL SPINE WITHOUT CONTRAST TECHNIQUE: Multidetector CT imaging of the cervical spine was performed without intravenous contrast. Multiplanar CT image reconstructions were also generated. COMPARISON:  CT scan dated 02/11/2010 FINDINGS: Alignment: 2 mm spondylolisthesis of C7 on T1. Skull base and vertebrae: No fracture. The C4-5 level is ankylosed. 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 This has enlarged appreciably since the prior CT scan. No visible canal hematoma. No prevertebral soft tissue swelling. Disc levels: There is diffuse disc space narrowing throughout the cervical spine without visible disc protrusion. No significant foraminal stenoses. Moderate right facet arthritis at C2-3. Ankylosis of the right facet joint and vertebral bodies at C4-5. Moderate bilateral facet arthritis at C7-T1. The facet joints at C7-T1 may be  ankylosed. Upper chest: Negative. Other: None IMPRESSION: 1. No acute abnormality of the cervical spine. 2. 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 The Electronically Signed   By: Francene Boyers M.D.   On: 06/20/2019 08:19   CT Thoracic Spine Wo Contrast  Result Date: 06/20/2019 CLINICAL DATA:  Fall from standing EXAM: CT THORACIC AND LUMBAR SPINE WITHOUT CONTRAST TECHNIQUE: Multidetector CT imaging of the thoracic and lumbar spine was performed without contrast. Multiplanar CT image reconstructions were also generated. COMPARISON:  None. FINDINGS: CT THORACIC SPINE FINDINGS Alignment: Anteroposterior alignment is maintained. Vertebrae: There is no acute fracture or compression deformity identified. No suspicious osseous lesion. Paraspinal and other soft tissues: Hiatal hernia. Bibasilar atelectasis. Aortic atherosclerosis. Disc levels: Multilevel degenerative changes with disc space narrowing, endplate osteophytes and irregularity, vacuum disc phenomenon, and facet hypertrophy. There is no high-grade osseous encroachment on the spinal canal. CT LUMBAR SPINE FINDINGS Segmentation: 5 lumbar type vertebrae. Alignment: Levoscoliosis.  Trace retrolisthesis at L2-L3. Vertebrae: No acute fracture or compression deformity. No suspicious osseous lesion. Paraspinal and other soft tissues: Subcentimeter nonobstructing right upper pole renal collecting system calculus. Aortic atherosclerosis. Disc levels: Multilevel degenerative changes are present with disc space narrowing, endplate osteophytes and irregularity, and facet hypertrophy. There is no high-grade osseous encroachment on the spinal canal. IMPRESSION: No acute fracture of the thoracolumbar spine. Electronically Signed   By: Guadlupe Spanish M.D.   On: 06/20/2019 08:15   CT Lumbar Spine Wo  Contrast  Result Date: 06/20/2019 CLINICAL DATA:  Fall from standing EXAM: CT THORACIC AND LUMBAR SPINE WITHOUT CONTRAST TECHNIQUE: Multidetector CT imaging of the thoracic and lumbar spine was performed without contrast. Multiplanar CT image reconstructions were also generated. COMPARISON:  None. FINDINGS: CT THORACIC SPINE FINDINGS Alignment: Anteroposterior alignment is maintained. Vertebrae: There is no acute fracture or compression deformity identified. No suspicious osseous lesion. Paraspinal and other soft tissues: Hiatal hernia. Bibasilar atelectasis. Aortic atherosclerosis. Disc levels: Multilevel degenerative changes with disc space narrowing, endplate osteophytes and irregularity, vacuum disc phenomenon, and facet hypertrophy. There is no high-grade osseous encroachment on the spinal canal. CT LUMBAR SPINE FINDINGS Segmentation: 5 lumbar type vertebrae. Alignment: Levoscoliosis.  Trace retrolisthesis at L2-L3. Vertebrae: No acute fracture or compression deformity. No suspicious osseous lesion. Paraspinal and other soft tissues: Subcentimeter nonobstructing  right upper pole renal collecting system calculus. Aortic atherosclerosis. Disc levels: Multilevel degenerative changes are present with disc space narrowing, endplate osteophytes and irregularity, and facet hypertrophy. There is no high-grade osseous encroachment on the spinal canal. IMPRESSION: No acute fracture of the thoracolumbar spine. Electronically Signed   By: Guadlupe Spanish M.D.   On: 06/20/2019 08:15   NM PULMONARY VENT AND PERF (V/Q Scan)  Result Date: 06/20/2019 CLINICAL DATA:  Shortness of breath EXAM: NUCLEAR MEDICINE VENTILATION - PERFUSION LUNG SCAN VIEWS: Anterior, posterior, left lateral, right lateral, RPO, LPO, RAO, LAO-ventilation perfusion RADIOPHARMACEUTICALS:  30.3 mCi of Tc-63m DTPA aerosol inhalation and 4.24 mCi Tc83m MAA IV COMPARISON:  Chest radiograph Jun 20, 2019 FINDINGS: Ventilation: Radiotracer uptake is homogeneous  and symmetric. No evident ventilation defects. Perfusion: There is a subsegmental defect in the left apex. No other perfusion defects are evident. Radiotracer otherwise is homogeneous and symmetric bilaterally. IMPRESSION: There is a single subsegmental perfusion defect in the left apex without corresponding chest radiographic or ventilation abnormality. The perfusion study is otherwise unremarkable. This study is felt to constitute a low probability of pulmonary embolus. Electronically Signed   By: Bretta Bang III M.D.   On: 06/20/2019 14:28   DG Chest Portable 1 View  Result Date: 06/20/2019 CLINICAL DATA:  Shortness of breath. EXAM: PORTABLE CHEST 1 VIEW COMPARISON:  Chest x-ray 01/26/2019 and chest CT dated 04/21/2010 FINDINGS: The heart size and pulmonary vascularity are normal. No infiltrates or effusions. Minimal linear atelectasis in the right midzone secondary to a shallow inspiration. No appreciable effusions. No acute bone abnormality. 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 IMPRESSION: No significant abnormality of the chest. 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 Electronically Signed   By: Francene Boyers M.D.   On: 06/20/2019 08:08     Subjective: patient seen at bedside.  She reports feeling well.  Denies pain, fever/chills, abdominal pain, nausea or other complaints.  She is happy to hear returning to [facility] today.   Discharge Exam: Vitals:   06/24/19 0024 06/24/19 0911  BP: (!) 116/56 113/61  Pulse: 69 79  Resp: 16 20  Temp: (!) 97.5 F (36.4 C) 98.7 F (37.1 C)  SpO2: 96% 95%   Vitals:   06/23/19 1200 06/23/19 1400 06/24/19 0024 06/24/19 0911  BP: (!) 105/39 (!) 114/92 (!) 116/56 113/61  Pulse:   69 79  Resp:  16 16 20   Temp:  98.6 F (37 C) (!) 97.5 F (36.4 C) 98.7 F (37.1 C)  TempSrc:  Rectal Oral   SpO2:   96% 95%  Weight:      Height:        General: Pt is alert, awake,  not in acute distress, obese Cardiovascular: RRR, S1/S2 +, no rubs, no gallops Respiratory: CTA bilaterally, no wheezing, no rhonchi Abdominal: Soft, NT, ND, bowel sounds + Extremities: mild LLE edema with none on RLE, venous stasis skin changes of distal BLE's, no cyanosis    The results of significant diagnostics from this hospitalization (including imaging, microbiology, ancillary and laboratory) are listed below for reference.     Microbiology: Recent Results (from the past 240 hour(s))  Urine culture     Status: None   Collection Time: 06/20/19 11:18 AM   Specimen: Urine, Random  Result Value Ref Range Status   Specimen Description   Final    URINE, RANDOM Performed at [facility], [address]  [place], [place] [address]    Special Requests   Final    NONE Performed at [facility], [address] [place], [place] [address]    Culture   Final    NO GROWTH Performed at [facility], [address] [place], [place] [address]    Report Status 06/23/2019 FINAL  Final  Respiratory Panel by RT PCR (Flu A&B, Covid) - Nasopharyngeal Swab     Status: None   Collection Time: 06/20/19 11:27 AM   Specimen: Nasopharyngeal Swab  Result Value Ref Range Status   SARS Coronavirus 2 by RT PCR NEGATIVE NEGATIVE Final    Comment: (NOTE) SARS-CoV-2 target nucleic acids are NOT DETECTED. The SARS-CoV-2 RNA is generally detectable in upper respiratoy specimens during the acute phase of infection. The lowest concentration of SARS-CoV-2 viral copies this assay can detect is 131 copies/mL. A negative result does not preclude SARS-Cov-2 infection and should not be used as the sole basis for treatment or other patient management decisions. A negative result may occur with  improper specimen collection/handling, submission of specimen other than nasopharyngeal swab, presence of viral mutation(s) within the areas targeted by this assay, and inadequate number of  viral copies (<131 copies/mL). A negative result must be combined with clinical observations, patient history, and epidemiological information. The expected result is Negative. Fact Sheet for Patients:  https://www.moore.com/https://www.fda.gov/media/142436/download Fact Sheet for Healthcare Providers:  https://www.young.biz/https://www.fda.gov/media/142435/download This test is not yet ap proved or cleared by the [place] FDA and  has been authorized for detection and/or diagnosis of SARS-CoV-2 by FDA under an Emergency Use Authorization (EUA). This EUA will remain  in effect (meaning this test can be used) for the duration of the COVID-19 declaration under Section 564(b)(1) of the Act, 21 U.S.C. section 360bbb-3(b)(1), unless the authorization is terminated or revoked sooner.    Influenza A by PCR NEGATIVE NEGATIVE Final   Influenza B by PCR NEGATIVE NEGATIVE Final    Comment: (NOTE) The Xpert Xpress SARS-CoV-2/FLU/RSV assay is intended as an aid in  the diagnosis of influenza from Nasopharyngeal swab specimens and  should not be used as a sole basis for treatment. Nasal washings and  aspirates are unacceptable for Xpert Xpress SARS-CoV-2/FLU/RSV  testing. Fact Sheet for Patients: https://www.moore.com/https://www.fda.gov/media/142436/download Fact Sheet for Healthcare Providers: https://www.young.biz/https://www.fda.gov/media/142435/download This test is not yet approved or cleared by the [place] FDA and  has been authorized for detection and/or diagnosis of SARS-CoV-2 by  FDA under an Emergency Use Authorization (EUA). This EUA will remain  in effect (meaning this test can be used) for the duration of the  Covid-19 declaration under Section 564(b)(1) of the Act, 21  U.S.C. section 360bbb-3(b)(1), unless the authorization is  terminated or revoked. Performed at [facility], [address] [place], [place] [address]   Blood culture (routine x 2)     Status: None (Preliminary result)   Collection Time: 06/20/19  4:35 PM   Specimen: BLOOD   Result Value Ref Range Status   Specimen Description BLOOD RT FOREARM  Final   Special Requests   Final    BOTTLES DRAWN AEROBIC AND ANAEROBIC Blood Culture adequate volume   Culture   Final    NO GROWTH 4 DAYS Performed at [facility], [address] [place], [place] [address]    Report Status PENDING  Incomplete  Blood culture (routine x 2)     Status: None (Preliminary result)   Collection Time: 06/20/19  4:35 PM   Specimen: BLOOD  Result Value Ref Range Status   Specimen Description BLOOD LEFT ARM  Final   Special Requests   Final    BOTTLES DRAWN AEROBIC AND ANAEROBIC Blood Culture adequate volume   Culture   Final    NO GROWTH 4 DAYS Performed at [facility], [address] [place], [place] [address]    Report Status PENDING  Incomplete     Labs: BNP (last 3 results) No results for input(s): BNP in the last 8760 hours. Basic Metabolic Panel: Recent Labs  Lab 06/20/19 0827 06/21/19 0445 06/22/19 0456 06/23/19 0434 06/24/19 0509  NA 139 139 140 141 140  K 3.9 4.1 4.0 4.0 3.8  CL 104 107 109 110 109  CO2 27 24 24 24 25   GLUCOSE 97 74 85 88 77  BUN 23 22 20 19 18   CREATININE 1.63* 1.47* 1.35* 1.16* 1.32*  CALCIUM 9.3 8.8* 8.8* 9.0 9.1  MG  --   --   --  2.2  --    Liver Function Tests: No results for input(s): AST, ALT, ALKPHOS, BILITOT, PROT, ALBUMIN in the last 168 hours. No results for input(s): LIPASE, AMYLASE in the last 168 hours. No results for input(s): AMMONIA in the last 168 hours. CBC: Recent Labs  Lab 06/20/19 0827 06/20/19 1049 06/21/19 0445 06/21/19 0445 06/22/19 0456 06/22/19 0456 06/22/19 1401 06/23/19 0434 06/23/19 1142 06/23/19 1543 06/24/19 0509  WBC 8.8  --  6.4  --  6.4  --   --  6.8  --   --  5.7  NEUTROABS 6.6  --   --   --   --   --   --   --   --   --   --   HGB 10.0*   < > 7.9*   < > 7.6*   < > 8.0* 7.6* 7.0* 7.6* 7.9*  HCT 30.9*   < > 24.4*   < > 23.1*   < > 24.9* 23.2* 20.7* 23.6* 23.4*  MCV 93.4   --  93.8  --  92.4  --   --  92.8  --   --  92.1  PLT 143*  --  151  --  161  --   --  184  --   --  198   < > = values in this interval not displayed.   Cardiac Enzymes: No results for input(s): CKTOTAL, CKMB, CKMBINDEX, TROPONINI in the last 168 hours. BNP: Invalid input(s): POCBNP CBG: No results for input(s): GLUCAP in the last 168 hours. D-Dimer No results for input(s): DDIMER in the last 72 hours. Hgb A1c No results for input(s): HGBA1C in the last 72 hours. Lipid Profile No results for input(s): CHOL, HDL, LDLCALC, TRIG, CHOLHDL, LDLDIRECT in the last 72 hours. Thyroid function studies No results for input(s): TSH, T4TOTAL, T3FREE, THYROIDAB in the last 72 hours.  Invalid input(s): FREET3 Anemia work up Recent Labs    06/23/19 0434  VITAMINB12 488  FOLATE 30.0  FERRITIN 101  TIBC 269  IRON 47  RETICCTPCT 2.4   Urinalysis    Component Value Date/Time   COLORURINE YELLOW (A) 06/20/2019 1118   APPEARANCEUR CLEAR (A) 06/20/2019 1118   APPEARANCEUR Hazy 10/25/2011 1219   LABSPEC 1.010 06/20/2019 1118   LABSPEC 1.023 10/25/2011 1219   PHURINE 7.0 06/20/2019 1118   GLUCOSEU NEGATIVE 06/20/2019 1118   GLUCOSEU Negative 10/25/2011 1219   HGBUR NEGATIVE 06/20/2019 1118   BILIRUBINUR NEGATIVE 06/20/2019 1118   BILIRUBINUR Negative  10/25/2011 1219   KETONESUR NEGATIVE 06/20/2019 1118   PROTEINUR NEGATIVE 06/20/2019 1118   NITRITE NEGATIVE 06/20/2019 1118   LEUKOCYTESUR NEGATIVE 06/20/2019 1118   LEUKOCYTESUR 1+ 10/25/2011 1219   Sepsis Labs Invalid input(s): PROCALCITONIN,  WBC,  LACTICIDVEN Microbiology Recent Results (from the past 240 hour(s))  Urine culture     Status: None   Collection Time: 06/20/19 11:18 AM   Specimen: Urine, Random  Result Value Ref Range Status   Specimen Description   Final    URINE, RANDOM Performed at [facility], [address] [place], [place] [address]    Special Requests   Final    NONE Performed at [facility], [address], [place], [place] [address]    Culture   Final    NO GROWTH Performed at [facility], [address], [place], [place] [address]    Report Status 06/23/2019 FINAL  Final  Respiratory Panel by RT PCR (Flu A&B, Covid) - Nasopharyngeal Swab     Status: None   Collection Time: 06/20/19 11:27 AM   Specimen: Nasopharyngeal Swab  Result Value Ref Range Status   SARS Coronavirus 2 by RT PCR NEGATIVE NEGATIVE Final    Comment: (NOTE) SARS-CoV-2 target nucleic acids are NOT DETECTED. The SARS-CoV-2 RNA is generally detectable in upper respiratoy specimens during the acute phase of infection. The lowest concentration of SARS-CoV-2 viral copies this assay can detect is 131 copies/mL. A negative result does not preclude SARS-Cov-2 infection and should not be used as the sole basis for treatment or other patient management decisions. A negative result may occur with  improper specimen collection/handling, submission of specimen other than nasopharyngeal swab, presence of viral mutation(s) within the areas targeted by this assay, and inadequate number of viral copies (<131 copies/mL). A negative result must be combined with clinical observations, patient history, and epidemiological information. The expected result is Negative. Fact Sheet for Patients:  https://www.moore.com/ Fact Sheet for Healthcare Providers:  https://www.young.biz/ This test is not yet ap proved or cleared by the [place] FDA and  has been authorized for detection and/or diagnosis of SARS-CoV-2 by FDA under an Emergency Use Authorization (EUA). This EUA will remain  in effect (meaning this test can be used) for the duration of the COVID-19 declaration under Section 564(b)(1) of the Act, 21 U.S.C. section 360bbb-3(b)(1), unless the authorization is terminated or revoked sooner.    Influenza A by PCR NEGATIVE NEGATIVE Final   Influenza B by PCR  NEGATIVE NEGATIVE Final    Comment: (NOTE) The Xpert Xpress SARS-CoV-2/FLU/RSV assay is intended as an aid in  the diagnosis of influenza from Nasopharyngeal swab specimens and  should not be used as a sole basis for treatment. Nasal washings and  aspirates are unacceptable for Xpert Xpress SARS-CoV-2/FLU/RSV  testing. Fact Sheet for Patients: https://www.moore.com/ Fact Sheet for Healthcare Providers: https://www.young.biz/ This test is not yet approved or cleared by the [place] FDA and  has been authorized for detection and/or diagnosis of SARS-CoV-2 by  FDA under an Emergency Use Authorization (EUA). This EUA will remain  in effect (meaning this test can be used) for the duration of the  Covid-19 declaration under Section 564(b)(1) of the Act, 21  U.S.C. section 360bbb-3(b)(1), unless the authorization is  terminated or revoked. Performed at [facility], [address] [place], [place] [address]   Blood culture (routine x 2)     Status: None (Preliminary result)   Collection Time: 06/20/19  4:35 PM   Specimen: BLOOD  Result Value Ref Range Status   Specimen Description BLOOD RT FOREARM  Final   Special Requests   Final    BOTTLES DRAWN AEROBIC AND ANAEROBIC Blood Culture adequate volume   Culture   Final    NO GROWTH 4 DAYS Performed at [facility], [address] [place], [place] [address]    Report Status PENDING  Incomplete  Blood culture (routine x 2)     Status: None (Preliminary result)   Collection Time: 06/20/19  4:35 PM   Specimen: BLOOD  Result Value Ref Range Status   Specimen Description BLOOD LEFT ARM  Final   Special Requests   Final    BOTTLES DRAWN AEROBIC AND ANAEROBIC Blood Culture adequate volume   Culture   Final    NO GROWTH 4 DAYS Performed at [facility], [address] [place], [place] [address]    Report Status PENDING  Incomplete     Time coordinating discharge:  Over 30 minutes  SIGNED:   Pennie Banter, DO [facility] 06/24/2019, 9:57 AM   If 7PM-7AM, please contact night-coverage [email or web address]

## 2019-06-24 NOTE — Progress Notes (Signed)
MD order received in [facility] to discharge pt back to [facility] with Home Health services; [facility] previously established Home Health PT, OT and RN services with [facility]; attempted to call report to [facility] [phone] left voicemail message with Maxine Glenn to call the floor here at [facility] [phone]; pt's discharge pending return phone call and then arrangement for EMS nonemergency transportation.

## 2019-06-24 NOTE — Progress Notes (Signed)
Report called to [facility] at [phone], spoke with [facility]; no questions voiced at this time; discharge pending arrival of EMS for nonemergency transport; EMS to be contacted by [facility]

## 2019-06-24 NOTE — TOC Transition Note (Signed)
Transition of Care [facility]) - CM/SW Discharge Note   Patient Details  Name: Megan Donaldson MRN: [id number] Date of Birth: 31-May-1927  Transition of Care [facility]) CM/SW Contact:  Maud Deed, LCSW Phone Number: 06/24/2019, 10:15 AM   Clinical Narrative:    Pt medically stable to return to [facility] per MD. CSW notified pt;s daughter of discharge and verified with facility. CSW notified Elnita Maxwell with [facility] of pt's discharge. Pt will be going to room A10 and call to report number is [phone].   Final next level of care: Assisted Living(with home health) Barriers to Discharge: No Barriers Identified   Patient Goals and CMS Choice Patient states their goals for this hospitalization and ongoing recovery are:: to return to [facility].gov Compare Post Acute Care list provided to:: Patient Represenative (must comment) Choice offered to / list presented to : Adult Children(daughter)  Discharge Placement              Patient chooses bed at: Other - please specify in the comment section below:([facility]) Patient to be transferred to facility by: EMS Name of family member notified: [name] Patient and family notified of of transfer: 06/24/19  Discharge Plan and Services   Discharge Planning Services: CM Consult Post Acute Care Choice: Resumption of Svcs/PTA Provider                    HH Arranged: RN, PT, OT [facility] Agency: [facility] Date [facility] Agency Contacted: 06/24/19 Time HH Agency Contacted: [phone] Representative spoke with at [facility] Agency: Elnita Maxwell  Social Determinants of Health (SDOH) Interventions     Readmission Risk Interventions Readmission Risk Prevention Plan 01/05/2019 11/13/2018  Medication Screening - Complete  Transportation Screening Complete Complete  HRI or Home Care Consult Complete -  Medication Review (RN Care Manager) Complete -  Some recent data might be hidden

## 2019-06-24 NOTE — Plan of Care (Signed)

## 2019-06-24 NOTE — Progress Notes (Signed)
EMS present for pt discharge; gave discharge packet to EMS personnel to take to [facility] facility; pt discharged via stretcher by EMS personnel

## 2019-06-25 LAB — CULTURE, BLOOD (ROUTINE X 2)
Culture: NO GROWTH
Culture: NO GROWTH
Special Requests: ADEQUATE
Special Requests: ADEQUATE

## 2019-08-01 ENCOUNTER — Encounter: Payer: Self-pay | Admitting: Emergency Medicine

## 2019-08-01 ENCOUNTER — Emergency Department: Payer: Medicare Other

## 2019-08-01 ENCOUNTER — Inpatient Hospital Stay
Admit: 2019-08-01 | Discharge: 2019-08-04 | DRG: 310 | Disposition: A | Discharge status: Hospice | Payer: Medicare Other | Source: Skilled Nursing Facility | Attending: Internal Medicine | Admitting: Internal Medicine

## 2019-08-01 ENCOUNTER — Other Ambulatory Visit: Payer: Self-pay

## 2019-08-01 DIAGNOSIS — E785 Hyperlipidemia, unspecified: Secondary | ICD-10-CM | POA: Diagnosis present

## 2019-08-01 DIAGNOSIS — I9589 Other hypotension: Secondary | ICD-10-CM | POA: Diagnosis not present

## 2019-08-01 DIAGNOSIS — Z66 Do not resuscitate: Secondary | ICD-10-CM | POA: Diagnosis present

## 2019-08-01 DIAGNOSIS — Z515 Encounter for palliative care: Secondary | ICD-10-CM | POA: Diagnosis not present

## 2019-08-01 DIAGNOSIS — I509 Heart failure, unspecified: Secondary | ICD-10-CM | POA: Diagnosis present

## 2019-08-01 DIAGNOSIS — D638 Anemia in other chronic diseases classified elsewhere: Secondary | ICD-10-CM | POA: Diagnosis present

## 2019-08-01 DIAGNOSIS — Z86711 Personal history of pulmonary embolism: Secondary | ICD-10-CM | POA: Diagnosis not present

## 2019-08-01 DIAGNOSIS — I472 Ventricular tachycardia: Principal | ICD-10-CM

## 2019-08-01 DIAGNOSIS — Z96641 Presence of right artificial hip joint: Secondary | ICD-10-CM | POA: Diagnosis present

## 2019-08-01 DIAGNOSIS — Z91040 Latex allergy status: Secondary | ICD-10-CM | POA: Diagnosis not present

## 2019-08-01 DIAGNOSIS — Z881 Allergy status to other antibiotic agents status: Secondary | ICD-10-CM | POA: Diagnosis not present

## 2019-08-01 DIAGNOSIS — F0390 Unspecified dementia without behavioral disturbance: Secondary | ICD-10-CM | POA: Diagnosis present

## 2019-08-01 DIAGNOSIS — Y92129 Unspecified place in nursing home as the place of occurrence of the external cause: Secondary | ICD-10-CM

## 2019-08-01 DIAGNOSIS — Z79890 Hormone replacement therapy (postmenopausal): Secondary | ICD-10-CM | POA: Diagnosis not present

## 2019-08-01 DIAGNOSIS — G309 Alzheimer's disease, unspecified: Secondary | ICD-10-CM | POA: Diagnosis not present

## 2019-08-01 DIAGNOSIS — F0280 Dementia in other diseases classified elsewhere without behavioral disturbance: Secondary | ICD-10-CM | POA: Diagnosis not present

## 2019-08-01 DIAGNOSIS — Z79899 Other long term (current) drug therapy: Secondary | ICD-10-CM | POA: Diagnosis not present

## 2019-08-01 DIAGNOSIS — Z6832 Body mass index (BMI) 32.0-32.9, adult: Secondary | ICD-10-CM | POA: Diagnosis not present

## 2019-08-01 DIAGNOSIS — Z888 Allergy status to other drugs, medicaments and biological substances status: Secondary | ICD-10-CM | POA: Diagnosis not present

## 2019-08-01 DIAGNOSIS — K219 Gastro-esophageal reflux disease without esophagitis: Secondary | ICD-10-CM | POA: Diagnosis present

## 2019-08-01 DIAGNOSIS — I10 Essential (primary) hypertension: Secondary | ICD-10-CM | POA: Diagnosis not present

## 2019-08-01 DIAGNOSIS — Z7982 Long term (current) use of aspirin: Secondary | ICD-10-CM

## 2019-08-01 DIAGNOSIS — E861 Hypovolemia: Secondary | ICD-10-CM | POA: Diagnosis not present

## 2019-08-01 DIAGNOSIS — R627 Adult failure to thrive: Secondary | ICD-10-CM | POA: Diagnosis present

## 2019-08-01 DIAGNOSIS — Z86718 Personal history of other venous thrombosis and embolism: Secondary | ICD-10-CM

## 2019-08-01 DIAGNOSIS — M810 Age-related osteoporosis without current pathological fracture: Secondary | ICD-10-CM | POA: Diagnosis present

## 2019-08-01 DIAGNOSIS — W19XXXA Unspecified fall, initial encounter: Secondary | ICD-10-CM | POA: Diagnosis present

## 2019-08-01 DIAGNOSIS — I110 Hypertensive heart disease with heart failure: Secondary | ICD-10-CM | POA: Diagnosis present

## 2019-08-01 DIAGNOSIS — Y92009 Unspecified place in unspecified non-institutional (private) residence as the place of occurrence of the external cause: Secondary | ICD-10-CM

## 2019-08-01 DIAGNOSIS — I959 Hypotension, unspecified: Secondary | ICD-10-CM | POA: Diagnosis present

## 2019-08-01 DIAGNOSIS — I4729 Other ventricular tachycardia: Secondary | ICD-10-CM

## 2019-08-01 LAB — CBC WITH DIFFERENTIAL/PLATELET
Abs Immature Granulocytes: 0.02 10*3/uL (ref 0.00–0.07)
Basophils Absolute: 0 10*3/uL (ref 0.0–0.1)
Basophils Relative: 1 %
Eosinophils Absolute: 0.1 10*3/uL (ref 0.0–0.5)
Eosinophils Relative: 2 %
HCT: 26.2 % — ABNORMAL LOW (ref 36.0–46.0)
Hemoglobin: 8.9 g/dL — ABNORMAL LOW (ref 12.0–15.0)
Immature Granulocytes: 0 %
Lymphocytes Relative: 35 %
Lymphs Abs: 2.2 10*3/uL (ref 0.7–4.0)
MCH: 31 pg (ref 26.0–34.0)
MCHC: 34 g/dL (ref 30.0–36.0)
MCV: 91.3 fL (ref 80.0–100.0)
Monocytes Absolute: 0.5 10*3/uL (ref 0.1–1.0)
Monocytes Relative: 8 %
Neutro Abs: 3.4 10*3/uL (ref 1.7–7.7)
Neutrophils Relative %: 54 %
Platelets: 194 10*3/uL (ref 150–400)
RBC: 2.87 MIL/uL — ABNORMAL LOW (ref 3.87–5.11)
RDW: 16.6 % — ABNORMAL HIGH (ref 11.5–15.5)
WBC: 6.2 10*3/uL (ref 4.0–10.5)
nRBC: 0 % (ref 0.0–0.2)

## 2019-08-01 LAB — COMPREHENSIVE METABOLIC PANEL
ALT: 17 U/L (ref 0–44)
AST: 31 U/L (ref 15–41)
Albumin: 3.3 g/dL — ABNORMAL LOW (ref 3.5–5.0)
Alkaline Phosphatase: 68 U/L (ref 38–126)
Anion gap: 8 (ref 5–15)
BUN: 19 mg/dL (ref 8–23)
CO2: 27 mmol/L (ref 22–32)
Calcium: 9.6 mg/dL (ref 8.9–10.3)
Chloride: 103 mmol/L (ref 98–111)
Creatinine, Ser: 1.09 mg/dL — ABNORMAL HIGH (ref 0.44–1.00)
GFR calc Af Amer: 52 mL/min — ABNORMAL LOW (ref >60)
GFR calc non Af Amer: 45 mL/min — ABNORMAL LOW (ref >60)
Glucose, Bld: 113 mg/dL — ABNORMAL HIGH (ref 70–99)
Potassium: 3.6 mmol/L (ref 3.5–5.1)
Sodium: 138 mmol/L (ref 135–145)
Total Bilirubin: 0.7 mg/dL (ref 0.3–1.2)
Total Protein: 6.1 g/dL — ABNORMAL LOW (ref 6.5–8.1)

## 2019-08-01 LAB — TROPONIN I (HIGH SENSITIVITY): Troponin I (High Sensitivity): 12 ng/L (ref <18)

## 2019-08-01 LAB — MAGNESIUM: Magnesium: 1.6 mg/dL — ABNORMAL LOW (ref 1.7–2.4)

## 2019-08-01 LAB — PROTIME-INR
INR: 1 (ref 0.8–1.2)
Prothrombin Time: 12.8 seconds (ref 11.4–15.2)

## 2019-08-01 MED ORDER — SODIUM CHLORIDE 0.9 % IV BOLUS
500.0000 mL | Freq: Once | INTRAVENOUS | Status: AC
  Administered 2019-08-01: 500 mL via INTRAVENOUS

## 2019-08-01 MED ORDER — ACETAMINOPHEN 650 MG RE SUPP: 650.0000 mg | Freq: Four times a day (QID) | RECTAL | Status: DC | PRN

## 2019-08-01 MED ORDER — MORPHINE SULFATE (CONCENTRATE) 10 MG/0.5ML PO SOLN: 5.0000 mg | ORAL | Status: DC | PRN

## 2019-08-01 MED ORDER — MAGNESIUM SULFATE 2 GM/50ML IV SOLN
2.0000 g | Freq: Once | INTRAVENOUS | Status: AC
  Administered 2019-08-01: 2 g via INTRAVENOUS
  Filled 2019-08-01: qty 50

## 2019-08-01 MED ORDER — SODIUM CHLORIDE 0.9 % IV SOLN: INTRAVENOUS | Status: DC

## 2019-08-01 MED ORDER — ORAL CARE MOUTH RINSE
15.0000 mL | Freq: Two times a day (BID) | OROMUCOSAL | Status: DC
  Administered 2019-08-02 – 2019-08-04 (×7): 15 mL via OROMUCOSAL

## 2019-08-01 MED ORDER — GLYCOPYRROLATE 0.2 MG/ML IJ SOLN
0.1000 mg | Freq: Four times a day (QID) | INTRAMUSCULAR | Status: DC | PRN
  Filled 2019-08-01: qty 0.5

## 2019-08-01 MED ORDER — LORAZEPAM 2 MG/ML IJ SOLN
1.0000 mg | INTRAMUSCULAR | Status: DC | PRN
  Administered 2019-08-01 – 2019-08-04 (×4): 1 mg via INTRAVENOUS
  Filled 2019-08-01 (×4): qty 1

## 2019-08-01 MED ORDER — MIDODRINE HCL 5 MG PO TABS
5.0000 mg | ORAL_TABLET | Freq: Three times a day (TID) | ORAL | Status: DC
  Administered 2019-08-01: 5 mg via ORAL
  Filled 2019-08-01 (×2): qty 1

## 2019-08-01 MED ORDER — MORPHINE SULFATE (PF) 2 MG/ML IV SOLN
INTRAVENOUS | Status: AC
  Filled 2019-08-01: qty 1

## 2019-08-01 MED ORDER — ACETAMINOPHEN 325 MG PO TABS
650.0000 mg | ORAL_TABLET | Freq: Four times a day (QID) | ORAL | Status: DC | PRN
  Administered 2019-08-01: 650 mg via ORAL
  Filled 2019-08-01: qty 2

## 2019-08-01 MED ORDER — SODIUM CHLORIDE 0.9 % IV BOLUS
1000.0000 mL | Freq: Once | INTRAVENOUS | Status: AC
  Administered 2019-08-01: 1000 mL via INTRAVENOUS

## 2019-08-01 MED ORDER — MORPHINE SULFATE (PF) 2 MG/ML IV SOLN
2.0000 mg | INTRAVENOUS | Status: DC | PRN
  Administered 2019-08-02 – 2019-08-04 (×7): 2 mg via INTRAVENOUS
  Filled 2019-08-01 (×7): qty 1

## 2019-08-01 MED ORDER — ENOXAPARIN SODIUM 40 MG/0.4ML ~~LOC~~ SOLN
40.0000 mg | SUBCUTANEOUS | Status: DC
  Administered 2019-08-01: 40 mg via SUBCUTANEOUS
  Filled 2019-08-01: qty 0.4

## 2019-08-01 NOTE — ED Notes (Signed)
Pt noted to be increasingly confused. Pt repeatedly sitting up and holding her knee. Pt repeatedly asking for "that man". Pt unable to state who the man is at this time. Pt repeatedly encouraged to relax, however pt noted to be increasingly agitated and confused.

## 2019-08-01 NOTE — ED Notes (Signed)
Pt now noted to be resting in bed comfortably at this time. Pt's daughter aware of changes in patient status. Monitor changed to comfort care to facilitate comfort.

## 2019-08-01 NOTE — ED Notes (Signed)
Pt noted to be become hypotensive after being sat up for apple sauce, pt laid back into prone position. Bladder scan performed by this RN and Tobi Bastos, RN due to no urine output since this RN arrived on shift. Bladder scan shows 361, admitting MD made aware.

## 2019-08-01 NOTE — ED Notes (Signed)
Repeat bladder scan performed by this RN. Pt's brief noted to be clean and dry. No urine noted in brief or on pure wick. Bruising noted to be worsening, bruising to R knee noted to be firm to palpation at this time.

## 2019-08-01 NOTE — ED Notes (Signed)
This RN notified by admitting MD spoke with pt's daughter, per admitting MD no pressors for BP, PO meds ordered, re-assess and possible comfort care if needed after speaking with daughter.

## 2019-08-01 NOTE — ED Notes (Signed)
Attempted to call report x 1  

## 2019-08-01 NOTE — Progress Notes (Signed)
PROGRESS NOTE    Megan Donaldson  IEP:[id number] DOB: 01-Jun-1927 DOA: 08/01/2019 PCP: Almetta Lovely, [facility]   Chief Complaint  Patient presents with  . Fall    Brief Narrative: 86 year old female with moderate dementia, hypertension, hyperlipidemia, history of PE not on anticoagulation, recent hospitalization 5 weeks back with hypotension of unclear etiology for which she was ruled out for sepsis and PE presented from the assisted living with unwitnessed fall.  Her systolic blood pressure was in the 80s.  She has similar symptoms 2 years ago when she was evaluated by EP and beta-blocker held due to sinus bradycardia. In the ED her blood pressure was initially 121/70 then dropped with systolic in the 90s.  She had 30 none of nonsustained V. tach but was asymptomatic.  Troponin was 12, EKG showed normal sinus rhythm without ST-T changes.  She had low magnesium of 1.6 and potassium of 3.6. Placed on observation.  Since admission she has remained hypotensive with blood pressure dropping down as low as 60s/40s.    Assessment & Plan:   Principal Problem: Hypotension Blood pressure persistently low dropping down to 60s/40s.  All home blood pressure medicine held.  Has received 1.5 L IV normal saline bolus and getting another liter bolus.  Noted to have developing leg edema.  Also ordered midodrine without improvement. I had detailed discussion with her daughter on the phone who recommends that patient maintain DNR status, no IV pressors and no aggressive measures if patient does not respond to fluid challenge. She agrees with making her full comfort if current measures fail to improve her blood pressure.    Active Problems: Nonsustained ventricular tachycardia (HCC) Currently asymptomatic.  Low magnesium and potassium replenished.  Cardiology consulted.  Fall at home, subsequent encounter Associated with V. tach and hypotension.  CT showed small left frontal hematoma without other acute  findings.  PT evaluation if blood pressure improves.  Anemia of chronic disease Hemoglobin at baseline.  History of pulmonary embolism Not on blood thinner.  VQ scan done last hospitalization negative for PE.  Dementia without behavioral disturbance Has moderate dementia.   DVT prophylaxis: Subcu Lovenox Code Status: DNR Family Communication: Spoke with daughter on the phone Disposition:   Status is: Observation  The patient remains OBS appropriate and will d/c before 2 midnights.  Dispo: The patient is from: ALF              Anticipated d/c is to: ALF              Anticipated d/c date is: 1 day              Patient currently is not medically stable to d/c.  Persistent hypertension       Consultants:   Cardiology   Procedures: CT head and cervical spine  Antimicrobials: Subjective: Seen and examined.  Patient oriented to place and person but confused during conversation.  Remains hypotensive.  Objective: Vitals:   08/01/19 1040 08/01/19 1050 08/01/19 1106 08/01/19 1241  BP: (!) 63/45 (!) 67/46 (!) 85/53 (!) 63/42  Pulse: 99 90 92 91  Resp: (!) 22 14 13  (!) 24  SpO2: 100% 99% 99% 100%  Weight:      Height:        Intake/Output Summary (Last 24 hours) at 08/01/2019 1253 Last data filed at 08/01/2019 1120 Gross per 24 hour  Intake 1209.24 ml  Output --  Net 1209.24 ml   Filed Weights   08/01/19 0147  Weight: 89.4  kg    Examination: General: Elderly female not in distress HEENT: Moist mucosa, supple neck Chest: Clear bilaterally CVs: S1-S2 normal, no murmurs GI: Soft, nondistended, nontender Musculoskeletal: Warm, trace edema bilaterally     Data Reviewed: I have personally reviewed following labs and imaging studies  CBC: Recent Labs  Lab 08/01/19 0149  WBC 6.2  NEUTROABS 3.4  HGB 8.9*  HCT 26.2*  MCV 91.3  PLT 161    Basic Metabolic Panel: Recent Labs  Lab 08/01/19 0149  NA 138  K 3.6  CL 103  CO2 27  GLUCOSE 113*  BUN 19   CREATININE 1.09*  CALCIUM 9.6  MG 1.6*    GFR: Estimated Creatinine Clearance: 38.7 mL/min (A) (by C-G formula based on SCr of 1.09 mg/dL (H)).  Liver Function Tests: Recent Labs  Lab 08/01/19 0149  AST 31  ALT 17  ALKPHOS 68  BILITOT 0.7  PROT 6.1*  ALBUMIN 3.3*    CBG: No results for input(s): GLUCAP in the last 168 hours.   No results found for this or any previous visit (from the past 240 hour(s)).       Radiology Studies: CT Head Wo Contrast  Result Date: 08/01/2019 CLINICAL DATA:  Unwitnessed fall EXAM: CT HEAD WITHOUT CONTRAST TECHNIQUE: Contiguous axial images were obtained from the base of the skull through the vertex without intravenous contrast. COMPARISON:  Jun 20, 2019 FINDINGS: Brain: No evidence of acute territorial infarction, hemorrhage, hydrocephalus,extra-axial collection or mass lesion/mass effect. There is dilatation the ventricles and sulci consistent with age-related atrophy. Low-attenuation changes in the deep white matter consistent with small vessel ischemia. Vascular: No hyperdense vessel or unexpected calcification. Skull: The skull is intact. No fracture or focal lesion identified. Sinuses/Orbits: The visualized paranasal sinuses and mastoid air cells are clear. The orbits and globes intact. Other: Small soft tissue hematoma seen overlying the left frontal skull and posterior right parietal skull. Cervical spine: Alignment: There is straightening of the normal cervical lordosis. A minimal 2 mm anterolisthesis is seen of C7 on T1. Skull base and vertebrae: Visualized skull base is intact. No atlanto-occipital dissociation. The vertebral body heights are well maintained. No fracture or pathologic osseous lesion seen. There is a loose body/rounded ossifications seen again posterior to the C2 vertebral body causing mild canal narrowing. Soft tissues and spinal canal: The visualized paraspinal soft tissues are unremarkable. No prevertebral soft tissue  swelling is seen. The spinal canal is grossly unremarkable, no large epidural collection or significant canal narrowing. Disc levels: Multilevel cervical spine spondylosis is noted with disc osteophyte complex and uncovertebral osteophytes most notable at C5-C6 with moderate neural foraminal narrowing and mild central canal stenosis. Upper chest: The lung apices are clear. Thoracic inlet is within normal limits. Other: None IMPRESSION: No acute intracranial abnormality. Findings consistent with age related atrophy and chronic small vessel ischemia No acute fracture or malalignment of the spine. Cervical spine spondylosis most notable at C5-C6 as above. Electronically Signed   By: Prudencio Pair M.D.   On: 08/01/2019 02:42   CT Cervical Spine Wo Contrast  Result Date: 08/01/2019 CLINICAL DATA:  Unwitnessed fall EXAM: CT HEAD WITHOUT CONTRAST TECHNIQUE: Contiguous axial images were obtained from the base of the skull through the vertex without intravenous contrast. COMPARISON:  Jun 20, 2019 FINDINGS: Brain: No evidence of acute territorial infarction, hemorrhage, hydrocephalus,extra-axial collection or mass lesion/mass effect. There is dilatation the ventricles and sulci consistent with age-related atrophy. Low-attenuation changes in the deep white matter consistent with small  vessel ischemia. Vascular: No hyperdense vessel or unexpected calcification. Skull: The skull is intact. No fracture or focal lesion identified. Sinuses/Orbits: The visualized paranasal sinuses and mastoid air cells are clear. The orbits and globes intact. Other: Small soft tissue hematoma seen overlying the left frontal skull and posterior right parietal skull. Cervical spine: Alignment: There is straightening of the normal cervical lordosis. A minimal 2 mm anterolisthesis is seen of C7 on T1. Skull base and vertebrae: Visualized skull base is intact. No atlanto-occipital dissociation. The vertebral body heights are well maintained. No  fracture or pathologic osseous lesion seen. There is a loose body/rounded ossifications seen again posterior to the C2 vertebral body causing mild canal narrowing. Soft tissues and spinal canal: The visualized paraspinal soft tissues are unremarkable. No prevertebral soft tissue swelling is seen. The spinal canal is grossly unremarkable, no large epidural collection or significant canal narrowing. Disc levels: Multilevel cervical spine spondylosis is noted with disc osteophyte complex and uncovertebral osteophytes most notable at C5-C6 with moderate neural foraminal narrowing and mild central canal stenosis. Upper chest: The lung apices are clear. Thoracic inlet is within normal limits. Other: None IMPRESSION: No acute intracranial abnormality. Findings consistent with age related atrophy and chronic small vessel ischemia No acute fracture or malalignment of the spine. Cervical spine spondylosis most notable at C5-C6 as above. Electronically Signed   By: Jonna Clark M.D.   On: 08/01/2019 02:42   DG Knee Complete 4 Views Right  Result Date: 08/01/2019 CLINICAL DATA:  Knee pain, swelling, fall EXAM: RIGHT KNEE - COMPLETE 4+ VIEW COMPARISON:  11/28/2018 FINDINGS: Advanced degenerative changes, most pronounced in the medial and lateral compartments with complete joint space loss and spurring. Small joint effusion. Anterior soft tissue swelling. No fracture, subluxation or dislocation. IMPRESSION: Advanced degenerative changes within the right knee. Small joint effusion. Anterior soft tissue swelling. No acute bony abnormality. Electronically Signed   By: Charlett Nose M.D.   On: 08/01/2019 02:21        Scheduled Meds: . enoxaparin (LOVENOX) injection  40 mg Subcutaneous Q24H  . midodrine  5 mg Oral TID WC   Continuous Infusions: . sodium chloride 75 mL/hr at 08/01/19 1120  . sodium chloride       LOS: 0 days    Time spent: 25 minutes    Dakotah Orrego, MD [facility]   To contact  the attending provider between 7A-7P or the covering provider during after hours 7P-7A, please log into the web site [email or web address] and access using universal [place] password for that web site. If you do not have the password, please call the hospital operator.  08/01/2019, 12:53 PM

## 2019-08-01 NOTE — ED Notes (Signed)
Message sent to pharmacy regarding verifying Midodrine. Pt continues to be confused and attempting to pull at side of bed and cords at this time.

## 2019-08-01 NOTE — ED Notes (Signed)
Pt's daughter to bedside to sit with patient at this time. This RN updated patient's daughter regarding plan of care. Pt's daughter states understanding at this time.

## 2019-08-01 NOTE — ED Provider Notes (Signed)
[facility] Emergency Department Provider Note ____________________________________________   First MD Initiated Contact with Patient 08/01/19 0150     (approximate)  I have reviewed the triage vital signs and the nursing notes.  Level 5 caveat history review of system limited secondary to dementia HISTORY  Chief Complaint Fall    HPI Megan Donaldson is a 86 y.o. female with below list of previous medical conditions including congestive heart failure pulmonary emboli and dementia presents emergency department via EMS from [facility] assisted living facility due to an unwitnessed fall.  EMS states that the patient was found on the floor with a systolic blood pressure of 80.  Patient denies any complaints at present.        Past Medical History:  Diagnosis Date  . CHF (congestive heart failure) (HCC)   . Dementia (HCC)   . GERD (gastroesophageal reflux disease)   . Hyperlipidemia   . Hypertension   . Osteoporosis   . Personal history of other venous thrombosis and embolism   . Personal history of PE (pulmonary embolism)     Patient Active Problem List   Diagnosis Date Noted  . Ventricular tachycardia, non-sustained (HCC) 08/01/2019  . Fall at home, initial encounter 08/01/2019  . NSVT (nonsustained ventricular tachycardia) (HCC) 08/01/2019  . Hypothermia 06/22/2019  . Generalized weakness 06/21/2019  . Constipation   . Dementia without behavioral disturbance (HCC)   . Conjunctivitis 01/04/2019  . Acute encephalopathy 01/04/2019  . Acute metabolic encephalopathy 01/03/2019  . Sepsis (HCC) 01/03/2019  . Acute lower UTI 01/03/2019  . Hypotension 01/03/2019  . Multiple open wounds of lower leg 01/03/2019  . Prolonged QT interval 01/03/2019  . Bilateral lower leg cellulitis 11/10/2018  . Acute gastric ulcer with hemorrhage 03/10/2018  . GI bleed 02/01/2018  . Altered mental status 09/17/2017  . Seizure (HCC) 08/07/2017  . Osteoporosis,  post-menopausal 01/23/2016  . Gastroesophageal reflux disease 10/22/2015  . Mild depression (HCC) 10/22/2015  . H/O: GI bleed 07/30/2015  . History of deep venous thrombosis 07/30/2015  . History of pulmonary embolism 07/30/2015  . 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 S/P closed reduction of dislocated total hip prosthesis 03/19/2015  . Essential hypertension 03/23/2014  . Hyperlipidemia 03/23/2014  . Hypothyroidism 03/23/2014    Past Surgical History:  Procedure Laterality Date  . ESOPHAGOGASTRODUODENOSCOPY N/A 02/02/2018   Procedure: ESOPHAGOGASTRODUODENOSCOPY (EGD);  Surgeon: Toledo, Boykin Nearing, MD;  Location: [facility] ENDOSCOPY;  Service: Gastroenterology;  Laterality: N/A;  . HIP CLOSED REDUCTION Right 12/21/2014   Procedure: CLOSED REDUCTION HIP;  Surgeon: Deeann Saint, MD;  Location: [facility] ORS;  Service: Orthopedics;  Laterality: Right;  . HIP CLOSED REDUCTION Right 09/01/2018   Procedure: CLOSED REDUCTION HIP;  Surgeon: Donato Heinz, MD;  Location: [facility] ORS;  Service: Orthopedics;  Laterality: Right;  . TOTAL HIP ARTHROPLASTY Right    x3    Prior to Admission medications   Medication Sig Start Date End Date Taking? Authorizing Provider  acetaminophen (TYLENOL) 500 MG tablet Take 1,000 mg by mouth 2 (two) times daily.    Yes [provider]  aspirin EC 81 MG tablet Take 81 mg by mouth daily.   Yes [provider]  Calcium Carbonate-Vitamin D3 (CALCIUM 600-D) 600-400 MG-UNIT TABS Take 1 tablet by mouth 2 (two) times daily.   Yes [provider]  escitalopram (LEXAPRO) 10 MG tablet Take 10 mg by mouth daily. 07/21/17  Yes [provider]  feeding supplement, ENSURE ENLIVE, (  ENSURE ENLIVE) LIQD Take 237 mLs by mouth 2 (two) times daily between meals. 01/07/19  Yes Enedina Finner, MD  ferrous sulfate 325 (65 FE) MG tablet Take 325 mg by mouth daily at 12 noon.   Yes [provider]  furosemide (LASIX) 20 MG tablet Take 1  tablet (20 mg total) by mouth daily as needed for edema. 06/24/19  Yes Esaw Grandchild A, DO  lamoTRIgine (LAMICTAL) 25 MG tablet Take 37.5 mg by mouth daily.    Yes [provider]  lamoTRIgine (LAMICTAL) 25 MG tablet Take 50 mg by mouth at bedtime.   Yes [provider]  levothyroxine (SYNTHROID, LEVOTHROID) 75 MCG tablet Take 75 mcg by mouth daily.   Yes [provider]  magnesium hydroxide (MILK OF MAGNESIA) 400 MG/5ML suspension Take 30 mLs by mouth daily as needed for mild constipation.   Yes [provider]  melatonin 3 MG TABS tablet Take 3 mg by mouth at bedtime.   Yes [provider]  Multiple Vitamin (MULTIVITAMIN WITH MINERALS) TABS tablet Take 1 tablet by mouth daily. 01/08/19  Yes Enedina Finner, MD  nystatin cream (MYCOSTATIN) Apply 1 application topically 2 (two) times daily as needed for dry skin. (apply to skin folds)   Yes [provider]  pantoprazole (PROTONIX) 40 MG tablet Take 40 mg by mouth daily.    Yes [provider]  Polyethylene Glycol 3350 (PEG 3350) 17 g PACK Take 17 g by mouth daily as needed (moderate constipation).    Yes [provider]  senna-docusate (SENOKOT-S) 8.6-50 MG tablet Take 2 tablets by mouth at bedtime.   Yes [provider]  simvastatin (ZOCOR) 40 MG tablet Take 40 mg by mouth at bedtime. 05/04/17  Yes [provider]  traZODone (DESYREL) 50 MG tablet Take 2 tablets (100 mg total) by mouth at bedtime as needed for sleep. 01/07/19  Yes Enedina Finner, MD  white petrolatum (VASELINE) GEL Apply 1 application topically 3 (three) times daily as needed for lip care.    Yes [provider]    Allergies Amoxicillin, Latex, Lisinopril, and Mometasone furoate  Family History  Problem Relation Age of Onset  . CAD Father     Social History Social History   Tobacco Use  . Smoking status: Never Smoker  . Smokeless tobacco: Never Used  Substance Use Topics  .  Alcohol use: No  . Drug use: No    Review of Systems Constitutional: No fever/chills Eyes: No visual changes. ENT: No sore throat. Cardiovascular: Denies chest pain. Respiratory: Denies shortness of breath. Gastrointestinal: No abdominal pain.  No nausea, no vomiting.  No diarrhea.  No constipation. Genitourinary: Negative for dysuria. Musculoskeletal: Negative for neck pain.  Negative for back pain. Integumentary: Negative for rash. Neurological: Negative for headaches, focal weakness or numbness.  ____________________________________________   PHYSICAL EXAM:  VITAL SIGNS: ED Triage Vitals  Enc Vitals Group     BP 08/01/19 0146 121/70     Pulse Rate 08/01/19 0146 85     Resp 08/01/19 0146 19     Temp --      Temp src --      SpO2 08/01/19 0146 97 %     Weight 08/01/19 0147 89.4 kg (197 lb 1.5 oz)     Height 08/01/19 0147 1.651 m (5\' 5" )     Head Circumference --      Peak Flow --      Pain Score 08/01/19 0151 7  Pain Loc --      Pain Edu? --      Excl. in [place]? --     Constitutional: Alert and oriented.  Eyes: Conjunctivae are normal.  Mouth/Throat: Patient is wearing a mask. Neck: No stridor.  No meningeal signs.   Cardiovascular: Normal rate, regular rhythm. Good peripheral circulation. Grossly normal heart sounds. Respiratory: Normal respiratory effort.  No retractions. Gastrointestinal: Soft and nontender. No distention.  Musculoskeletal: No lower extremity tenderness nor edema. No gross deformities of extremities. Neurologic:  Normal speech and language. No gross focal neurologic deficits are appreciated.  Skin:  Skin is warm, dry and intact. Psychiatric: Mood and affect are normal. Speech and behavior are normal.  ____________________________________________   LABS (all labs ordered are listed, but only abnormal results are displayed)  Labs Reviewed  CBC WITH DIFFERENTIAL/PLATELET - Abnormal; Notable for the following components:      Result Value    RBC 2.87 (*)    Hemoglobin 8.9 (*)    HCT 26.2 (*)    RDW 16.6 (*)    All other components within normal limits  COMPREHENSIVE METABOLIC PANEL - Abnormal; Notable for the following components:   Glucose, Bld 113 (*)    Creatinine, Ser 1.09 (*)    Total Protein 6.1 (*)    Albumin 3.3 (*)    GFR calc non Af Amer 45 (*)    GFR calc Af Amer 52 (*)    All other components within normal limits  MAGNESIUM - Abnormal; Notable for the following components:   Magnesium 1.6 (*)    All other components within normal limits  PROTIME-INR  URINALYSIS, COMPLETE (UACMP) WITH MICROSCOPIC  TROPONIN I (HIGH SENSITIVITY)   ____________________________________________  EKG   ED ECG REPORT I, Floyd N Chandlar Guice, the attending physician, personally viewed and interpreted this ECG.   Date: 08/01/2019  EKG Time: 1:46 AM  Rate: 84  Rhythm: Normal sinus rhythm  Axis: Left axis deviation  intervals: Prolonged PR interval  ST&T Change: None ___________________________________  RADIOLOGY I,  N Ammie Warrick, personally viewed and evaluated these images (plain radiographs) as part of my medical decision making, as well as reviewing the written report by the radiologist.  ED MD interpretation: No acute intracranial abnormality noted on CT head no acute fracture or malalignment of the cervical spine noted on CT.  Official radiology report(s): CT Head Wo Contrast  Result Date: 08/01/2019 CLINICAL DATA:  Unwitnessed fall EXAM: CT HEAD WITHOUT CONTRAST TECHNIQUE: Contiguous axial images were obtained from the base of the skull through the vertex without intravenous contrast. COMPARISON:  Jun 20, 2019 FINDINGS: Brain: No evidence of acute territorial infarction, hemorrhage, hydrocephalus,extra-axial collection or mass lesion/mass effect. There is dilatation the ventricles and sulci consistent with age-related atrophy. Low-attenuation changes in the deep white matter consistent with small vessel ischemia.  Vascular: No hyperdense vessel or unexpected calcification. Skull: The skull is intact. No fracture or focal lesion identified. Sinuses/Orbits: The visualized paranasal sinuses and mastoid air cells are clear. The orbits and globes intact. Other: Small soft tissue hematoma seen overlying the left frontal skull and posterior right parietal skull. Cervical spine: Alignment: There is straightening of the normal cervical lordosis. A minimal 2 mm anterolisthesis is seen of C7 on T1. Skull base and vertebrae: Visualized skull base is intact. No atlanto-occipital dissociation. The vertebral body heights are well maintained. No fracture or pathologic osseous lesion seen. There is a loose body/rounded ossifications seen again posterior to the C2 vertebral body causing mild canal  narrowing. Soft tissues and spinal canal: The visualized paraspinal soft tissues are unremarkable. No prevertebral soft tissue swelling is seen. The spinal canal is grossly unremarkable, no large epidural collection or significant canal narrowing. Disc levels: Multilevel cervical spine spondylosis is noted with disc osteophyte complex and uncovertebral osteophytes most notable at C5-C6 with moderate neural foraminal narrowing and mild central canal stenosis. Upper chest: The lung apices are clear. Thoracic inlet is within normal limits. Other: None IMPRESSION: No acute intracranial abnormality. Findings consistent with age related atrophy and chronic small vessel ischemia No acute fracture or malalignment of the spine. Cervical spine spondylosis most notable at C5-C6 as above. Electronically Signed   By: Jonna ClarkBindu  Avutu M.D.   On: 08/01/2019 02:42   CT Cervical Spine Wo Contrast  Result Date: 08/01/2019 CLINICAL DATA:  Unwitnessed fall EXAM: CT HEAD WITHOUT CONTRAST TECHNIQUE: Contiguous axial images were obtained from the base of the skull through the vertex without intravenous contrast. COMPARISON:  Jun 20, 2019 FINDINGS: Brain: No evidence of  acute territorial infarction, hemorrhage, hydrocephalus,extra-axial collection or mass lesion/mass effect. There is dilatation the ventricles and sulci consistent with age-related atrophy. Low-attenuation changes in the deep white matter consistent with small vessel ischemia. Vascular: No hyperdense vessel or unexpected calcification. Skull: The skull is intact. No fracture or focal lesion identified. Sinuses/Orbits: The visualized paranasal sinuses and mastoid air cells are clear. The orbits and globes intact. Other: Small soft tissue hematoma seen overlying the left frontal skull and posterior right parietal skull. Cervical spine: Alignment: There is straightening of the normal cervical lordosis. A minimal 2 mm anterolisthesis is seen of C7 on T1. Skull base and vertebrae: Visualized skull base is intact. No atlanto-occipital dissociation. The vertebral body heights are well maintained. No fracture or pathologic osseous lesion seen. There is a loose body/rounded ossifications seen again posterior to the C2 vertebral body causing mild canal narrowing. Soft tissues and spinal canal: The visualized paraspinal soft tissues are unremarkable. No prevertebral soft tissue swelling is seen. The spinal canal is grossly unremarkable, no large epidural collection or significant canal narrowing. Disc levels: Multilevel cervical spine spondylosis is noted with disc osteophyte complex and uncovertebral osteophytes most notable at C5-C6 with moderate neural foraminal narrowing and mild central canal stenosis. Upper chest: The lung apices are clear. Thoracic inlet is within normal limits. Other: None IMPRESSION: No acute intracranial abnormality. Findings consistent with age related atrophy and chronic small vessel ischemia No acute fracture or malalignment of the spine. Cervical spine spondylosis most notable at C5-C6 as above. Electronically Signed   By: Jonna ClarkBindu  Avutu M.D.   On: 08/01/2019 02:42   DG Knee Complete 4 Views  Right  Result Date: 08/01/2019 CLINICAL DATA:  Knee pain, swelling, fall EXAM: RIGHT KNEE - COMPLETE 4+ VIEW COMPARISON:  11/28/2018 FINDINGS: Advanced degenerative changes, most pronounced in the medial and lateral compartments with complete joint space loss and spurring. Small joint effusion. Anterior soft tissue swelling. No fracture, subluxation or dislocation. IMPRESSION: Advanced degenerative changes within the right knee. Small joint effusion. Anterior soft tissue swelling. No acute bony abnormality. Electronically Signed   By: Charlett NoseKevin  Dover M.D.   On: 08/01/2019 02:21      Procedures   ____________________________________________   INITIAL IMPRESSION / MDM / ASSESSMENT AND PLAN / ED COURSE  As part of my medical decision making, I reviewed the following data within the electronic MEDICAL RECORD NUMBER  86 year old female presented with above-stated history and physical exam following unwitnessed fall.  Differential diagnosis including  but not limited to arrhythmia CVA electrolyte abnormality including hyponatremia, orthostatic hypotension accidental fall.  Lab data revealed a hemoglobin of 8.9 which is consistent with previous hemoglobin of 7.9 on 06/24/2019.  CT head revealed no acute intracranial abnormality.  While on the monitor the patient had a 13 beat run of ventricular tachycardia.  Such concern that this may be the etiology for the patient's syncopal episode.  Patient remained normotensive and alert during the event.  Patient discussed with Dr. Para March for hospital admission for further evaluation and management.     ____________________________________________  FINAL CLINICAL IMPRESSION(S) / ED DIAGNOSES  Final diagnoses:  Nonsustained ventricular tachycardia (HCC)     MEDICATIONS GIVEN DURING THIS VISIT:  Medications  enoxaparin (LOVENOX) injection 40 mg (40 mg Subcutaneous Given 08/01/19 0524)  0.9 %  sodium chloride infusion (has no administration in time range)   acetaminophen (TYLENOL) tablet 650 mg (has no administration in time range)    Or  acetaminophen (TYLENOL) suppository 650 mg (has no administration in time range)  magnesium sulfate IVPB 2 g 50 mL (2 g Intravenous New Bag/Given 08/01/19 0526)     ED Discharge Orders    None      *Please note:  Megan Donaldson was evaluated in Emergency Department on 08/01/2019 for the symptoms described in the history of present illness. She was evaluated in the context of the global COVID-19 pandemic, which necessitated consideration that the patient might be at risk for infection with the SARS-CoV-2 virus that causes COVID-19. Institutional protocols and algorithms that pertain to the evaluation of patients at risk for COVID-19 are in a state of rapid change based on information released by regulatory bodies including the CDC and federal and state organizations. These policies and algorithms were followed during the patient's care in the ED.  Some ED evaluations and interventions may be delayed as a result of limited staffing during the pandemic.*  Note:  This document was prepared using Dragon voice recognition software and may include unintentional dictation errors.   Darci Current, MD 08/01/19 [id number]

## 2019-08-01 NOTE — ED Notes (Signed)
This RN to bedside with pain medication due to patient c/o pain and repeatedly calling out. Pt adamantly refusing pain medication at this time.

## 2019-08-01 NOTE — ED Notes (Signed)
Patient sat up and fed applesauce at this time.  Patient tolerating well.

## 2019-08-01 NOTE — H&P (Signed)
History and Physical    Megan Donaldson OMV:[id number] DOB: 1927-12-29 DOA: 08/01/2019  PCP: Almetta Lovely, [facility]   Patient coming from: Assisted living  I have personally briefly reviewed patient's old medical records in [facility]  Chief Complaint: fall  HPI: Megan Donaldson is a 86 y.o. female with medical history significant for dementia, HTN, HLD, history of PE not on blood thinners, recently hospitalized from 06/20/2019 to 06/24/2019 with hypotension of unclear etiology, ruled out for sepsis who presents to the emergency room following a fall at her residence that was unwitnessed.  Reported systolic blood pressure was 80.  History is limited due to patient's dementia.  Chart review reveals similar episode back in 2019 for which she was evaluated by EP.  Beta-blocker was held at that time due to sinus bradycardia. ED Course: On arrival in the emergency room blood pressure was initially 121/70, going as low as 98/70 but then picking back up.  Vitals were otherwise normal.  While in the emergency room she had a 13 beat run of V. tach during which she was asymptomatic.  EKG otherwise showed no acute ST-T wave changes.  Troponin was 12.  Magnesium was low at 1.6.  Potassium within normal limits at 3.6.  Hospitalist consulted for admission. Review of Systems: Limited due to dementia  Past Medical History:  Diagnosis Date  . CHF (congestive heart failure) (HCC)   . Dementia (HCC)   . GERD (gastroesophageal reflux disease)   . Hyperlipidemia   . Hypertension   . Osteoporosis   . Personal history of other venous thrombosis and embolism   . Personal history of PE (pulmonary embolism)     Past Surgical History:  Procedure Laterality Date  . ESOPHAGOGASTRODUODENOSCOPY N/A 02/02/2018   Procedure: ESOPHAGOGASTRODUODENOSCOPY (EGD);  Surgeon: Toledo, Boykin Nearing, MD;  Location: [facility] ENDOSCOPY;  Service: Gastroenterology;  Laterality: N/A;  . HIP CLOSED REDUCTION Right 12/21/2014   Procedure:  CLOSED REDUCTION HIP;  Surgeon: Deeann Saint, MD;  Location: [facility] ORS;  Service: Orthopedics;  Laterality: Right;  . HIP CLOSED REDUCTION Right 09/01/2018   Procedure: CLOSED REDUCTION HIP;  Surgeon: Donato Heinz, MD;  Location: [facility] ORS;  Service: Orthopedics;  Laterality: Right;  . TOTAL HIP ARTHROPLASTY Right    x3     reports that she has never smoked. She has never used smokeless tobacco. She reports that she does not drink alcohol and does not use drugs.  Allergies  Allergen Reactions  . Amoxicillin     Has patient had a PCN reaction causing immediate rash, facial/tongue/throat swelling, SOB or lightheadedness with hypotension: Unknown Has patient had a PCN reaction causing severe rash involving mucus membranes or skin necrosis: Unknown Has patient had a PCN reaction that required hospitalization: Unknown Has patient had a PCN reaction occurring within the last 10 years: Unknown If all of the above answers are "NO", then may proceed with Cephalosporin use.   . Latex   . Lisinopril   . Mometasone Furoate     Family History  Problem Relation Age of Onset  . CAD Father      Prior to Admission medications   Medication Sig Start Date End Date Taking? Authorizing Provider  acetaminophen (TYLENOL) 500 MG tablet Take 1,000 mg by mouth 2 (two) times daily.    Yes [provider]  aspirin EC 81 MG tablet Take 81 mg by mouth daily.   Yes [provider]  Calcium Carbonate-Vitamin D3 (CALCIUM 600-D) 600-400 MG-UNIT TABS Take  1 tablet by mouth 2 (two) times daily.   Yes [provider]  escitalopram (LEXAPRO) 10 MG tablet Take 10 mg by mouth daily. 07/21/17  Yes [provider]  feeding supplement, ENSURE ENLIVE, (ENSURE ENLIVE) LIQD Take 237 mLs by mouth 2 (two) times daily between meals. 01/07/19  Yes Enedina Finner, MD  ferrous sulfate 325 (65 FE) MG tablet Take 325 mg by mouth daily at 12 noon.   Yes [provider]  furosemide (LASIX) 20  MG tablet Take 1 tablet (20 mg total) by mouth daily as needed for edema. 06/24/19  Yes Esaw Grandchild A, DO  lamoTRIgine (LAMICTAL) 25 MG tablet Take 37.5 mg by mouth daily.    Yes [provider]  lamoTRIgine (LAMICTAL) 25 MG tablet Take 50 mg by mouth at bedtime.   Yes [provider]  levothyroxine (SYNTHROID, LEVOTHROID) 75 MCG tablet Take 75 mcg by mouth daily.   Yes [provider]  magnesium hydroxide (MILK OF MAGNESIA) 400 MG/5ML suspension Take 30 mLs by mouth daily as needed for mild constipation.   Yes [provider]  melatonin 3 MG TABS tablet Take 3 mg by mouth at bedtime.   Yes [provider]  Multiple Vitamin (MULTIVITAMIN WITH MINERALS) TABS tablet Take 1 tablet by mouth daily. 01/08/19  Yes Enedina Finner, MD  nystatin cream (MYCOSTATIN) Apply 1 application topically 2 (two) times daily as needed for dry skin. (apply to skin folds)   Yes [provider]  pantoprazole (PROTONIX) 40 MG tablet Take 40 mg by mouth daily.    Yes [provider]  Polyethylene Glycol 3350 (PEG 3350) 17 g PACK Take 17 g by mouth daily as needed (moderate constipation).    Yes [provider]  senna-docusate (SENOKOT-S) 8.6-50 MG tablet Take 2 tablets by mouth at bedtime.   Yes [provider]  simvastatin (ZOCOR) 40 MG tablet Take 40 mg by mouth at bedtime. 05/04/17  Yes [provider]  traZODone (DESYREL) 50 MG tablet Take 2 tablets (100 mg total) by mouth at bedtime as needed for sleep. 01/07/19  Yes Enedina Finner, MD  white petrolatum (VASELINE) GEL Apply 1 application topically 3 (three) times daily as needed for lip care.    Yes [provider]    Physical Exam: Vitals:   08/01/19 0206 08/01/19 0230 08/01/19 0300 08/01/19 0330  BP: 105/70 (!) 116/98 116/72 (!) 135/94  Pulse: 81 86 73 95  Resp: 12 17 13  (!) 21  SpO2: 98% 97% 93% 100%  Weight:      Height:         Vitals:   08/01/19 0206 08/01/19  0230 08/01/19 0300 08/01/19 0330  BP: 105/70 (!) 116/98 116/72 (!) 135/94  Pulse: 81 86 73 95  Resp: 12 17 13  (!) 21  SpO2: 98% 97% 93% 100%  Weight:      Height:          Constitutional: Alert and oriented x 1 . Not in any apparent distress HEENT:      Head: Normocephalic and atraumatic.         Eyes: PERLA, EOMI, Conjunctivae are normal. Sclera is non-icteric.       Mouth/Throat: Mucous membranes are moist.       Neck: Supple with no signs of meningismus. Cardiovascular: Regular rate and rhythm. No murmurs, gallops, or rubs. 2+ symmetrical distal pulses are present . No JVD. No LE edema Respiratory: Respiratory effort normal .Lungs sounds clear bilaterally. No wheezes,  crackles, or rhonchi.  Gastrointestinal: Soft, non tender, and non distended with positive bowel sounds. No rebound or guarding. Genitourinary: No CVA tenderness. Musculoskeletal: Nontender with normal range of motion in all extremities. No edema, cyanosis, or erythema of extremities. Neurologic: Normal speech and language. Face is symmetric. Moving all extremities. No gross focal neurologic deficits . Skin: Skin is warm, dry.  No rash or ulcers Psychiatric: Mood and affect are normal Speech and behavior are normal   Labs on Admission: I have personally reviewed following labs and imaging studies  CBC: Recent Labs  Lab 08/01/19 0149  WBC 6.2  NEUTROABS 3.4  HGB 8.9*  HCT 26.2*  MCV 91.3  PLT 782   Basic Metabolic Panel: Recent Labs  Lab 08/01/19 0149  NA 138  K 3.6  CL 103  CO2 27  GLUCOSE 113*  BUN 19  CREATININE 1.09*  CALCIUM 9.6  MG 1.6*   GFR: Estimated Creatinine Clearance: 38.7 mL/min (A) (by C-G formula based on SCr of 1.09 mg/dL (H)). Liver Function Tests: Recent Labs  Lab 08/01/19 0149  AST 31  ALT 17  ALKPHOS 68  BILITOT 0.7  PROT 6.1*  ALBUMIN 3.3*   No results for input(s): LIPASE, AMYLASE in the last 168 hours. No results for input(s): AMMONIA in the last 168  hours. Coagulation Profile: Recent Labs  Lab 08/01/19 0149  INR 1.0   Cardiac Enzymes: No results for input(s): CKTOTAL, CKMB, CKMBINDEX, TROPONINI in the last 168 hours. BNP (last 3 results) No results for input(s): PROBNP in the last 8760 hours. HbA1C: No results for input(s): HGBA1C in the last 72 hours. CBG: No results for input(s): GLUCAP in the last 168 hours. Lipid Profile: No results for input(s): CHOL, HDL, LDLCALC, TRIG, CHOLHDL, LDLDIRECT in the last 72 hours. Thyroid Function Tests: No results for input(s): TSH, T4TOTAL, FREET4, T3FREE, THYROIDAB in the last 72 hours. Anemia Panel: No results for input(s): VITAMINB12, FOLATE, FERRITIN, TIBC, IRON, RETICCTPCT in the last 72 hours. Urine analysis:    Component Value Date/Time   COLORURINE YELLOW (A) 06/20/2019 1118   APPEARANCEUR CLEAR (A) 06/20/2019 1118   APPEARANCEUR Hazy 10/25/2011 1219   LABSPEC 1.010 06/20/2019 1118   LABSPEC 1.023 10/25/2011 1219   PHURINE 7.0 06/20/2019 1118   GLUCOSEU NEGATIVE 06/20/2019 1118   GLUCOSEU Negative 10/25/2011 1219   HGBUR NEGATIVE 06/20/2019 1118   BILIRUBINUR NEGATIVE 06/20/2019 1118   BILIRUBINUR Negative 10/25/2011 1219   KETONESUR NEGATIVE 06/20/2019 1118   PROTEINUR NEGATIVE 06/20/2019 1118   NITRITE NEGATIVE 06/20/2019 1118   LEUKOCYTESUR NEGATIVE 06/20/2019 1118   LEUKOCYTESUR 1+ 10/25/2011 1219    Radiological Exams on Admission: CT Head Wo Contrast  Result Date: 08/01/2019 CLINICAL DATA:  Unwitnessed fall EXAM: CT HEAD WITHOUT CONTRAST TECHNIQUE: Contiguous axial images were obtained from the base of the skull through the vertex without intravenous contrast. COMPARISON:  Jun 20, 2019 FINDINGS: Brain: No evidence of acute territorial infarction, hemorrhage, hydrocephalus,extra-axial collection or mass lesion/mass effect. There is dilatation the ventricles and sulci consistent with age-related atrophy. Low-attenuation changes in the deep white matter consistent with  small vessel ischemia. Vascular: No hyperdense vessel or unexpected calcification. Skull: The skull is intact. No fracture or focal lesion identified. Sinuses/Orbits: The visualized paranasal sinuses and mastoid air cells are clear. The orbits and globes intact. Other: Small soft tissue hematoma seen overlying the left frontal skull and posterior right parietal skull. Cervical spine: Alignment: There is straightening of the normal cervical lordosis. A minimal 2 mm anterolisthesis  is seen of C7 on T1. Skull base and vertebrae: Visualized skull base is intact. No atlanto-occipital dissociation. The vertebral body heights are well maintained. No fracture or pathologic osseous lesion seen. There is a loose body/rounded ossifications seen again posterior to the C2 vertebral body causing mild canal narrowing. Soft tissues and spinal canal: The visualized paraspinal soft tissues are unremarkable. No prevertebral soft tissue swelling is seen. The spinal canal is grossly unremarkable, no large epidural collection or significant canal narrowing. Disc levels: Multilevel cervical spine spondylosis is noted with disc osteophyte complex and uncovertebral osteophytes most notable at C5-C6 with moderate neural foraminal narrowing and mild central canal stenosis. Upper chest: The lung apices are clear. Thoracic inlet is within normal limits. Other: None IMPRESSION: No acute intracranial abnormality. Findings consistent with age related atrophy and chronic small vessel ischemia No acute fracture or malalignment of the spine. Cervical spine spondylosis most notable at C5-C6 as above. Electronically Signed   By: Jonna Clark M.D.   On: 08/01/2019 02:42   CT Cervical Spine Wo Contrast  Result Date: 08/01/2019 CLINICAL DATA:  Unwitnessed fall EXAM: CT HEAD WITHOUT CONTRAST TECHNIQUE: Contiguous axial images were obtained from the base of the skull through the vertex without intravenous contrast. COMPARISON:  Jun 20, 2019 FINDINGS:  Brain: No evidence of acute territorial infarction, hemorrhage, hydrocephalus,extra-axial collection or mass lesion/mass effect. There is dilatation the ventricles and sulci consistent with age-related atrophy. Low-attenuation changes in the deep white matter consistent with small vessel ischemia. Vascular: No hyperdense vessel or unexpected calcification. Skull: The skull is intact. No fracture or focal lesion identified. Sinuses/Orbits: The visualized paranasal sinuses and mastoid air cells are clear. The orbits and globes intact. Other: Small soft tissue hematoma seen overlying the left frontal skull and posterior right parietal skull. Cervical spine: Alignment: There is straightening of the normal cervical lordosis. A minimal 2 mm anterolisthesis is seen of C7 on T1. Skull base and vertebrae: Visualized skull base is intact. No atlanto-occipital dissociation. The vertebral body heights are well maintained. No fracture or pathologic osseous lesion seen. There is a loose body/rounded ossifications seen again posterior to the C2 vertebral body causing mild canal narrowing. Soft tissues and spinal canal: The visualized paraspinal soft tissues are unremarkable. No prevertebral soft tissue swelling is seen. The spinal canal is grossly unremarkable, no large epidural collection or significant canal narrowing. Disc levels: Multilevel cervical spine spondylosis is noted with disc osteophyte complex and uncovertebral osteophytes most notable at C5-C6 with moderate neural foraminal narrowing and mild central canal stenosis. Upper chest: The lung apices are clear. Thoracic inlet is within normal limits. Other: None IMPRESSION: No acute intracranial abnormality. Findings consistent with age related atrophy and chronic small vessel ischemia No acute fracture or malalignment of the spine. Cervical spine spondylosis most notable at C5-C6 as above. Electronically Signed   By: Jonna Clark M.D.   On: 08/01/2019 02:42   DG Knee  Complete 4 Views Right  Result Date: 08/01/2019 CLINICAL DATA:  Knee pain, swelling, fall EXAM: RIGHT KNEE - COMPLETE 4+ VIEW COMPARISON:  11/28/2018 FINDINGS: Advanced degenerative changes, most pronounced in the medial and lateral compartments with complete joint space loss and spurring. Small joint effusion. Anterior soft tissue swelling. No fracture, subluxation or dislocation. IMPRESSION: Advanced degenerative changes within the right knee. Small joint effusion. Anterior soft tissue swelling. No acute bony abnormality. Electronically Signed   By: Charlett Nose M.D.   On: 08/01/2019 02:21    EKG: Independently reviewed.   Assessment/Plan  Principal Problem:   Ventricular tachycardia, non-sustained (HCC) -Patient presents with a unwitnessed fall, noted to be hypotensive with systolic in the 80s, with 13 beat run of V. tach, asymptomatic while in the ER -Similar episode during her hospitalization in 2019. -Replete magnesium which was low at 1.6 to maintain mag over 2 -Continue to monitor magnesium and potassium -Continuous cardiac monitoring -Consider starting amiodarone if recurrent episodes -Cardiology consult    Hypotension -SBP in the 80s at her residence following her fall. -Patient recently hospitalized with hypotension of unclear etiology and was ruled out for sepsis and PE -IV hydration continue to monitor -Suspect related to runs of V. tach    Fall at home, initial encounter -Suspect related to hypotension and V. tach outlined above -Patient has small left frontal hematoma but head and C-spine CT with no acute findings -Fall precautions -Cool compresses to hematoma    Essential hypertension -Hold home antihypertensives in view of hypotension  Chronic anemia -At baseline at 8.9    History of pulmonary embolism -Not currently on anticoagulation    Dementia without behavioral disturbance (HCC) -Continue home meds     DVT prophylaxis: Lovenox  Code Status:  DNR Family Communication:  none  Disposition Plan: Back to previous home environment Consults called: Cardiology Status:obs    Andris BaumannHazel V Sekou Zuckerman MD [facility]     08/01/2019, 4:52 AM

## 2019-08-01 NOTE — ED Notes (Signed)
Pt repositioned in bed at this time. Pt noted to continue to be confused. Telling this RN that she needed her nurses "out there" to help her get up. This RN repeatedly explained to patient that she could not get up. Pt repositioned in bed with head slightly elevated at this time.

## 2019-08-01 NOTE — ED Notes (Signed)
Attempted to call report x2

## 2019-08-01 NOTE — ED Notes (Addendum)
Pt moved to hospital bed for comfort. Pt noted to become hypotensive again. Admitting MD made aware via secure chat, awaiting order. Pt repositioned in bed. BP cuff repositioned at this time. Pt continues to have obvious hallucinations, repeatedly calling out regarding the "red stuff".   This RN and Tobi Bastos, Charity fundraiser at bedside

## 2019-08-01 NOTE — ED Notes (Signed)
Bladder scan performed by this 380 in bladder. Pt resting in bed with daughter at bedside.

## 2019-08-01 NOTE — ED Notes (Signed)
Admitting MD at bedside, aware of patient's BP. Pt's BP interval increased to q15 mins by this RN. Per Admitting MD notify for further episodes of HTN.

## 2019-08-01 NOTE — ED Notes (Signed)
Admitting MD made aware that patient with no urine output at this time.

## 2019-08-01 NOTE — ED Notes (Signed)
BP cuff repositioned and moved to different arms. BP 63/42 is the 4th recheck after patient stopped moving and BP obtained while patient still. Admitting MD made aware of patient's BP.

## 2019-08-01 NOTE — ED Notes (Signed)
Admitting MD aware that patient's BP now in the 70's systolic, states he is going to call the daughter to confirm possible comfort care.

## 2019-08-01 NOTE — ED Notes (Signed)
This RN to bedside, introduced self to patient. Upon arrival to bedside, pt noted to be delirious, able to answer orientation questions however points off to cabinets and starts talking about "her", pt initially requested this RN to call her daughter, then stated "don't bother she won't answer", then repeatedly stated she need to call her daughter. This RN explained would call her daughter for her. Pt repositioned in bed. Initial BP check to R arm showed patient hypotensive with systolic in the 80's, pt's BP cuff adjusted and moved to L arm and repeated, pt's BP 81/43. Pt's brief noted to be clean and dry at this time. Pt with noted bruising and swelling to  R knee at this time, bruising to L forehead, and laceration that is dressed by previous shift RN to R forearm, no bleeding noted to bandage.    Admitting MD made aware of patient's neurological status and hypotension via secure chat, states will review chart.

## 2019-08-01 NOTE — Consult Note (Signed)
CARDIOLOGY CONSULT NOTE               Patient ID: Megan Donaldson MRN: [id number] DOB/AGE: [birth year] 86 y.o.  Admit date: 08/01/2019 Referring Physician Dr. Judd Gaudier  Primary Physician [facility]  Primary Cardiologist  Reason for Consultation Ventricular tachycardia   HPI: Megan Donaldson is an 86 year old female with a past medical history significant for a PE, currently not on anticoagulation, hyperlipidemia, hypertension, and dementia who presented to the ED on 08/01/19 following a fall.  Workup was significant for hemoglobin of 8.9, high sensitivity troponin negative x 1, magnesium 1.6, and head CT revealing no evidence of acute intracranial abnormalities. While in the ED, she was noted to have a 13 beat run of ventricular tachycardia but appeared asymptomatic from this.  She has also been hypotensive, dropping to the 60s/40s with poor response to IV fluids.   History is limited from underlying dementia, but she appears to deny chest pain or shortness of breath.   Review of systems complete and found to be negative unless listed above     Past Medical History:  Diagnosis Date  . CHF (congestive heart failure) ([place])   . Dementia ([place])   . GERD (gastroesophageal reflux disease)   . Hyperlipidemia   . Hypertension   . Osteoporosis   . Personal history of other venous thrombosis and embolism   . Personal history of PE (pulmonary embolism)     Past Surgical History:  Procedure Laterality Date  . ESOPHAGOGASTRODUODENOSCOPY N/A 02/02/2018   Procedure: ESOPHAGOGASTRODUODENOSCOPY (EGD);  Surgeon: Toledo, Benay Pike, MD;  Location: [facility] ENDOSCOPY;  Service: Gastroenterology;  Laterality: N/A;  . HIP CLOSED REDUCTION Right 12/21/2014   Procedure: CLOSED REDUCTION HIP;  Surgeon: Earnestine Leys, MD;  Location: [facility] ORS;  Service: Orthopedics;  Laterality: Right;  . HIP CLOSED REDUCTION Right 09/01/2018   Procedure: CLOSED REDUCTION HIP;  Surgeon: Dereck Leep, MD;   Location: [facility] ORS;  Service: Orthopedics;  Laterality: Right;  . TOTAL HIP ARTHROPLASTY Right    x3    (Not in a hospital admission)  Social History   Socioeconomic History  . Marital status: Widowed    Spouse name: Not on file  . Number of children: Not on file  . Years of education: Not on file  . Highest education level: Not on file  Occupational History  . Occupation: retired  Tobacco Use  . Smoking status: Never Smoker  . Smokeless tobacco: Never Used  Substance and Sexual Activity  . Alcohol use: No  . Drug use: No  . Sexual activity: Not Currently  Other Topics Concern  . Not on file  Social History Narrative  . Not on file   Social Determinants of Health   Financial Resource Strain:   . Difficulty of Paying Living Expenses:   Food Insecurity:   . Worried About Charity fundraiser in the Last Year:   . Arboriculturist in the Last Year:   Transportation Needs:   . Film/video editor (Medical):   Marland Kitchen Lack of Transportation (Non-Medical):   Physical Activity:   . Days of Exercise per Week:   . Minutes of Exercise per Session:   Stress:   . Feeling of Stress :   Social Connections:   . Frequency of Communication with Friends and Family:   . Frequency of Social Gatherings with Friends and Family:   . Attends Religious Services:   . Active Member of Clubs or  Organizations:   . Attends Banker Meetings:   Marland Kitchen Marital Status:   Intimate Partner Violence:   . Fear of Current or Ex-Partner:   . Emotionally Abused:   Marland Kitchen Physically Abused:   . Sexually Abused:     Family History  Problem Relation Age of Onset  . CAD Father       Review of systems complete and found to be negative unless listed above      PHYSICAL EXAM  General: Well developed, well nourished, in no acute distress HEENT:  Normocephalic and atramatic Neck:  No JVD.  Lungs: Clear bilaterally to auscultation and percussion. Heart: HRRR . Normal S1 and S2 without gallops or  murmurs.  Abdomen: Bowel sounds are positive, abdomen soft and non-tender  Msk:  Back normal. Normal strength and tone for age. Extremities: No clubbing, cyanosis or edema.   Neuro: Demented  Psych:  Demented   Labs:   Lab Results  Component Value Date   WBC 6.2 08/01/2019   HGB 8.9 (L) 08/01/2019   HCT 26.2 (L) 08/01/2019   MCV 91.3 08/01/2019   PLT 194 08/01/2019    Recent Labs  Lab 08/01/19 0149  NA 138  K 3.6  CL 103  CO2 27  BUN 19  CREATININE 1.09*  CALCIUM 9.6  PROT 6.1*  BILITOT 0.7  ALKPHOS 68  ALT 17  AST 31  GLUCOSE 113*   Lab Results  Component Value Date   TROPONINI <0.03 09/17/2017   No results found for: CHOL No results found for: HDL No results found for: LDLCALC No results found for: TRIG No results found for: CHOLHDL No results found for: LDLDIRECT    Radiology: CT Head Wo Contrast  Result Date: 08/01/2019 CLINICAL DATA:  Unwitnessed fall EXAM: CT HEAD WITHOUT CONTRAST TECHNIQUE: Contiguous axial images were obtained from the base of the skull through the vertex without intravenous contrast. COMPARISON:  Jun 20, 2019 FINDINGS: Brain: No evidence of acute territorial infarction, hemorrhage, hydrocephalus,extra-axial collection or mass lesion/mass effect. There is dilatation the ventricles and sulci consistent with age-related atrophy. Low-attenuation changes in the deep white matter consistent with small vessel ischemia. Vascular: No hyperdense vessel or unexpected calcification. Skull: The skull is intact. No fracture or focal lesion identified. Sinuses/Orbits: The visualized paranasal sinuses and mastoid air cells are clear. The orbits and globes intact. Other: Small soft tissue hematoma seen overlying the left frontal skull and posterior right parietal skull. Cervical spine: Alignment: There is straightening of the normal cervical lordosis. A minimal 2 mm anterolisthesis is seen of C7 on T1. Skull base and vertebrae: Visualized skull base is intact. No  atlanto-occipital dissociation. The vertebral body heights are well maintained. No fracture or pathologic osseous lesion seen. There is a loose body/rounded ossifications seen again posterior to the C2 vertebral body causing mild canal narrowing. Soft tissues and spinal canal: The visualized paraspinal soft tissues are unremarkable. No prevertebral soft tissue swelling is seen. The spinal canal is grossly unremarkable, no large epidural collection or significant canal narrowing. Disc levels: Multilevel cervical spine spondylosis is noted with disc osteophyte complex and uncovertebral osteophytes most notable at C5-C6 with moderate neural foraminal narrowing and mild central canal stenosis. Upper chest: The lung apices are clear. Thoracic inlet is within normal limits. Other: None IMPRESSION: No acute intracranial abnormality. Findings consistent with age related atrophy and chronic small vessel ischemia No acute fracture or malalignment of the spine. Cervical spine spondylosis most notable at C5-C6 as above. Electronically Signed  By: Jonna Clark M.D.   On: 08/01/2019 02:42   CT Cervical Spine Wo Contrast  Result Date: 08/01/2019 CLINICAL DATA:  Unwitnessed fall EXAM: CT HEAD WITHOUT CONTRAST TECHNIQUE: Contiguous axial images were obtained from the base of the skull through the vertex without intravenous contrast. COMPARISON:  Jun 20, 2019 FINDINGS: Brain: No evidence of acute territorial infarction, hemorrhage, hydrocephalus,extra-axial collection or mass lesion/mass effect. There is dilatation the ventricles and sulci consistent with age-related atrophy. Low-attenuation changes in the deep white matter consistent with small vessel ischemia. Vascular: No hyperdense vessel or unexpected calcification. Skull: The skull is intact. No fracture or focal lesion identified. Sinuses/Orbits: The visualized paranasal sinuses and mastoid air cells are clear. The orbits and globes intact. Other: Small soft tissue  hematoma seen overlying the left frontal skull and posterior right parietal skull. Cervical spine: Alignment: There is straightening of the normal cervical lordosis. A minimal 2 mm anterolisthesis is seen of C7 on T1. Skull base and vertebrae: Visualized skull base is intact. No atlanto-occipital dissociation. The vertebral body heights are well maintained. No fracture or pathologic osseous lesion seen. There is a loose body/rounded ossifications seen again posterior to the C2 vertebral body causing mild canal narrowing. Soft tissues and spinal canal: The visualized paraspinal soft tissues are unremarkable. No prevertebral soft tissue swelling is seen. The spinal canal is grossly unremarkable, no large epidural collection or significant canal narrowing. Disc levels: Multilevel cervical spine spondylosis is noted with disc osteophyte complex and uncovertebral osteophytes most notable at C5-C6 with moderate neural foraminal narrowing and mild central canal stenosis. Upper chest: The lung apices are clear. Thoracic inlet is within normal limits. Other: None IMPRESSION: No acute intracranial abnormality. Findings consistent with age related atrophy and chronic small vessel ischemia No acute fracture or malalignment of the spine. Cervical spine spondylosis most notable at C5-C6 as above. Electronically Signed   By: Jonna Clark M.D.   On: 08/01/2019 02:42   DG Knee Complete 4 Views Right  Result Date: 08/01/2019 CLINICAL DATA:  Knee pain, swelling, fall EXAM: RIGHT KNEE - COMPLETE 4+ VIEW COMPARISON:  11/28/2018 FINDINGS: Advanced degenerative changes, most pronounced in the medial and lateral compartments with complete joint space loss and spurring. Small joint effusion. Anterior soft tissue swelling. No fracture, subluxation or dislocation. IMPRESSION: Advanced degenerative changes within the right knee. Small joint effusion. Anterior soft tissue swelling. No acute bony abnormality. Electronically Signed   By:  Charlett Nose M.D.   On: 08/01/2019 02:21    EKG: Sinus rhythm, no evidence of acute ischemia   ASSESSMENT AND PLAN:  1.  Ventricular tachycardia   -Short run, non-sustained   -Will continue to monitor electrolytes   -Due to family preference, underlying dementia, and DNR status, will defer further imaging/invasive workup at this time     The history, physical exam findings, and plan of care were all discussed with Dr. Harold Hedge, and all decision making was made in collaboration.   Signed: Andi Hence PA-C 08/01/2019, 7:44 AM

## 2019-08-01 NOTE — Plan of Care (Signed)
  Problem: Coping: Goal: Ability to identify and develop effective coping behavior will improve Outcome: Progressing   Problem: Clinical Measurements: Goal: Quality of life will improve Outcome: Progressing   Problem: Respiratory: Goal: Verbalizations of increased ease of respirations will increase Outcome: Progressing   Problem: Role Relationship: Goal: Family's ability to cope with current situation will improve Outcome: Progressing Goal: Ability to verbalize concerns, feelings, and thoughts to partner or family member will improve Outcome: Progressing   Problem: Pain Management: Goal: Satisfaction with pain management regimen will improve Outcome: Progressing   

## 2019-08-01 NOTE — ED Notes (Signed)
Pt observed picking at existing skin tear on R forearm. Area redressed and wrapped with gauze wrap.

## 2019-08-01 NOTE — ED Notes (Signed)
Cardiology PA at bedside to assess patient.

## 2019-08-01 NOTE — ED Notes (Signed)
Admitting MD acknowledged continued hypotension, no new orders. No urine output noted at this time.

## 2019-08-01 NOTE — ED Notes (Signed)
Urine specimen sent to the lab

## 2019-08-01 NOTE — Progress Notes (Signed)
But remains persistently hypotensive despite fluid challenge.  Discussed again with daughter who wishes for comfort measures as discussed earlier. Agree upon comfort care, no blood draws, IV fluids, antibiotics. P.o. Roxanol as needed for pain and shortness of breath, Ativan as needed for anxiety, glycopyrrolate as needed for secretions. Foley for comfort. With persistent hypotension anticipate hospital death.  Plan to reassess need for residential hospice in a.m.

## 2019-08-01 NOTE — ED Notes (Signed)
Pt's dress, sweater, shoes, glasses, watch, earrings placed in patient belongings bag and given to patient's daughter.

## 2019-08-01 NOTE — ED Triage Notes (Signed)
Pt arrives [facility] from [facility] w cc of unwitnessed fall. When found by staff pt had low BP (systolic in 80s). No hx hypotension. Pt has forehead hematoma, lac to R forearm measuring approx 1 inch. R knee also noted to be bruised and swollen. Pt received fluids and BP increased to 90s systolic

## 2019-08-01 NOTE — ED Notes (Addendum)
Pt brief changed. Purewick applied to pt. Pt fed applesauce and graham crackers. Given ice water.

## 2019-08-02 MED ORDER — POLYVINYL ALCOHOL 1.4 % OP SOLN
2.0000 [drp] | Freq: Three times a day (TID) | OPHTHALMIC | Status: DC
  Administered 2019-08-02 – 2019-08-04 (×7): 2 [drp] via OPHTHALMIC
  Filled 2019-08-02: qty 15

## 2019-08-02 NOTE — Progress Notes (Signed)
[facility] Card[facility]    SUBJECTIVE: Megan Donaldson is an 86 year old female with a past medical history significant for a PE, currently not on anticoagulation, hyperlipidemia, hypertension, and dementia who presented to the ED on 08/01/19 following a fall.  Workup was significant for hemoglobin of 8.9, high sensitivity troponin negative x 1, magnesium 1.6, and head CT revealing no evidence of acute intracranial abnormalities. While in the ED, she was noted to have a 13 beat run of ventricular tachycardia but appeared asymptomatic from this.  She has also been hypotensive, dropping to the 60s/40s with poor response to IV fluids.   History is limited from underlying dementia, but she appears to deny chest pain or shortness of breath.    Vitals:   08/01/19 1600 08/01/19 1624 08/01/19 2318 08/02/19 0729  BP: 98/68 (!) 94/55 (!) 95/50 (!) 74/59  Pulse: 81 71 63 73  Resp: (!) 25 15 20 18   Temp:   98 F (36.7 C) 97.9 F (36.6 C)  TempSrc:   Axillary   SpO2: 100% 100% 92% 97%  Weight:      Height:         Intake/Output Summary (Last 24 hours) at 08/02/2019 0745 Last data filed at 08/01/2019 2047 Gross per 24 hour  Intake 2554.47 ml  Output 375 ml  Net 2179.47 ml      PHYSICAL EXAM  General: Well developed, well nourished, in no acute distress HEENT:  Normocephalic and atramatic Neck:  No JVD.  Lungs: Clear bilaterally to auscultation and percussion. Heart: HRRR . Normal S1 and S2 without gallops or murmurs.  Abdomen: Bowel sounds are positive, abdomen soft and non-tender  Msk:  Back normal. Normal strength and tone for age. Extremities: No clubbing, cyanosis or edema.   Neuro: Demented  Psych:  Demented   LABS: Basic Metabolic Panel: Recent Labs    08/01/19 0149  NA 138  K 3.6  CL 103  CO2 27  GLUCOSE 113*  BUN 19  CREATININE 1.09*  CALCIUM 9.6  MG 1.6*   Liver Function Tests: Recent Labs    08/01/19 0149  AST 31  ALT 17  ALKPHOS 68  BILITOT 0.7  PROT 6.1*  ALBUMIN  3.3*   No results for input(s): LIPASE, AMYLASE in the last 72 hours. CBC: Recent Labs    08/01/19 0149  WBC 6.2  NEUTROABS 3.4  HGB 8.9*  HCT 26.2*  MCV 91.3  PLT 194   Cardiac Enzymes: No results for input(s): CKTOTAL, CKMB, CKMBINDEX, TROPONINI in the last 72 hours. BNP: Invalid input(s): POCBNP D-Dimer: No results for input(s): DDIMER in the last 72 hours. Hemoglobin A1C: No results for input(s): HGBA1C in the last 72 hours. Fasting Lipid Panel: No results for input(s): CHOL, HDL, LDLCALC, TRIG, CHOLHDL, LDLDIRECT in the last 72 hours. Thyroid Function Tests: No results for input(s): TSH, T4TOTAL, T3FREE, THYROIDAB in the last 72 hours.  Invalid input(s): FREET3 Anemia Panel: No results for input(s): VITAMINB12, FOLATE, FERRITIN, TIBC, IRON, RETICCTPCT in the last 72 hours.  CT Head Wo Contrast  Result Date: 08/01/2019 CLINICAL DATA:  Unwitnessed fall EXAM: CT HEAD WITHOUT CONTRAST TECHNIQUE: Contiguous axial images were obtained from the base of the skull through the vertex without intravenous contrast. COMPARISON:  Jun 20, 2019 FINDINGS: Brain: No evidence of acute territorial infarction, hemorrhage, hydrocephalus,extra-axial collection or mass lesion/mass effect. There is dilatation the ventricles and sulci consistent with age-related atrophy. Low-attenuation changes in the deep white matter consistent with small vessel ischemia. Vascular: No  hyperdense vessel or unexpected calcification. Skull: The skull is intact. No fracture or focal lesion identified. Sinuses/Orbits: The visualized paranasal sinuses and mastoid air cells are clear. The orbits and globes intact. Other: Small soft tissue hematoma seen overlying the left frontal skull and posterior right parietal skull. Cervical spine: Alignment: There is straightening of the normal cervical lordosis. A minimal 2 mm anterolisthesis is seen of C7 on T1. Skull base and vertebrae: Visualized skull base is intact. No  atlanto-occipital dissociation. The vertebral body heights are well maintained. No fracture or pathologic osseous lesion seen. There is a loose body/rounded ossifications seen again posterior to the C2 vertebral body causing mild canal narrowing. Soft tissues and spinal canal: The visualized paraspinal soft tissues are unremarkable. No prevertebral soft tissue swelling is seen. The spinal canal is grossly unremarkable, no large epidural collection or significant canal narrowing. Disc levels: Multilevel cervical spine spondylosis is noted with disc osteophyte complex and uncovertebral osteophytes most notable at C5-C6 with moderate neural foraminal narrowing and mild central canal stenosis. Upper chest: The lung apices are clear. Thoracic inlet is within normal limits. Other: None IMPRESSION: No acute intracranial abnormality. Findings consistent with age related atrophy and chronic small vessel ischemia No acute fracture or malalignment of the spine. Cervical spine spondylosis most notable at C5-C6 as above. Electronically Signed   By: Jonna Clark M.D.   On: 08/01/2019 02:42   CT Cervical Spine Wo Contrast  Result Date: 08/01/2019 CLINICAL DATA:  Unwitnessed fall EXAM: CT HEAD WITHOUT CONTRAST TECHNIQUE: Contiguous axial images were obtained from the base of the skull through the vertex without intravenous contrast. COMPARISON:  Jun 20, 2019 FINDINGS: Brain: No evidence of acute territorial infarction, hemorrhage, hydrocephalus,extra-axial collection or mass lesion/mass effect. There is dilatation the ventricles and sulci consistent with age-related atrophy. Low-attenuation changes in the deep white matter consistent with small vessel ischemia. Vascular: No hyperdense vessel or unexpected calcification. Skull: The skull is intact. No fracture or focal lesion identified. Sinuses/Orbits: The visualized paranasal sinuses and mastoid air cells are clear. The orbits and globes intact. Other: Small soft tissue  hematoma seen overlying the left frontal skull and posterior right parietal skull. Cervical spine: Alignment: There is straightening of the normal cervical lordosis. A minimal 2 mm anterolisthesis is seen of C7 on T1. Skull base and vertebrae: Visualized skull base is intact. No atlanto-occipital dissociation. The vertebral body heights are well maintained. No fracture or pathologic osseous lesion seen. There is a loose body/rounded ossifications seen again posterior to the C2 vertebral body causing mild canal narrowing. Soft tissues and spinal canal: The visualized paraspinal soft tissues are unremarkable. No prevertebral soft tissue swelling is seen. The spinal canal is grossly unremarkable, no large epidural collection or significant canal narrowing. Disc levels: Multilevel cervical spine spondylosis is noted with disc osteophyte complex and uncovertebral osteophytes most notable at C5-C6 with moderate neural foraminal narrowing and mild central canal stenosis. Upper chest: The lung apices are clear. Thoracic inlet is within normal limits. Other: None IMPRESSION: No acute intracranial abnormality. Findings consistent with age related atrophy and chronic small vessel ischemia No acute fracture or malalignment of the spine. Cervical spine spondylosis most notable at C5-C6 as above. Electronically Signed   By: Jonna Clark M.D.   On: 08/01/2019 02:42   DG Knee Complete 4 Views Right  Result Date: 08/01/2019 CLINICAL DATA:  Knee pain, swelling, fall EXAM: RIGHT KNEE - COMPLETE 4+ VIEW COMPARISON:  11/28/2018 FINDINGS: Advanced degenerative changes, most pronounced in the medial  and lateral compartments with complete joint space loss and spurring. Small joint effusion. Anterior soft tissue swelling. No fracture, subluxation or dislocation. IMPRESSION: Advanced degenerative changes within the right knee. Small joint effusion. Anterior soft tissue swelling. No acute bony abnormality. Electronically Signed   By:  Charlett Nose M.D.   On: 08/01/2019 02:21     ASSESSMENT AND PLAN:  Principal Problem:   Ventricular tachycardia, non-sustained (HCC) Active Problems:   Essential hypertension   History of pulmonary embolism   Hypotension   Dementia without behavioral disturbance (HCC)   Fall at home, initial encounter   NSVT (nonsustained ventricular tachycardia) (HCC)    1.  Ventricular tachycardia   -Short, non-sustained; patient was asymptomatic   -Currently off of telemetry   -Due to family preference, underlying dementia, and DNR status, will defer further imaging/invasive workup at this time   Signing off for now.  Call with further questions or concerns.   The history, physical exam findings, and plan of care were all discussed with Dr. Harold Hedge, and all decision making was made in collaboration.   Andi Hence  PA-C 08/02/2019 7:45 AM

## 2019-08-02 NOTE — TOC Progression Note (Signed)
Transition of Care [facility]) - Progression Note    Patient Details  Name: Megan Donaldson MRN: [id number] Date of Birth: [birth year]  [facility]) CM/SW Contact  Barrie Dunker, RN Phone Number: 08/02/2019, 12:36 PM  Clinical Narrative:    Spoke with the patient's daughter [name] She is interested in getting a bed at the [facility], the patients husband was at the facility in [place] and died 2 years ago 2022-08-11 the daughter wants the patient to go where her dad was I offered to contact the facility in [facility] and she said that she would like to go with [facility], I notified Clydie Braun with [facility] There is no bed available yet.        Expected Discharge Plan and Services                                                 Social Determinants of Health (SDOH) Interventions    Readmission Risk Interventions Readmission Risk Prevention Plan 01/05/2019 11/13/2018  Medication Screening - Complete  Transportation Screening Complete Complete  HRI or Home Care Consult Complete -  Medication Review (RN Care Manager) Complete -  Some recent data might be hidden

## 2019-08-02 NOTE — Progress Notes (Signed)
[facility] Room 154 [facility] Liaison RN note:  Received request from Marice Potter, [facility] CM/SW for family interest in hospice home. Spoke with daughter [name] in the room to acknowledge the referral. Chart reviewed and Ms Otterson has been approved for the hospice home.  Unfortunately, hospice home is not able to offer a room today. [name] and Deliliah Earl Lites is aware.   Sports coach will follow up tomorrow or sooner if room becomes available.  Please call with any hospice related questions or concerns.  Thank you for the opportunity to participate in this patient's care.  Cyndra Numbers, RN [facility] Liaison [phone]

## 2019-08-02 NOTE — Progress Notes (Signed)
PROGRESS NOTE    Megan Donaldson  NOM:[id number] DOB: [birth year] DOA: 08/01/2019 PCP: [facility]   Assessment & Plan:   Principal Problem:   Ventricular tachycardia, non-sustained ([place]) Active Problems:   Essential hypertension   History of pulmonary embolism   Hypotension   Dementia without behavioral disturbance ([place])   Fall at home, initial encounter   NSVT (nonsustained ventricular tachycardia) ([facility])  Ventricular tachycardia, non-sustained: noted to be hypotensive with systolic in the 96G, with 13 beat run of V. tach, asymptomatic while in the ER. S/p Mg given.   Hypotension: was previously on IVFs but pt's family decide to proceed w/ comfort care only   Fall: at home. Has small left frontal hematoma but head and C-spine CT with no acute findings. Fall precautions  Essential hypertension: hold home antihypertensives in view of hypotension  Chronic anemia: no need for a transfusion at this time   History of pulmonary embolism: not currently on anticoagulation as pt is on comfort care only   Dementia without behavioral disturbance: continue w/ supportive care    DVT prophylaxis: comfort care Code Status: DNR Family Communication: discussed pt's care w/ pt's daughter, Pamala Hurry, and answered her questions Disposition Plan: hospice consult via CM.    Consultants:   Hospice    Procedures:    Antimicrobials:   Subjective: Pt c/o pain  Objective: Vitals:   08/01/19 1600 08/01/19 1624 08/01/19 2318 08/02/19 0729  BP: 98/68 (!) 94/55 (!) 95/50 (!) 74/59  Pulse: 81 71 63 73  Resp: (!) 25 15 20 18   Temp:   98 F (36.7 C) 97.9 F (36.6 C)  TempSrc:   Axillary   SpO2: 100% 100% 92% 97%  Weight:      Height:        Intake/Output Summary (Last 24 hours) at 08/02/2019 0739 Last data filed at 08/01/2019 2047 Gross per 24 hour  Intake 2554.47 ml  Output 375 ml  Net 2179.47 ml   Filed Weights   08/01/19 0147  Weight: 89.4 kg     Examination:  General exam: Appears calm but uncomfortable  Respiratory system: diminished breath sounds b/l  Cardiovascular system: S1 & S2+. No rubs, gallops or clicks.  Gastrointestinal system: Abdomen is nondistended, soft and nontender. Normal bowel sounds heard. Central nervous system: lethargic. Moves all 4 extremities Psychiatry: Judgement and insight appear abnormal. Flat mood and affect     Data Reviewed: I have personally reviewed following labs and imaging studies  CBC: Recent Labs  Lab 08/01/19 0149  WBC 6.2  NEUTROABS 3.4  HGB 8.9*  HCT 26.2*  MCV 91.3  PLT 836   Basic Metabolic Panel: Recent Labs  Lab 08/01/19 0149  NA 138  K 3.6  CL 103  CO2 27  GLUCOSE 113*  BUN 19  CREATININE 1.09*  CALCIUM 9.6  MG 1.6*   GFR: Estimated Creatinine Clearance: 38.7 mL/min (A) (by C-G formula based on SCr of 1.09 mg/dL (H)). Liver Function Tests: Recent Labs  Lab 08/01/19 0149  AST 31  ALT 17  ALKPHOS 68  BILITOT 0.7  PROT 6.1*  ALBUMIN 3.3*   No results for input(s): LIPASE, AMYLASE in the last 168 hours. No results for input(s): AMMONIA in the last 168 hours. Coagulation Profile: Recent Labs  Lab 08/01/19 0149  INR 1.0   Cardiac Enzymes: No results for input(s): CKTOTAL, CKMB, CKMBINDEX, TROPONINI in the last 168 hours. BNP (last 3 results) No results for input(s): PROBNP in the last 8760 hours.  HbA1C: No results for input(s): HGBA1C in the last 72 hours. CBG: No results for input(s): GLUCAP in the last 168 hours. Lipid Profile: No results for input(s): CHOL, HDL, LDLCALC, TRIG, CHOLHDL, LDLDIRECT in the last 72 hours. Thyroid Function Tests: No results for input(s): TSH, T4TOTAL, FREET4, T3FREE, THYROIDAB in the last 72 hours. Anemia Panel: No results for input(s): VITAMINB12, FOLATE, FERRITIN, TIBC, IRON, RETICCTPCT in the last 72 hours. Sepsis Labs: No results for input(s): PROCALCITON, LATICACIDVEN in the last 168 hours.  No  results found for this or any previous visit (from the past 240 hour(s)).       Radiology Studies: CT Head Wo Contrast  Result Date: 08/01/2019 CLINICAL DATA:  Unwitnessed fall EXAM: CT HEAD WITHOUT CONTRAST TECHNIQUE: Contiguous axial images were obtained from the base of the skull through the vertex without intravenous contrast. COMPARISON:  Jun 20, 2019 FINDINGS: Brain: No evidence of acute territorial infarction, hemorrhage, hydrocephalus,extra-axial collection or mass lesion/mass effect. There is dilatation the ventricles and sulci consistent with age-related atrophy. Low-attenuation changes in the deep white matter consistent with small vessel ischemia. Vascular: No hyperdense vessel or unexpected calcification. Skull: The skull is intact. No fracture or focal lesion identified. Sinuses/Orbits: The visualized paranasal sinuses and mastoid air cells are clear. The orbits and globes intact. Other: Small soft tissue hematoma seen overlying the left frontal skull and posterior right parietal skull. Cervical spine: Alignment: There is straightening of the normal cervical lordosis. A minimal 2 mm anterolisthesis is seen of C7 on T1. Skull base and vertebrae: Visualized skull base is intact. No atlanto-occipital dissociation. The vertebral body heights are well maintained. No fracture or pathologic osseous lesion seen. There is a loose body/rounded ossifications seen again posterior to the C2 vertebral body causing mild canal narrowing. Soft tissues and spinal canal: The visualized paraspinal soft tissues are unremarkable. No prevertebral soft tissue swelling is seen. The spinal canal is grossly unremarkable, no large epidural collection or significant canal narrowing. Disc levels: Multilevel cervical spine spondylosis is noted with disc osteophyte complex and uncovertebral osteophytes most notable at C5-C6 with moderate neural foraminal narrowing and mild central canal stenosis. Upper chest: The lung apices  are clear. Thoracic inlet is within normal limits. Other: None IMPRESSION: No acute intracranial abnormality. Findings consistent with age related atrophy and chronic small vessel ischemia No acute fracture or malalignment of the spine. Cervical spine spondylosis most notable at C5-C6 as above. Electronically Signed   By: Jonna Clark M.D.   On: 08/01/2019 02:42   CT Cervical Spine Wo Contrast  Result Date: 08/01/2019 CLINICAL DATA:  Unwitnessed fall EXAM: CT HEAD WITHOUT CONTRAST TECHNIQUE: Contiguous axial images were obtained from the base of the skull through the vertex without intravenous contrast. COMPARISON:  Jun 20, 2019 FINDINGS: Brain: No evidence of acute territorial infarction, hemorrhage, hydrocephalus,extra-axial collection or mass lesion/mass effect. There is dilatation the ventricles and sulci consistent with age-related atrophy. Low-attenuation changes in the deep white matter consistent with small vessel ischemia. Vascular: No hyperdense vessel or unexpected calcification. Skull: The skull is intact. No fracture or focal lesion identified. Sinuses/Orbits: The visualized paranasal sinuses and mastoid air cells are clear. The orbits and globes intact. Other: Small soft tissue hematoma seen overlying the left frontal skull and posterior right parietal skull. Cervical spine: Alignment: There is straightening of the normal cervical lordosis. A minimal 2 mm anterolisthesis is seen of C7 on T1. Skull base and vertebrae: Visualized skull base is intact. No atlanto-occipital dissociation. The vertebral body heights  are well maintained. No fracture or pathologic osseous lesion seen. There is a loose body/rounded ossifications seen again posterior to the C2 vertebral body causing mild canal narrowing. Soft tissues and spinal canal: The visualized paraspinal soft tissues are unremarkable. No prevertebral soft tissue swelling is seen. The spinal canal is grossly unremarkable, no large epidural collection or  significant canal narrowing. Disc levels: Multilevel cervical spine spondylosis is noted with disc osteophyte complex and uncovertebral osteophytes most notable at C5-C6 with moderate neural foraminal narrowing and mild central canal stenosis. Upper chest: The lung apices are clear. Thoracic inlet is within normal limits. Other: None IMPRESSION: No acute intracranial abnormality. Findings consistent with age related atrophy and chronic small vessel ischemia No acute fracture or malalignment of the spine. Cervical spine spondylosis most notable at C5-C6 as above. Electronically Signed   By: Jonna Clark M.D.   On: 08/01/2019 02:42   DG Knee Complete 4 Views Right  Result Date: 08/01/2019 CLINICAL DATA:  Knee pain, swelling, fall EXAM: RIGHT KNEE - COMPLETE 4+ VIEW COMPARISON:  11/28/2018 FINDINGS: Advanced degenerative changes, most pronounced in the medial and lateral compartments with complete joint space loss and spurring. Small joint effusion. Anterior soft tissue swelling. No fracture, subluxation or dislocation. IMPRESSION: Advanced degenerative changes within the right knee. Small joint effusion. Anterior soft tissue swelling. No acute bony abnormality. Electronically Signed   By: Charlett Nose M.D.   On: 08/01/2019 02:21        Scheduled Meds: . mouth rinse  15 mL Mouth Rinse BID   Continuous Infusions:   LOS: 1 day    Time spent: 30 mins     Charise Killian, MD [facility] Pager [phone]  If 7PM-7AM, please contact night-coverage [email or web address] 08/02/2019, 7:39 AM

## 2019-08-03 DIAGNOSIS — G309 Alzheimer's disease, unspecified: Secondary | ICD-10-CM

## 2019-08-03 DIAGNOSIS — F0280 Dementia in other diseases classified elsewhere without behavioral disturbance: Secondary | ICD-10-CM

## 2019-08-03 DIAGNOSIS — R627 Adult failure to thrive: Secondary | ICD-10-CM

## 2019-08-03 DIAGNOSIS — E861 Hypovolemia: Secondary | ICD-10-CM

## 2019-08-03 DIAGNOSIS — I9589 Other hypotension: Secondary | ICD-10-CM

## 2019-08-03 MED ORDER — MORPHINE SULFATE (PF) 2 MG/ML IV SOLN
1.0000 mg | Freq: Three times a day (TID) | INTRAVENOUS | Status: DC
  Administered 2019-08-03 – 2019-08-04 (×2): 1 mg via INTRAVENOUS
  Filled 2019-08-03 (×2): qty 1

## 2019-08-03 NOTE — Progress Notes (Signed)
PROGRESS NOTE    Megan Donaldson  DPO:[id number] DOB: 1927-08-30 DOA: 08/01/2019 PCP: [facility]   Assessment & Plan:   Principal Problem:   Ventricular tachycardia, non-sustained ([facility]) Active Problems:   Essential hypertension   History of pulmonary embolism   Hypotension   Dementia without behavioral disturbance ([facility])   Fall at home, initial encounter   NSVT (nonsustained ventricular tachycardia) ([facility])  Failure to thrive: continue w/ comfort care. Started on IV morphine 1 mg Q8H and continue on prn morphine q1h   Ventricular tachycardia, non-sustained: noted to be hypotensive with systolic in the 80s, with 13 beat run of V. tach, asymptomatic while in the ER. S/p Mg given. Comfort care only   Hypotension: was previously on IVFs but pt's family decide to proceed w/ comfort care only   Fall: at home. Has small left frontal hematoma but head and C-spine CT with no acute findings. Fall precautions  Essential hypertension: hold home antihypertensives in view of hypotension  Chronic anemia: no need for a transfusion at this time   History of pulmonary embolism: not currently on anticoagulation as pt is on comfort care only   Dementia without behavioral disturbance: continue w/ supportive care    DVT prophylaxis: comfort care Code Status: DNR Family Communication: discussed pt's care w/ pt's daughter, [name], and answered her questions Disposition Plan: hospice home once a bed is available which it is not     Consultants:   Hospice    Procedures:    Antimicrobials:   Subjective: Pt c/o pain but cannot verbalize where  Objective: Vitals:   08/01/19 1600 08/01/19 1624 08/01/19 2318 08/02/19 0729  BP: 98/68 (!) 94/55 (!) 95/50 (!) 74/59  Pulse: 81 71 63 73  Resp: (!) 25 15 20 18   Temp:   98 F (36.7 C) 97.9 F (36.6 C)  TempSrc:   Axillary   SpO2: 100% 100% 92% 97%  Weight:      Height:        Intake/Output Summary (Last 24 hours) at  08/03/2019 0725 Last data filed at 08/02/2019 1910 Gross per 24 hour  Intake --  Output 300 ml  Net -300 ml   Filed Weights   08/01/19 0147  Weight: 89.4 kg    Examination:  General exam: Appears calm but uncomfortable  Respiratory system: decreased breath sounds b/l. No rales Cardiovascular system: S1 & S2+. No rubs, gallops or clicks.  Gastrointestinal system: Abdomen is nondistended, soft and nontender. Hypoactive bowel sounds heard. Central nervous system: lethargic. Moves all 4 extremities Psychiatry: Judgement and insight appear abnormal. Flat mood and affect     Data Reviewed: I have personally reviewed following labs and imaging studies  CBC: Recent Labs  Lab 08/01/19 0149  WBC 6.2  NEUTROABS 3.4  HGB 8.9*  HCT 26.2*  MCV 91.3  PLT 194   Basic Metabolic Panel: Recent Labs  Lab 08/01/19 0149  NA 138  K 3.6  CL 103  CO2 27  GLUCOSE 113*  BUN 19  CREATININE 1.09*  CALCIUM 9.6  MG 1.6*   GFR: Estimated Creatinine Clearance: 38.7 mL/min (A) (by C-G formula based on SCr of 1.09 mg/dL (H)). Liver Function Tests: Recent Labs  Lab 08/01/19 0149  AST 31  ALT 17  ALKPHOS 68  BILITOT 0.7  PROT 6.1*  ALBUMIN 3.3*   No results for input(s): LIPASE, AMYLASE in the last 168 hours. No results for input(s): AMMONIA in the last 168 hours. Coagulation Profile: Recent Labs  Lab 08/01/19 0149  INR 1.0   Cardiac Enzymes: No results for input(s): CKTOTAL, CKMB, CKMBINDEX, TROPONINI in the last 168 hours. BNP (last 3 results) No results for input(s): PROBNP in the last 8760 hours. HbA1C: No results for input(s): HGBA1C in the last 72 hours. CBG: No results for input(s): GLUCAP in the last 168 hours. Lipid Profile: No results for input(s): CHOL, HDL, LDLCALC, TRIG, CHOLHDL, LDLDIRECT in the last 72 hours. Thyroid Function Tests: No results for input(s): TSH, T4TOTAL, FREET4, T3FREE, THYROIDAB in the last 72 hours. Anemia Panel: No results for input(s):  VITAMINB12, FOLATE, FERRITIN, TIBC, IRON, RETICCTPCT in the last 72 hours. Sepsis Labs: No results for input(s): PROCALCITON, LATICACIDVEN in the last 168 hours.  No results found for this or any previous visit (from the past 240 hour(s)).       Radiology Studies: No results found.      Scheduled Meds: . mouth rinse  15 mL Mouth Rinse BID  . polyvinyl alcohol  2 drop Both Eyes TID   Continuous Infusions:   LOS: 2 days    Time spent: 30 mins     Wyvonnia Dusky, MD [facility] Pager [phone]  If 7PM-7AM, please contact night-coverage [email or web address] 08/03/2019, 7:25 AM

## 2019-08-03 NOTE — Plan of Care (Signed)
  Problem: Education: Goal: Knowledge of the prescribed therapeutic regimen will improve Outcome: Progressing   Problem: Coping: Goal: Ability to identify and develop effective coping behavior will improve Outcome: Progressing   Problem: Clinical Measurements: Goal: Quality of life will improve Outcome: Progressing

## 2019-08-03 NOTE — Progress Notes (Signed)
[facility] Room 154 [facility]) Hospital Liaison RN note:  Visited with patient in room. She appears to be resting comfortably but arouses to verbal stimuli.  Hospice home does not have bed availability today. Spoke with daughter, [name] on the phone and she is aware.  [facility] Liaison will continue to follow and will notify all if room becomes available.  Please call with any hospice related questions or concerns.  Thank you.  Cyndra Numbers, RN [facility] Liaison [phone]

## 2019-08-04 DIAGNOSIS — I10 Essential (primary) hypertension: Secondary | ICD-10-CM

## 2019-08-04 NOTE — Progress Notes (Signed)
[facility] Liaison note:  Megan Donaldson is able to offer a bed today. Writer has spoken with patient's daughter [name] via telephone and she has accepted the bed.  Hospice home will be able to accept patient this EVENING with transport set for 8:30 pm. Writer to set up transport and call report.  Hospital care team updated. Signed out of facility DNR to accompany patient at discharge.  Thank you for the opportunity to be involved in the care of this patient and her family.  Dayna Barker BSN, RN, [facility] [employer] [phone]

## 2019-08-04 NOTE — Care Management Important Message (Signed)
Important Message  Patient Details  Name: Megan Donaldson MRN: [id number] Date of Birth: [birth year]   Medicare Important Message Given:  Other (see comment)  Patient on comfort care measures and awaiting Hospice Home bed. Out of respect for patient and family no Important Message given.  Olegario Messier A Faraz Ponciano 08/04/2019, 9:51 AM

## 2019-08-04 NOTE — Discharge Summary (Signed)
Physician Discharge Summary  Megan Donaldson TMA:[id number] DOB: Sep 10, 1927 DOA: 08/01/2019  PCP: Almetta Lovely, [facility]  Admit date: 08/01/2019 Discharge date: 08/04/2019  Admitted From: home Disposition: hospice home  Recommendations for Outpatient Follow-up:    Home Health: no Equipment/Devices:  Discharge Condition: hospice CODE STATUS: DNR/DNI  Diet recommendation: as tolerated  Brief/Interim Summary: HPI was taken from Dr. Para March: Megan Donaldson is a 86 y.o. female with medical history significant for dementia, HTN, HLD, history of PE not on blood thinners, recently hospitalized from 06/20/2019 to 06/24/2019 with hypotension of unclear etiology, ruled out for sepsis who presents to the emergency room following a fall at her residence that was unwitnessed.  Reported systolic blood pressure was 80.  History is limited due to patient's dementia.  Chart review reveals similar episode back in 2019 for which she was evaluated by EP.  Beta-blocker was held at that time due to sinus bradycardia. ED Course: On arrival in the emergency room blood pressure was initially 121/70, going as low as 98/70 but then picking back up.  Vitals were otherwise normal.  While in the emergency room she had a 13 beat run of V. tach during which she was asymptomatic.  EKG otherwise showed no acute ST-T wave changes.  Troponin was 12.  Magnesium was low at 1.6.  Potassium within normal limits at 3.6.  Hospitalist consulted for admission.  Hospital Course from Dr. Wilfred Lacy 6/16-6/18/21: Pt presented after an unwitnessed fall at home and was found to be hypotensive. Also, pt was evidently found to have an episode of ventricular tachycardia in the ER and the pt was asymptomatic. The previously hospitalist had a discussion w/ the pt's daughter who decided to proceed w/ comfort care only. Pt was d/c hospice home for further comfort care. Please see previous progress notes for more information.   Discharge Diagnoses:   Principal Problem:   Ventricular tachycardia, non-sustained (HCC) Active Problems:   Essential hypertension   History of pulmonary embolism   Hypotension   Dementia without behavioral disturbance (HCC)   Fall at home, initial encounter   NSVT (nonsustained ventricular tachycardia) (HCC)   Failure to thrive: continue w/ comfort care. Continue on IV morphine 1 mg Q8H and continue on prn morphine q1h   Ventricular tachycardia, non-sustained: noted to be hypotensive with systolic in the 80s, with 13 beat run of V. tach, asymptomatic while in the ER. S/p Mg given. Comfort care only   Hypotension: was previously on IVFs but pt's family decide to proceed w/ comfort care only   Fall: at home. Has small left frontal hematoma but head and C-spine CT with no acute findings. Fall precautions  Essential hypertension: hold home antihypertensives in view of hypotension  Chronic anemia: no need for a transfusion at this time   History of pulmonary embolism: not currently on anticoagulation as pt is on comfort care only   Dementia without behavioral disturbance: continue w/ supportive care  Discharge Instructions  Discharge Instructions    Diet general   Complete by: As directed    Discharge instructions   Complete by: As directed    Will be under hospice care at hospice home   Increase activity slowly   Complete by: As directed      Allergies as of 08/04/2019      Reactions   Amoxicillin    Has patient had a PCN reaction causing immediate rash, facial/tongue/throat swelling, SOB or lightheadedness with hypotension: Unknown Has patient had a PCN reaction causing  severe rash involving mucus membranes or skin necrosis: Unknown Has patient had a PCN reaction that required hospitalization: Unknown Has patient had a PCN reaction occurring within the last 10 years: Unknown If all of the above answers are "NO", then may proceed with Cephalosporin use.   Latex    Lisinopril     Mometasone Furoate       Medication List    TAKE these medications   acetaminophen 500 MG tablet Commonly known as: TYLENOL Take 1,000 mg by mouth 2 (two) times daily.   aspirin EC 81 MG tablet Take 81 mg by mouth daily.   Calcium 600-D 600-400 MG-UNIT Tabs Generic drug: Calcium Carbonate-Vitamin D3 Take 1 tablet by mouth 2 (two) times daily.   escitalopram 10 MG tablet Commonly known as: LEXAPRO Take 10 mg by mouth daily.   feeding supplement (ENSURE ENLIVE) Liqd Take 237 mLs by mouth 2 (two) times daily between meals.   ferrous sulfate 325 (65 FE) MG tablet Take 325 mg by mouth daily at 12 noon.   furosemide 20 MG tablet Commonly known as: LASIX Take 1 tablet (20 mg total) by mouth daily as needed for edema.   lamoTRIgine 25 MG tablet Commonly known as: LAMICTAL Take 37.5 mg by mouth daily.   lamoTRIgine 25 MG tablet Commonly known as: LAMICTAL Take 50 mg by mouth at bedtime.   levothyroxine 75 MCG tablet Commonly known as: SYNTHROID Take 75 mcg by mouth daily.   magnesium hydroxide 400 MG/5ML suspension Commonly known as: MILK OF MAGNESIA Take 30 mLs by mouth daily as needed for mild constipation.   melatonin 3 MG Tabs tablet Take 3 mg by mouth at bedtime.   multivitamin with minerals Tabs tablet Take 1 tablet by mouth daily.   nystatin cream Commonly known as: MYCOSTATIN Apply 1 application topically 2 (two) times daily as needed for dry skin. (apply to skin folds)   pantoprazole 40 MG tablet Commonly known as: PROTONIX Take 40 mg by mouth daily.   PEG 3350 17 g Pack Take 17 g by mouth daily as needed (moderate constipation).   senna-docusate 8.6-50 MG tablet Commonly known as: Senokot-S Take 2 tablets by mouth at bedtime.   simvastatin 40 MG tablet Commonly known as: ZOCOR Take 40 mg by mouth at bedtime.   traZODone 50 MG tablet Commonly known as: DESYREL Take 2 tablets (100 mg total) by mouth at bedtime as needed for sleep.   white  petrolatum Gel Commonly known as: VASELINE Apply 1 application topically 3 (three) times daily as needed for lip care.       Allergies  Allergen Reactions  . Amoxicillin     Has patient had a PCN reaction causing immediate rash, facial/tongue/throat swelling, SOB or lightheadedness with hypotension: Unknown Has patient had a PCN reaction causing severe rash involving mucus membranes or skin necrosis: Unknown Has patient had a PCN reaction that required hospitalization: Unknown Has patient had a PCN reaction occurring within the last 10 years: Unknown If all of the above answers are "NO", then may proceed with Cephalosporin use.   . Latex   . Lisinopril   . Mometasone Furoate     Consultations:  Hospice    Procedures/Studies: CT Head Wo Contrast  Result Date: 08/01/2019 CLINICAL DATA:  Unwitnessed fall EXAM: CT HEAD WITHOUT CONTRAST TECHNIQUE: Contiguous axial images were obtained from the base of the skull through the vertex without intravenous contrast. COMPARISON:  Jun 20, 2019 FINDINGS: Brain: No evidence of acute territorial infarction,  hemorrhage, hydrocephalus,extra-axial collection or mass lesion/mass effect. There is dilatation the ventricles and sulci consistent with age-related atrophy. Low-attenuation changes in the deep white matter consistent with small vessel ischemia. Vascular: No hyperdense vessel or unexpected calcification. Skull: The skull is intact. No fracture or focal lesion identified. Sinuses/Orbits: The visualized paranasal sinuses and mastoid air cells are clear. The orbits and globes intact. Other: Small soft tissue hematoma seen overlying the left frontal skull and posterior right parietal skull. Cervical spine: Alignment: There is straightening of the normal cervical lordosis. A minimal 2 mm anterolisthesis is seen of C7 on T1. Skull base and vertebrae: Visualized skull base is intact. No atlanto-occipital dissociation. The vertebral body heights are well  maintained. No fracture or pathologic osseous lesion seen. There is a loose body/rounded ossifications seen again posterior to the C2 vertebral body causing mild canal narrowing. Soft tissues and spinal canal: The visualized paraspinal soft tissues are unremarkable. No prevertebral soft tissue swelling is seen. The spinal canal is grossly unremarkable, no large epidural collection or significant canal narrowing. Disc levels: Multilevel cervical spine spondylosis is noted with disc osteophyte complex and uncovertebral osteophytes most notable at C5-C6 with moderate neural foraminal narrowing and mild central canal stenosis. Upper chest: The lung apices are clear. Thoracic inlet is within normal limits. Other: None IMPRESSION: No acute intracranial abnormality. Findings consistent with age related atrophy and chronic small vessel ischemia No acute fracture or malalignment of the spine. Cervical spine spondylosis most notable at C5-C6 as above. Electronically Signed   By: Jonna ClarkBindu  Avutu M.D.   On: 08/01/2019 02:42   CT Cervical Spine Wo Contrast  Result Date: 08/01/2019 CLINICAL DATA:  Unwitnessed fall EXAM: CT HEAD WITHOUT CONTRAST TECHNIQUE: Contiguous axial images were obtained from the base of the skull through the vertex without intravenous contrast. COMPARISON:  Jun 20, 2019 FINDINGS: Brain: No evidence of acute territorial infarction, hemorrhage, hydrocephalus,extra-axial collection or mass lesion/mass effect. There is dilatation the ventricles and sulci consistent with age-related atrophy. Low-attenuation changes in the deep white matter consistent with small vessel ischemia. Vascular: No hyperdense vessel or unexpected calcification. Skull: The skull is intact. No fracture or focal lesion identified. Sinuses/Orbits: The visualized paranasal sinuses and mastoid air cells are clear. The orbits and globes intact. Other: Small soft tissue hematoma seen overlying the left frontal skull and posterior right parietal  skull. Cervical spine: Alignment: There is straightening of the normal cervical lordosis. A minimal 2 mm anterolisthesis is seen of C7 on T1. Skull base and vertebrae: Visualized skull base is intact. No atlanto-occipital dissociation. The vertebral body heights are well maintained. No fracture or pathologic osseous lesion seen. There is a loose body/rounded ossifications seen again posterior to the C2 vertebral body causing mild canal narrowing. Soft tissues and spinal canal: The visualized paraspinal soft tissues are unremarkable. No prevertebral soft tissue swelling is seen. The spinal canal is grossly unremarkable, no large epidural collection or significant canal narrowing. Disc levels: Multilevel cervical spine spondylosis is noted with disc osteophyte complex and uncovertebral osteophytes most notable at C5-C6 with moderate neural foraminal narrowing and mild central canal stenosis. Upper chest: The lung apices are clear. Thoracic inlet is within normal limits. Other: None IMPRESSION: No acute intracranial abnormality. Findings consistent with age related atrophy and chronic small vessel ischemia No acute fracture or malalignment of the spine. Cervical spine spondylosis most notable at C5-C6 as above. Electronically Signed   By: Jonna ClarkBindu  Avutu M.D.   On: 08/01/2019 02:42   DG Knee Complete 4  Views Right  Result Date: 08/01/2019 CLINICAL DATA:  Knee pain, swelling, fall EXAM: RIGHT KNEE - COMPLETE 4+ VIEW COMPARISON:  11/28/2018 FINDINGS: Advanced degenerative changes, most pronounced in the medial and lateral compartments with complete joint space loss and spurring. Small joint effusion. Anterior soft tissue swelling. No fracture, subluxation or dislocation. IMPRESSION: Advanced degenerative changes within the right knee. Small joint effusion. Anterior soft tissue swelling. No acute bony abnormality. Electronically Signed   By: Charlett Nose M.D.   On: 08/01/2019 02:21     Subjective: Pt is very  lethargic   Discharge Exam: Vitals:   08/03/19 0731 08/04/19 0013  BP: (!) 88/54 (!) 89/51  Pulse: 91 81  Resp: 18 12  Temp: (!) 97.4 F (36.3 C) 98.3 F (36.8 C)  SpO2: 96% 97%   Vitals:   08/01/19 2318 08/02/19 0729 08/03/19 0731 08/04/19 0013  BP: (!) 95/50 (!) 74/59 (!) 88/54 (!) 89/51  Pulse: 63 73 91 81  Resp: 20 18 18 12   Temp: 98 F (36.7 C) 97.9 F (36.6 C) (!) 97.4 F (36.3 C) 98.3 F (36.8 C)  TempSrc: Axillary  Oral Oral  SpO2: 92% 97% 96% 97%  Weight:      Height:        General: lethargic Cardiovascular: S1/S2 +, no rubs, no gallops Respiratory: diminished breath sounds b/l  Abdominal: Soft, NT, ND, hypoactive bowel sounds + Extremities: no cyanosis    The results of significant diagnostics from this hospitalization (including imaging, microbiology, ancillary and laboratory) are listed below for reference.     Microbiology: No results found for this or any previous visit (from the past 240 hour(s)).   Labs: BNP (last 3 results) No results for input(s): BNP in the last 8760 hours. Basic Metabolic Panel: Recent Labs  Lab 08/01/19 0149  NA 138  K 3.6  CL 103  CO2 27  GLUCOSE 113*  BUN 19  CREATININE 1.09*  CALCIUM 9.6  MG 1.6*   Liver Function Tests: Recent Labs  Lab 08/01/19 0149  AST 31  ALT 17  ALKPHOS 68  BILITOT 0.7  PROT 6.1*  ALBUMIN 3.3*   No results for input(s): LIPASE, AMYLASE in the last 168 hours. No results for input(s): AMMONIA in the last 168 hours. CBC: Recent Labs  Lab 08/01/19 0149  WBC 6.2  NEUTROABS 3.4  HGB 8.9*  HCT 26.2*  MCV 91.3  PLT 194   Cardiac Enzymes: No results for input(s): CKTOTAL, CKMB, CKMBINDEX, TROPONINI in the last 168 hours. BNP: Invalid input(s): POCBNP CBG: No results for input(s): GLUCAP in the last 168 hours. D-Dimer No results for input(s): DDIMER in the last 72 hours. Hgb A1c No results for input(s): HGBA1C in the last 72 hours. Lipid Profile No results for  input(s): CHOL, HDL, LDLCALC, TRIG, CHOLHDL, LDLDIRECT in the last 72 hours. Thyroid function studies No results for input(s): TSH, T4TOTAL, T3FREE, THYROIDAB in the last 72 hours.  Invalid input(s): FREET3 Anemia work up No results for input(s): VITAMINB12, FOLATE, FERRITIN, TIBC, IRON, RETICCTPCT in the last 72 hours. Urinalysis    Component Value Date/Time   COLORURINE YELLOW (A) 06/20/2019 1118   APPEARANCEUR CLEAR (A) 06/20/2019 1118   APPEARANCEUR Hazy 10/25/2011 1219   LABSPEC 1.010 06/20/2019 1118   LABSPEC 1.023 10/25/2011 1219   PHURINE 7.0 06/20/2019 1118   GLUCOSEU NEGATIVE 06/20/2019 1118   GLUCOSEU Negative 10/25/2011 1219   HGBUR NEGATIVE 06/20/2019 1118   BILIRUBINUR NEGATIVE 06/20/2019 1118   BILIRUBINUR Negative 10/25/2011  1219   KETONESUR NEGATIVE 06/20/2019 1118   PROTEINUR NEGATIVE 06/20/2019 1118   NITRITE NEGATIVE 06/20/2019 1118   LEUKOCYTESUR NEGATIVE 06/20/2019 1118   LEUKOCYTESUR 1+ 10/25/2011 1219   Sepsis Labs Invalid input(s): PROCALCITONIN,  WBC,  LACTICIDVEN Microbiology No results found for this or any previous visit (from the past 240 hour(s)).   Time coordinating discharge: Over 30 minutes  SIGNED:   Charise Killian, MD  [facility] 08/04/2019, 12:30 PM Pager   If 7PM-7AM, please contact night-coverage [email or web address]

## 2019-08-04 NOTE — Progress Notes (Signed)
Continue  Waiting for ems to pick pt up at 2030.for the hospice home

## 2019-08-04 NOTE — Progress Notes (Signed)
Pt was picked up by EMS at 2105. IV removed. No distress at time of discharge

## 2019-08-04 NOTE — Progress Notes (Signed)
Pt to go to hospice home  Late this pm around 2030.  Morphine given/ mouth care frequently for dryness.  Repos/

## 2019-08-04 NOTE — Progress Notes (Signed)
[facility] Liaison note:  EMS notified for 8:30 pm pick up. Report called to the hospice home. Staff RN Baltazar Najjar aware. Thank you. Dayna Barker BSN, RN, [facility] [employer] [phone]

## 2019-08-04 NOTE — TOC Progression Note (Signed)
Transition of Care [facility]) - Progression Note    Patient Details  Name: Megan Donaldson MRN: [id number] Date of Birth: 1927-07-26  [facility]) CM/SW Contact  Barrie Dunker, RN Phone Number: 08/04/2019, 12:34 PM  Clinical Narrative:    The patient is to DC to Hospice Facility after 830 PM The bedside nurse is aware and the DC packet is on the chart,         Expected Discharge Plan and Services           Expected Discharge Date: 08/04/19                                     Social Determinants of Health (SDOH) Interventions    Readmission Risk Interventions Readmission Risk Prevention Plan 01/05/2019 11/13/2018  Medication Screening - Complete  Transportation Screening Complete Complete  HRI or Home Care Consult Complete -  Medication Review (RN Care Manager) Complete -  Some recent data might be hidden

## 2019-08-17 DEATH — deceased

## 2020-06-03 IMAGING — DX DG CHEST 1V PORT
1 series · 1 of 1 positions shown · non-contrast
Comparison: Radiograph 01/03/2019 CT chest 04/21/2010

CLINICAL DATA: Hypotension

EXAM:
PORTABLE CHEST 1 VIEW

[chest ap]
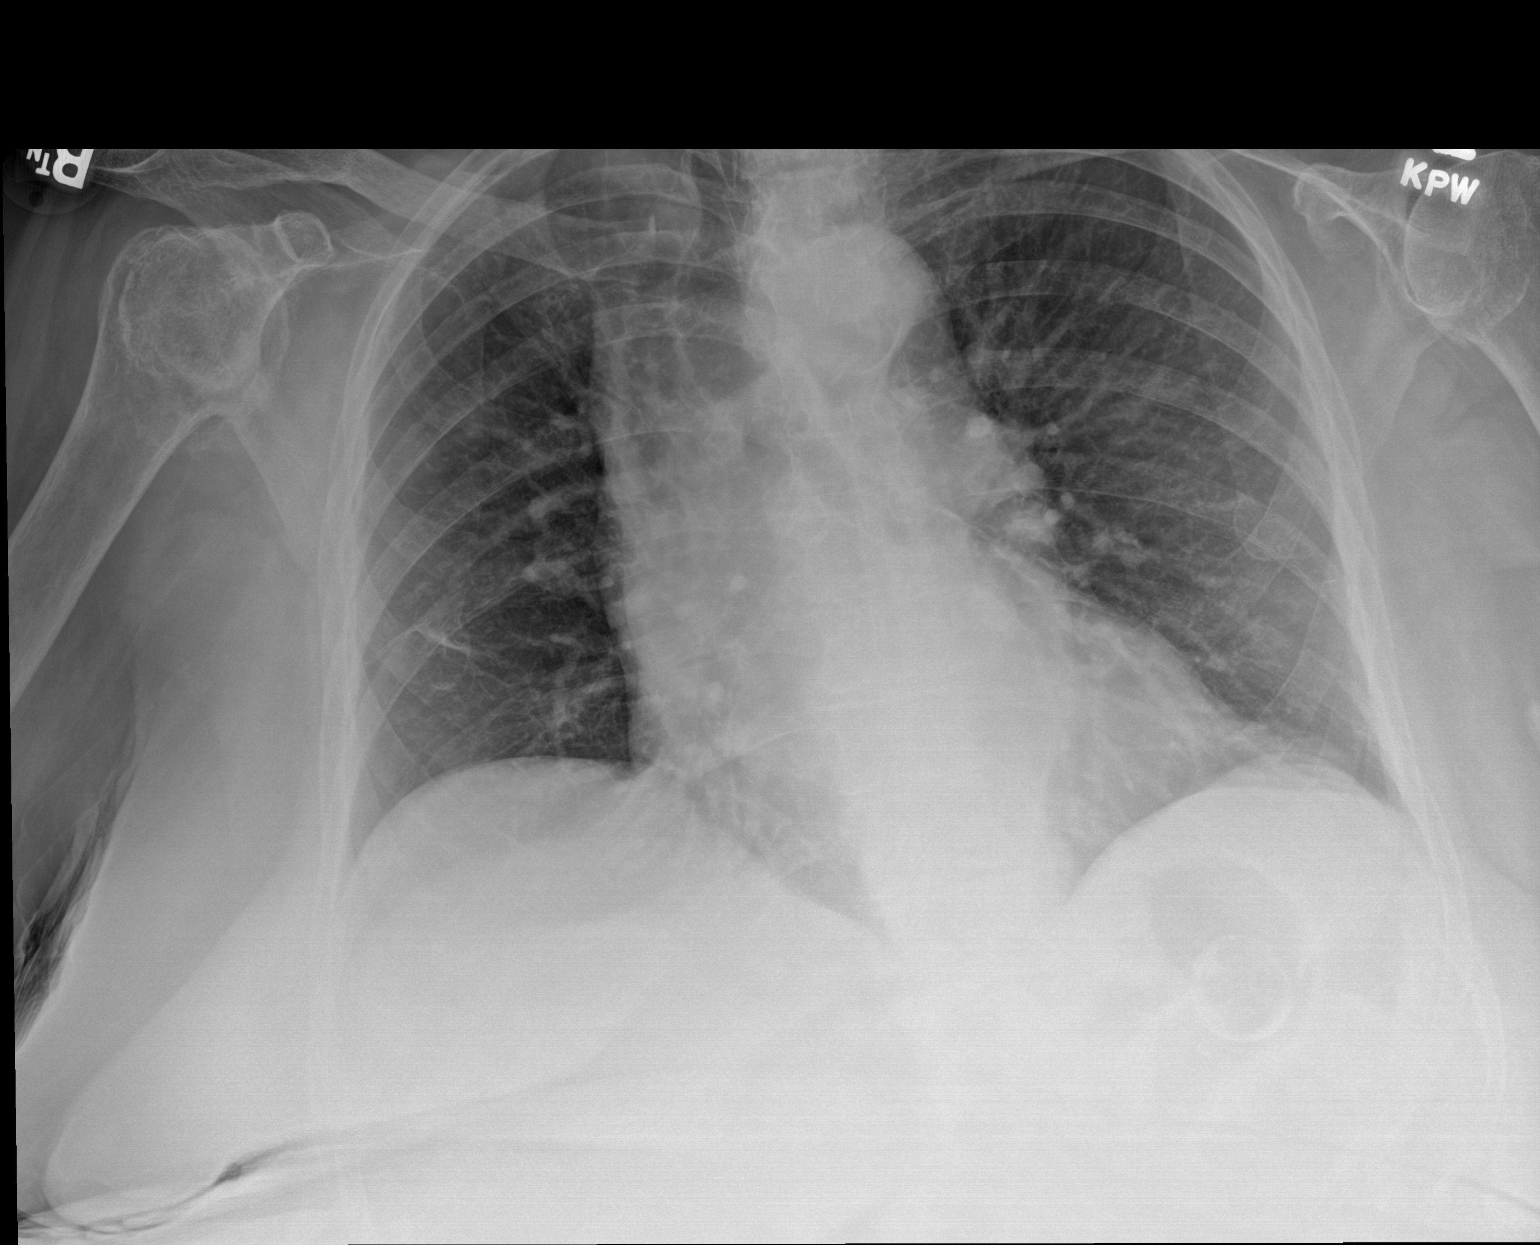

[1 of 1 positions shown; findings below may reference images not displayed]

FINDINGS: Bandlike area of scarring and/or atelectasis in the right mid lung.
Lungs are otherwise clear. No consolidation, features of edema,
pneumothorax, or effusion. The aorta is calcified. The remaining
cardiomediastinal contours are unremarkable. No acute osseous or
soft tissue abnormality. Degenerative changes are present in the
imaged spine and shoulders. Features are severe in the right
shoulder. Rounded calcification in the left upper quadrant seen
previously likely reflecting a large splenic artery aneurysm seen on
prior CT.
IMPRESSION: 1. Bandlike area of scarring and/or atelectasis in the right mid
lung.
2. No acute findings in the chest.
3. Calcified splenic artery aneurysm, similar to comparison is.

## 2020-12-07 IMAGING — CT CT CERVICAL SPINE W/O CM
2 series · 11 of 28 positions shown, 14 images · non-contrast
Comparison: June 20, 2019

CLINICAL DATA: Unwitnessed fall

EXAM:
CT HEAD WITHOUT CONTRAST
TECHNIQUE: Contiguous axial images were obtained from the base of the skull
through the vertex without intravenous contrast.

[Series 3: c spine soft · axial · 0.34mm/px · z∈[-253,-149]mm · 6 of 68 slices shown, 8 images]
[im 11/68  soft-tissue]
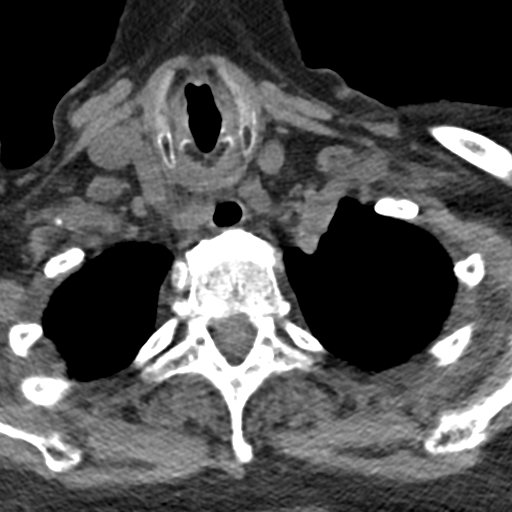
[im 11/68  bone]
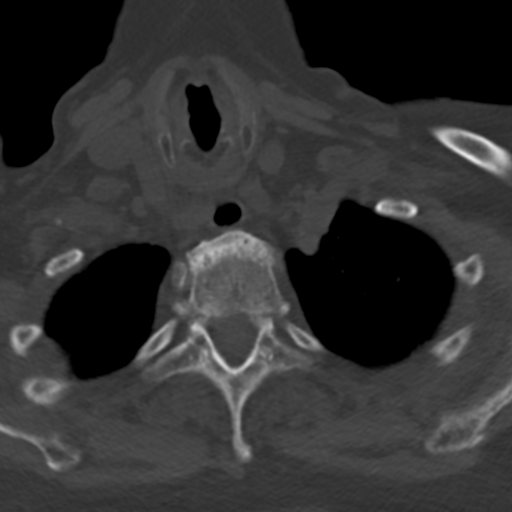
[im 21/68  bone]
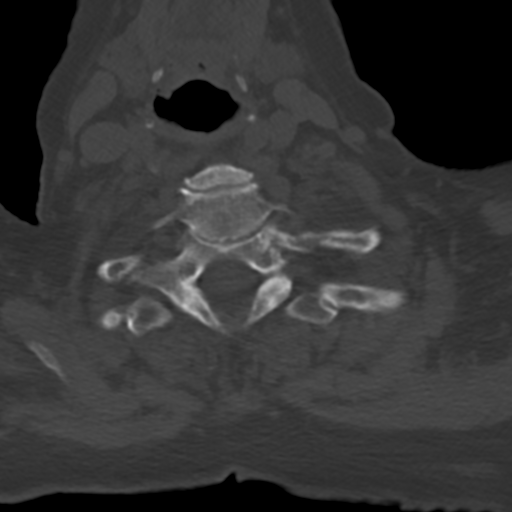
[im 31/68  bone]
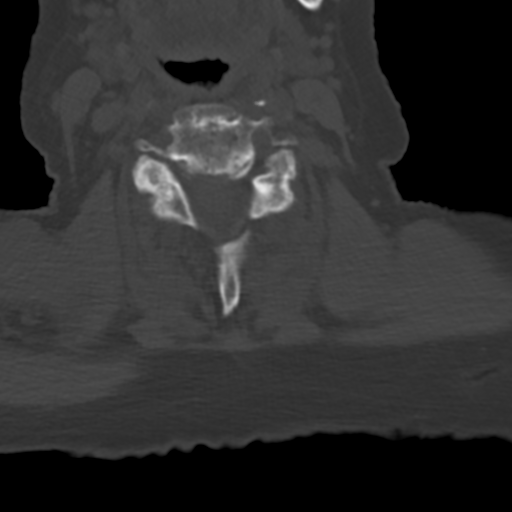
[im 42/68  bone]
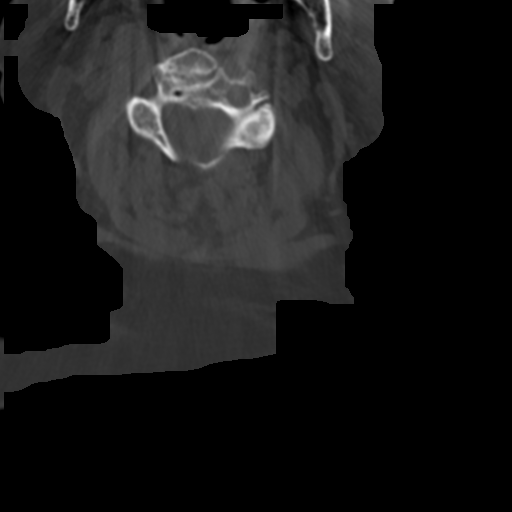
[im 52/68  soft-tissue]
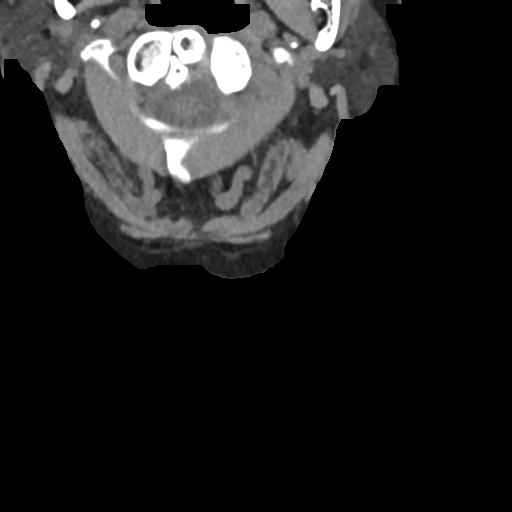
[im 52/68  bone]
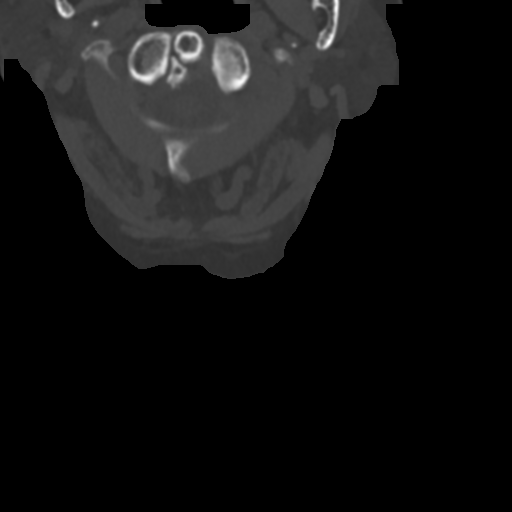
[im 62/68  bone]
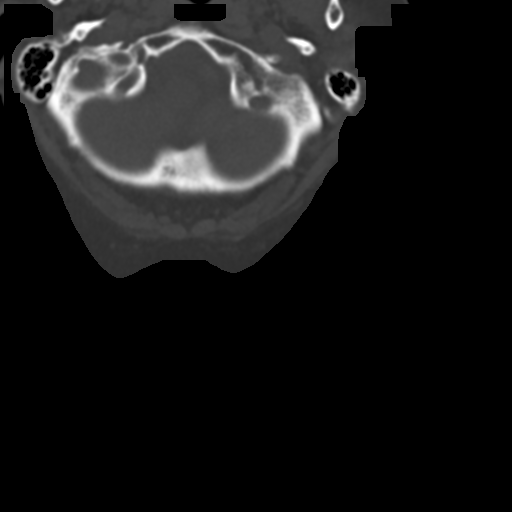

[Series 4: sagittal bone · sagittal · 0.22mm/px · 5 of 64 slices shown, 6 images]
[im 22/64  bone]
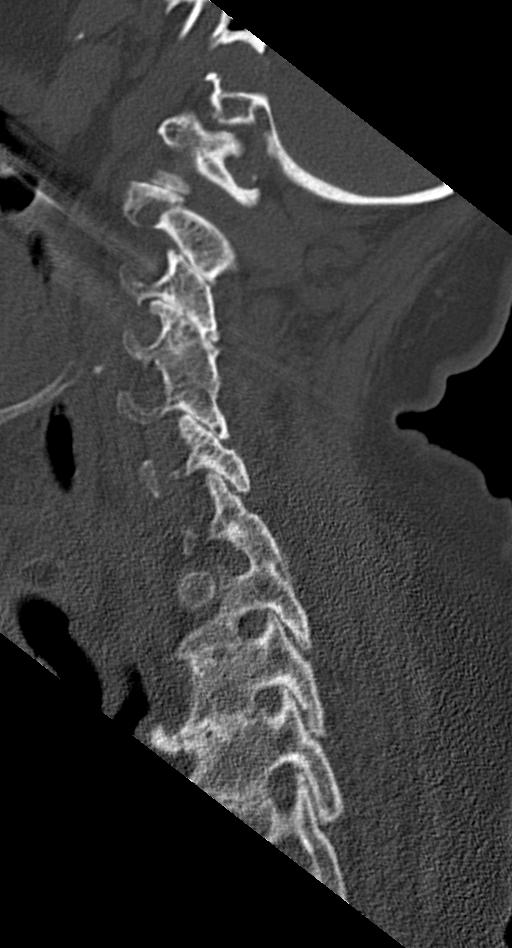
[im 27/64  bone]
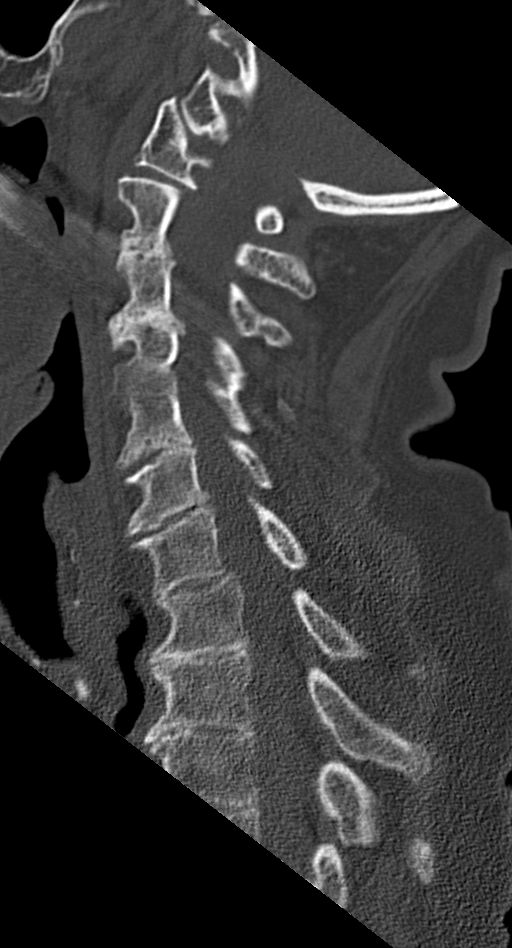
[im 32/64  soft-tissue]
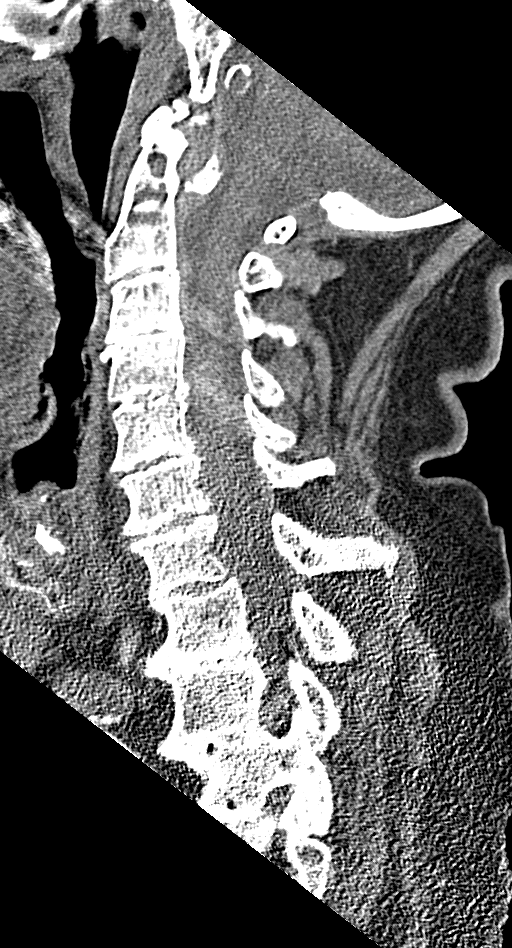
[im 32/64  bone]
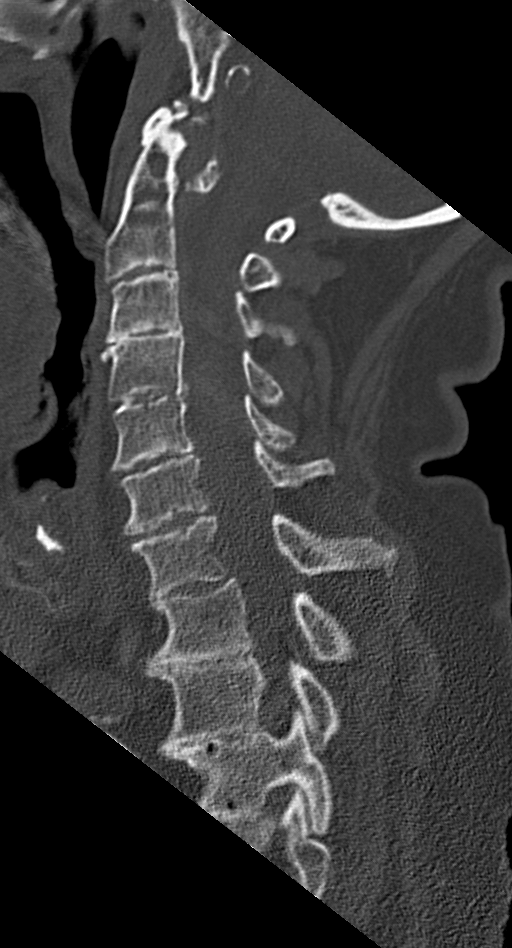
[im 37/64  bone]
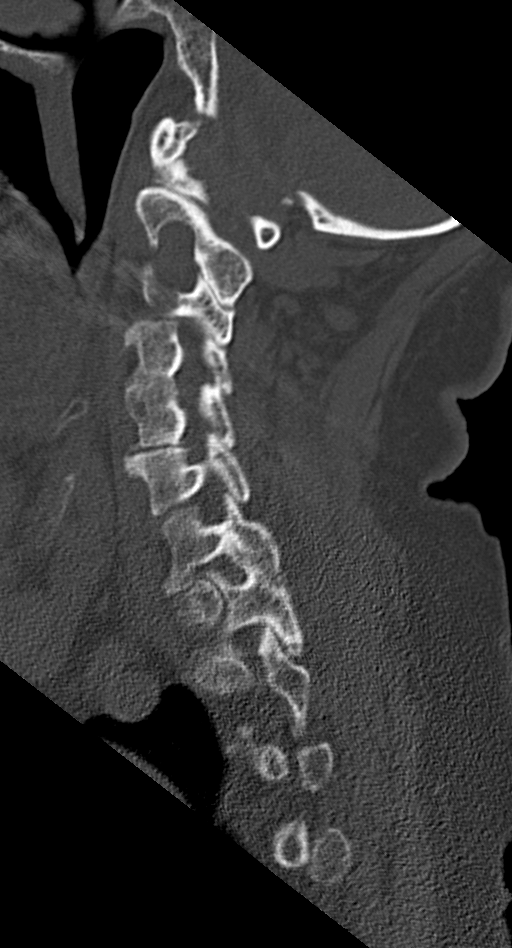
[im 43/64  bone]
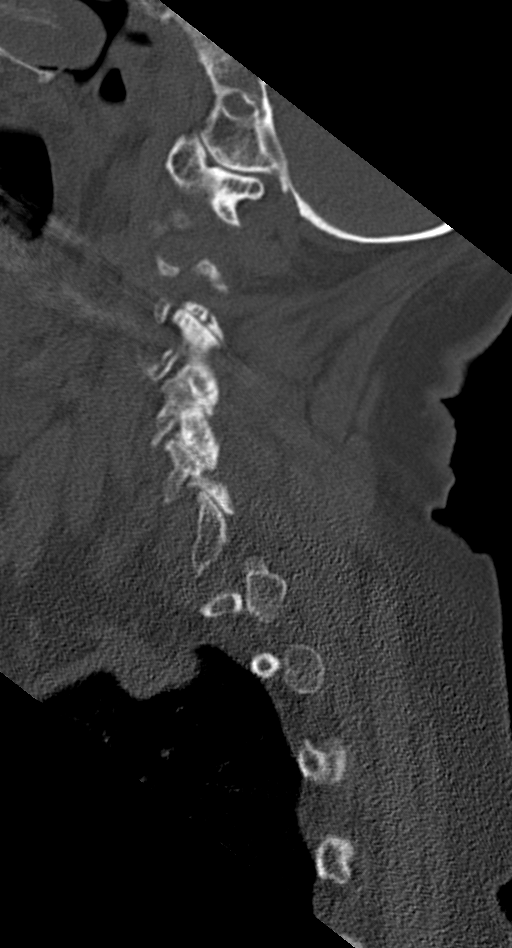

[11 of 28 positions shown; findings below may reference images not displayed]

FINDINGS: Brain: No evidence of acute territorial infarction, hemorrhage,
hydrocephalus,extra-axial collection or mass lesion/mass effect.
There is dilatation the ventricles and sulci consistent with
age-related atrophy. Low-attenuation changes in the deep white
matter consistent with small vessel ischemia.

Vascular: No hyperdense vessel or unexpected calcification.

Skull: The skull is intact. No fracture or focal lesion identified.

Sinuses/Orbits: The visualized paranasal sinuses and mastoid air
cells are clear. The orbits and globes intact.

Other: Small soft tissue hematoma seen overlying the left frontal
skull and posterior right parietal skull.

Cervical spine:

Alignment: There is straightening of the normal cervical lordosis. A
minimal 2 mm anterolisthesis is seen of C7 on T1.

Skull base and vertebrae: Visualized skull base is intact. No
atlanto-occipital dissociation. The vertebral body heights are well
maintained. No fracture or pathologic osseous lesion seen. There is
a loose body/rounded ossifications seen again posterior to the C2
vertebral body causing mild canal narrowing.

Soft tissues and spinal canal: The visualized paraspinal soft
tissues are unremarkable. No prevertebral soft tissue swelling is
seen. The spinal canal is grossly unremarkable, no large epidural
collection or significant canal narrowing.

Disc levels: Multilevel cervical spine spondylosis is noted with
disc osteophyte complex and uncovertebral osteophytes most notable
at C5-C6 with moderate neural foraminal narrowing and mild central
canal stenosis.

Upper chest: The lung apices are clear. Thoracic inlet is within
normal limits.

Other: None
IMPRESSION: No acute intracranial abnormality.

Findings consistent with age related atrophy and chronic small
vessel ischemia

No acute fracture or malalignment of the spine.

Cervical spine spondylosis most notable at C5-C6 as above.

## 2020-12-07 IMAGING — DX DG KNEE COMPLETE 4+V*R*
4 series · 4 of 4 positions shown · non-contrast
Comparison: 11/28/2018

CLINICAL DATA: Knee pain, swelling, fall

EXAM:
RIGHT KNEE - COMPLETE 4+ VIEW

[knee ap]
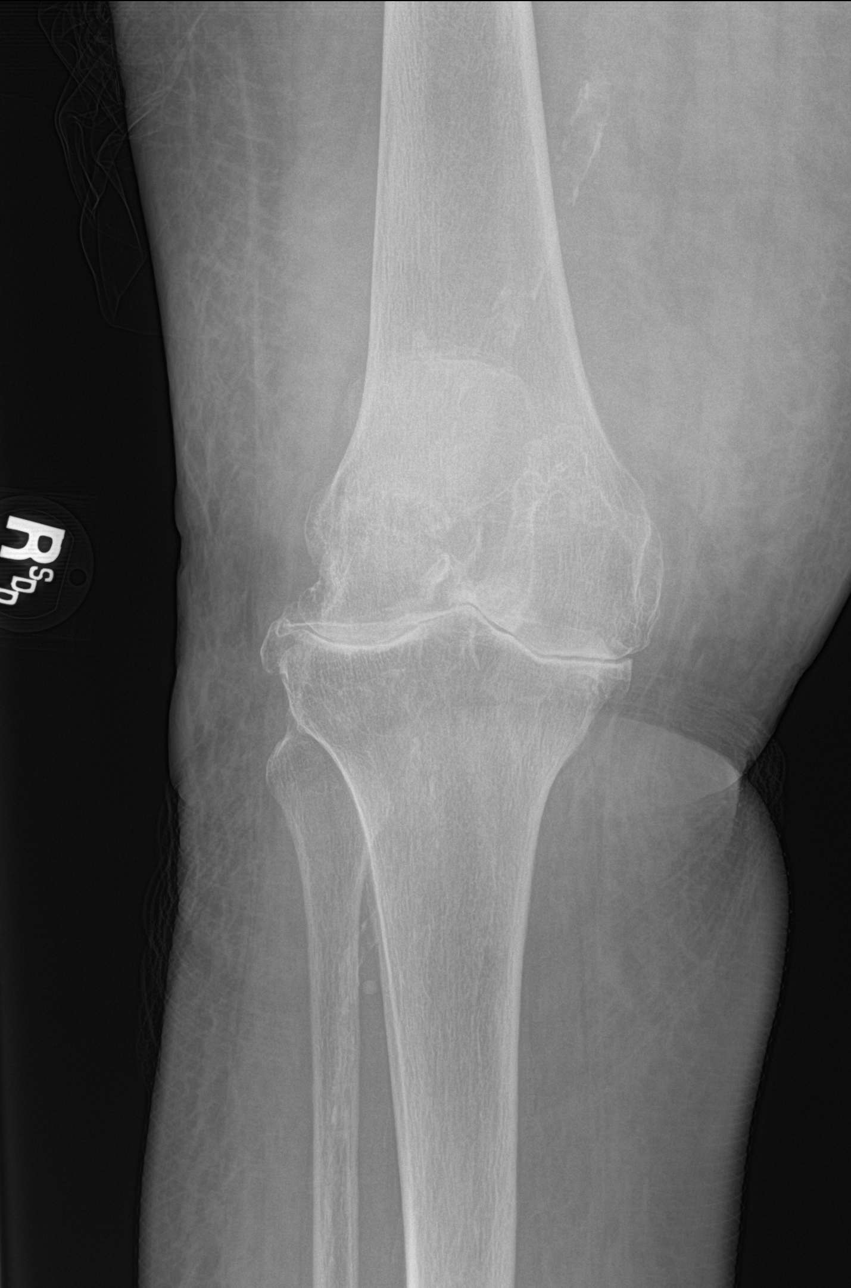

[knee lat]
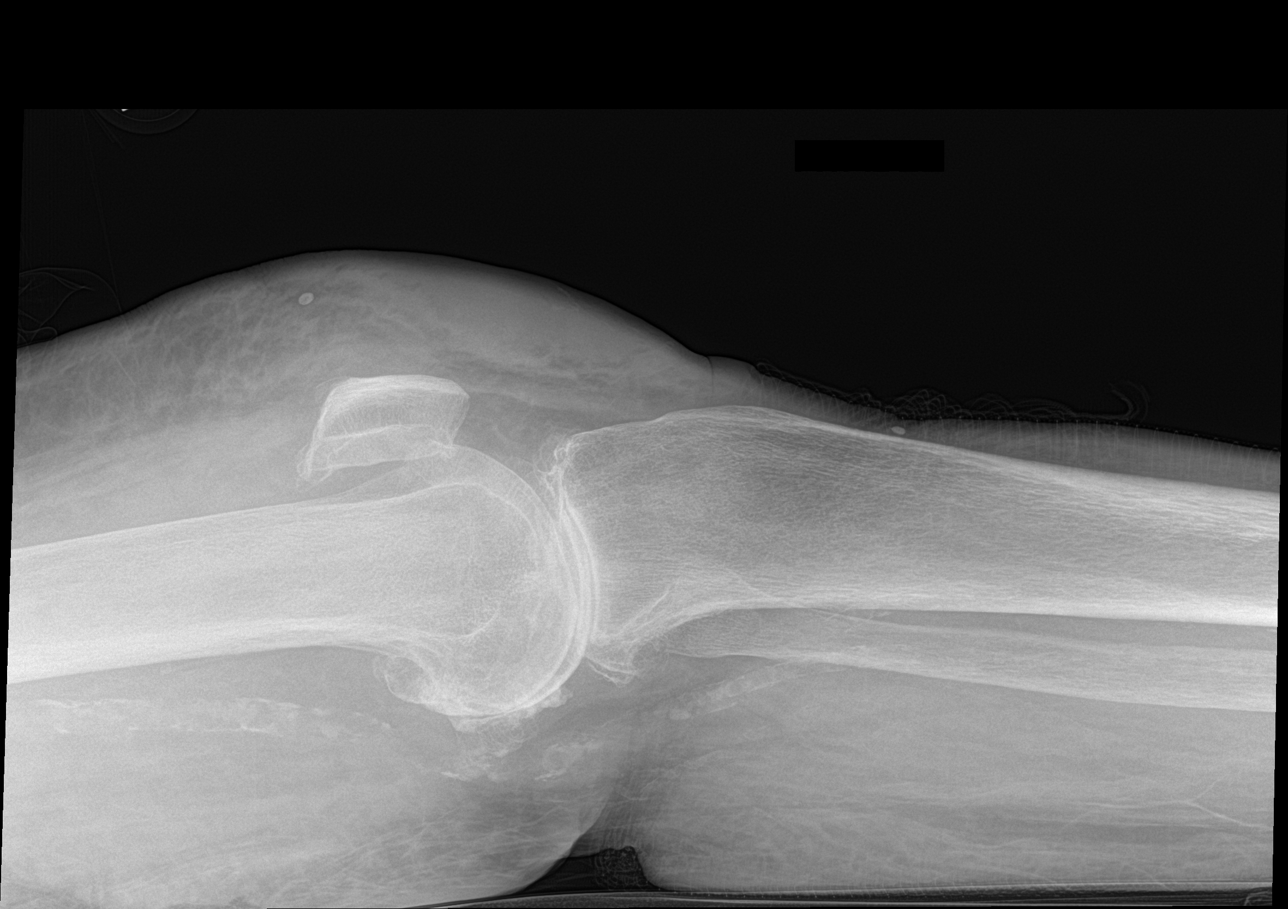

[knee obl (1 of 2)]
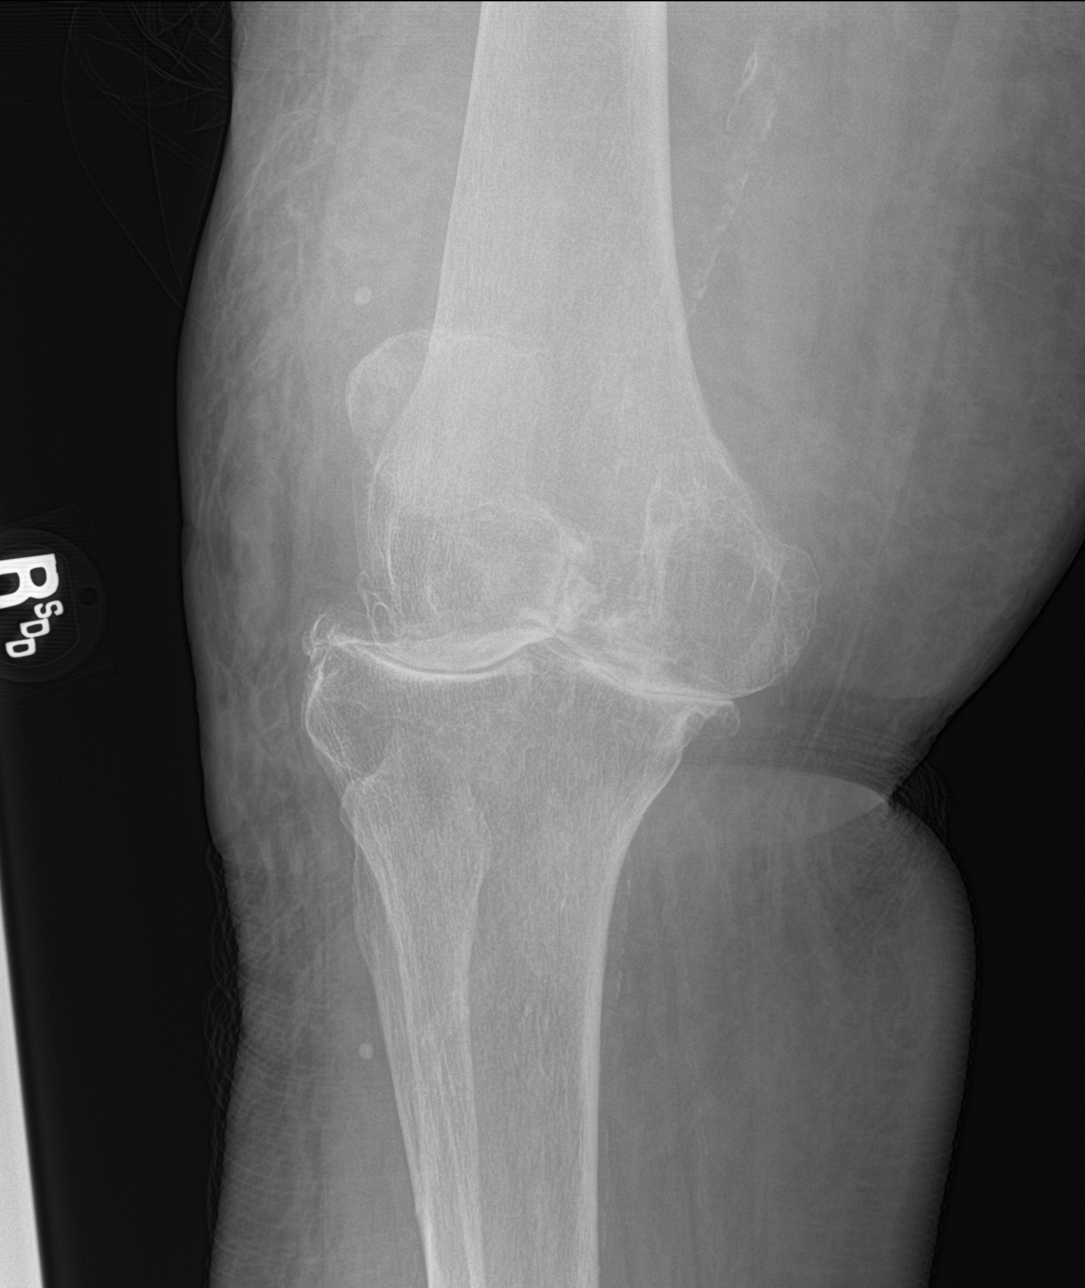

[knee obl (2 of 2)]
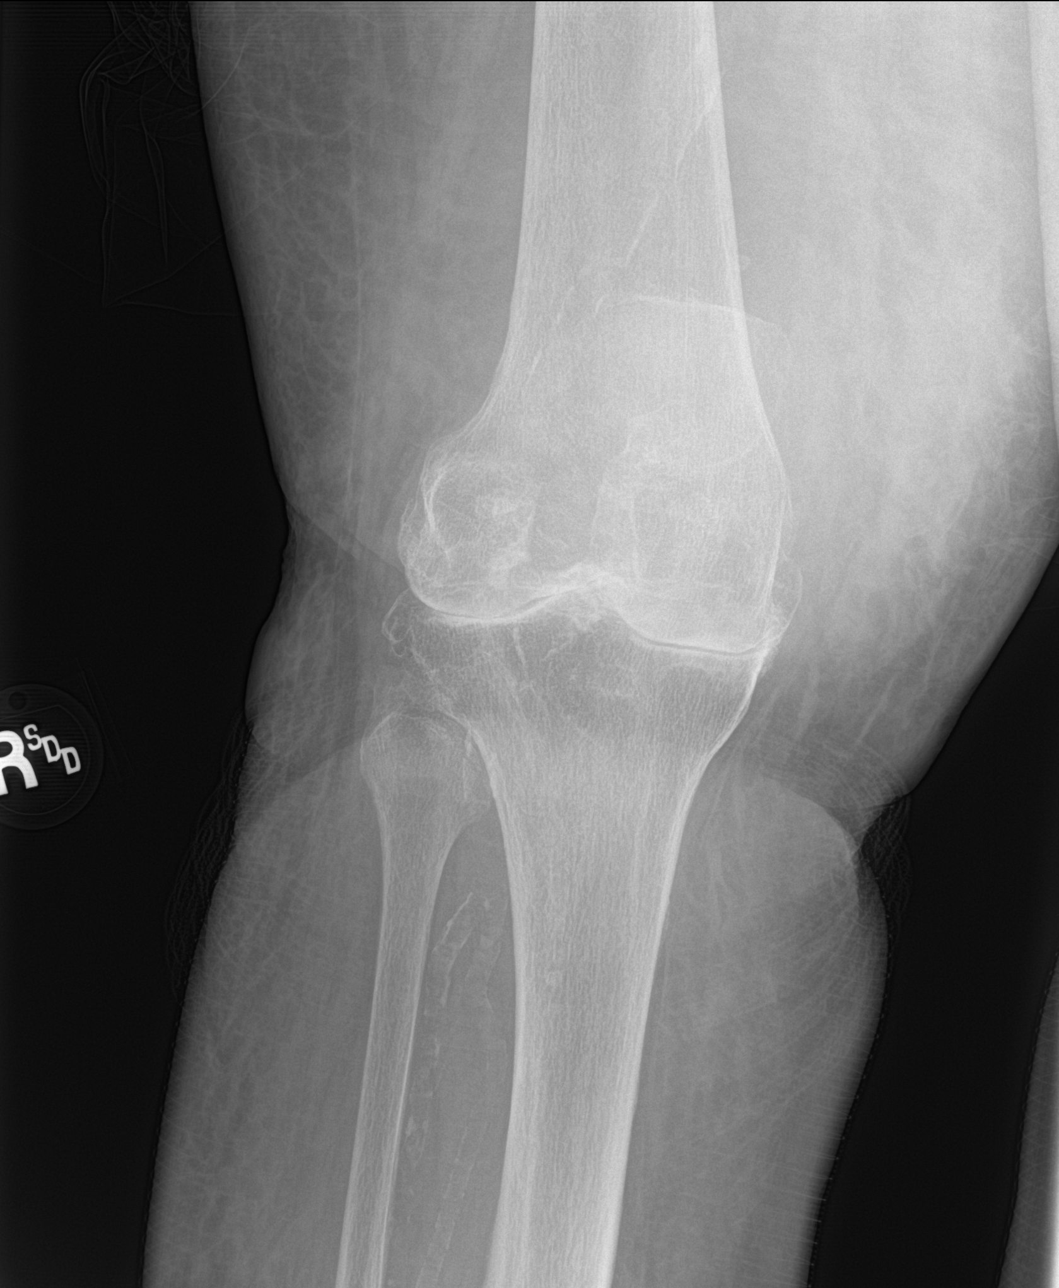

[4 of 4 positions shown; findings below may reference images not displayed]

FINDINGS: Advanced degenerative changes, most pronounced in the medial and
lateral compartments with complete joint space loss and spurring.
Small joint effusion. Anterior soft tissue swelling. No fracture,
subluxation or dislocation.
IMPRESSION: Advanced degenerative changes within the right knee. Small joint
effusion.

Anterior soft tissue swelling.

No acute bony abnormality.
